# Patient Record
Sex: Male | Born: 1935 | Race: White | Hispanic: No | Smoking: Never smoker
Health system: Southern US, Community
[De-identification: ages and names within clinical notes are randomized; demographics above are authoritative.]

## PROBLEM LIST (undated history)

## (undated) DIAGNOSIS — I10 Essential (primary) hypertension: Secondary | ICD-10-CM

## (undated) DIAGNOSIS — I2510 Atherosclerotic heart disease of native coronary artery without angina pectoris: Secondary | ICD-10-CM

## (undated) DIAGNOSIS — E785 Hyperlipidemia, unspecified: Secondary | ICD-10-CM

## (undated) DIAGNOSIS — H269 Unspecified cataract: Secondary | ICD-10-CM

## (undated) HISTORY — DX: Atherosclerotic heart disease of native coronary artery without angina pectoris: I25.10

## (undated) HISTORY — DX: Essential (primary) hypertension: I10

## (undated) HISTORY — DX: Hyperlipidemia, unspecified: E78.5

## (undated) HISTORY — DX: Unspecified cataract: H26.9

## (undated) HISTORY — PX: RECTAL SURGERY: SHX760

---

## 1980-01-31 HISTORY — PX: HERNIA REPAIR: SHX51

## 2005-11-14 ENCOUNTER — Ambulatory Visit: Admission: RE | Admit: 2005-11-14 | Discharge: 2006-01-09 | Payer: Self-pay | Admitting: Radiation Oncology

## 2011-02-03 DIAGNOSIS — C61 Malignant neoplasm of prostate: Secondary | ICD-10-CM | POA: Diagnosis not present

## 2011-02-10 DIAGNOSIS — R339 Retention of urine, unspecified: Secondary | ICD-10-CM | POA: Diagnosis not present

## 2011-02-10 DIAGNOSIS — C61 Malignant neoplasm of prostate: Secondary | ICD-10-CM | POA: Diagnosis not present

## 2011-02-10 DIAGNOSIS — N401 Enlarged prostate with lower urinary tract symptoms: Secondary | ICD-10-CM | POA: Diagnosis not present

## 2011-06-06 DIAGNOSIS — C61 Malignant neoplasm of prostate: Secondary | ICD-10-CM | POA: Diagnosis not present

## 2011-06-14 DIAGNOSIS — C61 Malignant neoplasm of prostate: Secondary | ICD-10-CM | POA: Diagnosis not present

## 2011-06-14 DIAGNOSIS — R339 Retention of urine, unspecified: Secondary | ICD-10-CM | POA: Diagnosis not present

## 2011-07-31 DIAGNOSIS — R310 Gross hematuria: Secondary | ICD-10-CM | POA: Diagnosis not present

## 2011-07-31 DIAGNOSIS — C61 Malignant neoplasm of prostate: Secondary | ICD-10-CM | POA: Diagnosis not present

## 2011-09-14 DIAGNOSIS — R310 Gross hematuria: Secondary | ICD-10-CM | POA: Diagnosis not present

## 2011-12-12 DIAGNOSIS — Z23 Encounter for immunization: Secondary | ICD-10-CM | POA: Diagnosis not present

## 2011-12-26 DIAGNOSIS — C61 Malignant neoplasm of prostate: Secondary | ICD-10-CM | POA: Diagnosis not present

## 2012-01-02 DIAGNOSIS — R339 Retention of urine, unspecified: Secondary | ICD-10-CM | POA: Diagnosis not present

## 2012-01-02 DIAGNOSIS — C61 Malignant neoplasm of prostate: Secondary | ICD-10-CM | POA: Diagnosis not present

## 2012-02-16 DIAGNOSIS — N529 Male erectile dysfunction, unspecified: Secondary | ICD-10-CM | POA: Diagnosis not present

## 2012-02-16 DIAGNOSIS — E7800 Pure hypercholesterolemia, unspecified: Secondary | ICD-10-CM | POA: Diagnosis not present

## 2012-02-16 DIAGNOSIS — Z79899 Other long term (current) drug therapy: Secondary | ICD-10-CM | POA: Diagnosis not present

## 2012-02-16 DIAGNOSIS — C61 Malignant neoplasm of prostate: Secondary | ICD-10-CM | POA: Diagnosis not present

## 2012-02-16 DIAGNOSIS — M1990 Unspecified osteoarthritis, unspecified site: Secondary | ICD-10-CM | POA: Diagnosis not present

## 2012-06-27 DIAGNOSIS — L570 Actinic keratosis: Secondary | ICD-10-CM | POA: Diagnosis not present

## 2012-06-27 DIAGNOSIS — C44621 Squamous cell carcinoma of skin of unspecified upper limb, including shoulder: Secondary | ICD-10-CM | POA: Diagnosis not present

## 2012-06-27 DIAGNOSIS — L578 Other skin changes due to chronic exposure to nonionizing radiation: Secondary | ICD-10-CM | POA: Diagnosis not present

## 2012-06-27 DIAGNOSIS — L821 Other seborrheic keratosis: Secondary | ICD-10-CM | POA: Diagnosis not present

## 2012-07-03 DIAGNOSIS — C61 Malignant neoplasm of prostate: Secondary | ICD-10-CM | POA: Diagnosis not present

## 2012-07-05 DIAGNOSIS — C44621 Squamous cell carcinoma of skin of unspecified upper limb, including shoulder: Secondary | ICD-10-CM | POA: Diagnosis not present

## 2012-07-10 DIAGNOSIS — C61 Malignant neoplasm of prostate: Secondary | ICD-10-CM | POA: Diagnosis not present

## 2012-07-10 DIAGNOSIS — N401 Enlarged prostate with lower urinary tract symptoms: Secondary | ICD-10-CM | POA: Diagnosis not present

## 2012-10-21 DIAGNOSIS — L570 Actinic keratosis: Secondary | ICD-10-CM | POA: Diagnosis not present

## 2012-11-11 DIAGNOSIS — J069 Acute upper respiratory infection, unspecified: Secondary | ICD-10-CM | POA: Diagnosis not present

## 2013-01-31 DIAGNOSIS — C61 Malignant neoplasm of prostate: Secondary | ICD-10-CM | POA: Diagnosis not present

## 2013-02-05 DIAGNOSIS — C61 Malignant neoplasm of prostate: Secondary | ICD-10-CM | POA: Diagnosis not present

## 2013-02-05 DIAGNOSIS — R339 Retention of urine, unspecified: Secondary | ICD-10-CM | POA: Diagnosis not present

## 2013-08-12 DIAGNOSIS — H2589 Other age-related cataract: Secondary | ICD-10-CM | POA: Diagnosis not present

## 2013-08-13 DIAGNOSIS — C61 Malignant neoplasm of prostate: Secondary | ICD-10-CM | POA: Diagnosis not present

## 2013-08-20 DIAGNOSIS — N139 Obstructive and reflux uropathy, unspecified: Secondary | ICD-10-CM | POA: Diagnosis not present

## 2013-08-20 DIAGNOSIS — N401 Enlarged prostate with lower urinary tract symptoms: Secondary | ICD-10-CM | POA: Diagnosis not present

## 2013-08-20 DIAGNOSIS — C61 Malignant neoplasm of prostate: Secondary | ICD-10-CM | POA: Diagnosis not present

## 2013-08-25 DIAGNOSIS — H52209 Unspecified astigmatism, unspecified eye: Secondary | ICD-10-CM | POA: Diagnosis not present

## 2013-08-25 DIAGNOSIS — H2589 Other age-related cataract: Secondary | ICD-10-CM | POA: Diagnosis not present

## 2013-08-25 DIAGNOSIS — H2510 Age-related nuclear cataract, unspecified eye: Secondary | ICD-10-CM | POA: Diagnosis not present

## 2013-09-01 DIAGNOSIS — H52209 Unspecified astigmatism, unspecified eye: Secondary | ICD-10-CM | POA: Diagnosis not present

## 2013-09-01 DIAGNOSIS — H2510 Age-related nuclear cataract, unspecified eye: Secondary | ICD-10-CM | POA: Diagnosis not present

## 2013-09-01 DIAGNOSIS — H2589 Other age-related cataract: Secondary | ICD-10-CM | POA: Diagnosis not present

## 2013-10-21 DIAGNOSIS — L723 Sebaceous cyst: Secondary | ICD-10-CM | POA: Diagnosis not present

## 2013-10-21 DIAGNOSIS — L821 Other seborrheic keratosis: Secondary | ICD-10-CM | POA: Diagnosis not present

## 2013-10-21 DIAGNOSIS — L820 Inflamed seborrheic keratosis: Secondary | ICD-10-CM | POA: Diagnosis not present

## 2013-10-21 DIAGNOSIS — L570 Actinic keratosis: Secondary | ICD-10-CM | POA: Diagnosis not present

## 2013-12-02 DIAGNOSIS — Z23 Encounter for immunization: Secondary | ICD-10-CM | POA: Diagnosis not present

## 2014-01-12 DIAGNOSIS — R110 Nausea: Secondary | ICD-10-CM | POA: Diagnosis not present

## 2014-01-12 DIAGNOSIS — R079 Chest pain, unspecified: Secondary | ICD-10-CM | POA: Diagnosis not present

## 2014-01-12 DIAGNOSIS — I10 Essential (primary) hypertension: Secondary | ICD-10-CM | POA: Diagnosis not present

## 2014-01-12 DIAGNOSIS — R072 Precordial pain: Secondary | ICD-10-CM | POA: Diagnosis not present

## 2014-01-12 DIAGNOSIS — R0602 Shortness of breath: Secondary | ICD-10-CM | POA: Diagnosis not present

## 2014-01-13 DIAGNOSIS — E785 Hyperlipidemia, unspecified: Secondary | ICD-10-CM | POA: Diagnosis present

## 2014-01-13 DIAGNOSIS — R0602 Shortness of breath: Secondary | ICD-10-CM | POA: Diagnosis not present

## 2014-01-13 DIAGNOSIS — I25119 Atherosclerotic heart disease of native coronary artery with unspecified angina pectoris: Secondary | ICD-10-CM | POA: Diagnosis not present

## 2014-01-13 DIAGNOSIS — I248 Other forms of acute ischemic heart disease: Secondary | ICD-10-CM | POA: Diagnosis not present

## 2014-01-13 DIAGNOSIS — R079 Chest pain, unspecified: Secondary | ICD-10-CM | POA: Diagnosis not present

## 2014-01-13 DIAGNOSIS — I208 Other forms of angina pectoris: Secondary | ICD-10-CM | POA: Diagnosis not present

## 2014-01-13 DIAGNOSIS — I10 Essential (primary) hypertension: Secondary | ICD-10-CM | POA: Diagnosis not present

## 2014-01-13 DIAGNOSIS — R0789 Other chest pain: Secondary | ICD-10-CM | POA: Diagnosis not present

## 2014-01-13 DIAGNOSIS — R110 Nausea: Secondary | ICD-10-CM | POA: Diagnosis not present

## 2014-01-13 DIAGNOSIS — I209 Angina pectoris, unspecified: Secondary | ICD-10-CM | POA: Diagnosis present

## 2014-01-14 DIAGNOSIS — I248 Other forms of acute ischemic heart disease: Secondary | ICD-10-CM | POA: Diagnosis not present

## 2014-01-14 DIAGNOSIS — I25119 Atherosclerotic heart disease of native coronary artery with unspecified angina pectoris: Secondary | ICD-10-CM | POA: Diagnosis not present

## 2014-01-14 DIAGNOSIS — I208 Other forms of angina pectoris: Secondary | ICD-10-CM | POA: Diagnosis not present

## 2014-01-15 DIAGNOSIS — I25119 Atherosclerotic heart disease of native coronary artery with unspecified angina pectoris: Secondary | ICD-10-CM | POA: Diagnosis not present

## 2014-01-15 DIAGNOSIS — I208 Other forms of angina pectoris: Secondary | ICD-10-CM | POA: Diagnosis not present

## 2014-01-19 DIAGNOSIS — C61 Malignant neoplasm of prostate: Secondary | ICD-10-CM | POA: Diagnosis not present

## 2014-01-19 DIAGNOSIS — R7301 Impaired fasting glucose: Secondary | ICD-10-CM | POA: Diagnosis not present

## 2014-01-19 DIAGNOSIS — E780 Pure hypercholesterolemia: Secondary | ICD-10-CM | POA: Diagnosis not present

## 2014-01-19 DIAGNOSIS — Z0000 Encounter for general adult medical examination without abnormal findings: Secondary | ICD-10-CM | POA: Diagnosis not present

## 2014-01-19 DIAGNOSIS — N521 Erectile dysfunction due to diseases classified elsewhere: Secondary | ICD-10-CM | POA: Diagnosis not present

## 2014-01-19 DIAGNOSIS — I25119 Atherosclerotic heart disease of native coronary artery with unspecified angina pectoris: Secondary | ICD-10-CM | POA: Diagnosis not present

## 2014-01-19 DIAGNOSIS — Z79899 Other long term (current) drug therapy: Secondary | ICD-10-CM | POA: Diagnosis not present

## 2014-01-28 DIAGNOSIS — E785 Hyperlipidemia, unspecified: Secondary | ICD-10-CM | POA: Diagnosis not present

## 2014-01-28 DIAGNOSIS — I209 Angina pectoris, unspecified: Secondary | ICD-10-CM | POA: Diagnosis not present

## 2014-01-28 DIAGNOSIS — I2510 Atherosclerotic heart disease of native coronary artery without angina pectoris: Secondary | ICD-10-CM | POA: Diagnosis not present

## 2014-02-10 DIAGNOSIS — H02831 Dermatochalasis of right upper eyelid: Secondary | ICD-10-CM | POA: Diagnosis not present

## 2014-02-10 DIAGNOSIS — H02834 Dermatochalasis of left upper eyelid: Secondary | ICD-10-CM | POA: Diagnosis not present

## 2014-02-17 DIAGNOSIS — C61 Malignant neoplasm of prostate: Secondary | ICD-10-CM | POA: Diagnosis not present

## 2014-02-25 DIAGNOSIS — R3916 Straining to void: Secondary | ICD-10-CM | POA: Diagnosis not present

## 2014-02-25 DIAGNOSIS — N401 Enlarged prostate with lower urinary tract symptoms: Secondary | ICD-10-CM | POA: Diagnosis not present

## 2014-02-25 DIAGNOSIS — C61 Malignant neoplasm of prostate: Secondary | ICD-10-CM | POA: Diagnosis not present

## 2014-03-02 DIAGNOSIS — H02831 Dermatochalasis of right upper eyelid: Secondary | ICD-10-CM | POA: Diagnosis not present

## 2014-03-02 DIAGNOSIS — H02834 Dermatochalasis of left upper eyelid: Secondary | ICD-10-CM | POA: Diagnosis not present

## 2014-06-01 DIAGNOSIS — I2510 Atherosclerotic heart disease of native coronary artery without angina pectoris: Secondary | ICD-10-CM | POA: Diagnosis not present

## 2014-06-01 DIAGNOSIS — E785 Hyperlipidemia, unspecified: Secondary | ICD-10-CM | POA: Diagnosis not present

## 2014-06-03 DIAGNOSIS — E785 Hyperlipidemia, unspecified: Secondary | ICD-10-CM | POA: Diagnosis not present

## 2014-08-26 DIAGNOSIS — C61 Malignant neoplasm of prostate: Secondary | ICD-10-CM | POA: Diagnosis not present

## 2014-09-09 DIAGNOSIS — R339 Retention of urine, unspecified: Secondary | ICD-10-CM | POA: Diagnosis not present

## 2014-09-09 DIAGNOSIS — C61 Malignant neoplasm of prostate: Secondary | ICD-10-CM | POA: Diagnosis not present

## 2014-10-22 DIAGNOSIS — L821 Other seborrheic keratosis: Secondary | ICD-10-CM | POA: Diagnosis not present

## 2014-10-22 DIAGNOSIS — L570 Actinic keratosis: Secondary | ICD-10-CM | POA: Diagnosis not present

## 2014-10-22 DIAGNOSIS — L578 Other skin changes due to chronic exposure to nonionizing radiation: Secondary | ICD-10-CM | POA: Diagnosis not present

## 2014-12-07 DIAGNOSIS — H04223 Epiphora due to insufficient drainage, bilateral lacrimal glands: Secondary | ICD-10-CM | POA: Diagnosis not present

## 2014-12-07 DIAGNOSIS — H02102 Unspecified ectropion of right lower eyelid: Secondary | ICD-10-CM | POA: Diagnosis not present

## 2014-12-17 DIAGNOSIS — I2510 Atherosclerotic heart disease of native coronary artery without angina pectoris: Secondary | ICD-10-CM | POA: Diagnosis not present

## 2014-12-17 DIAGNOSIS — I208 Other forms of angina pectoris: Secondary | ICD-10-CM | POA: Diagnosis not present

## 2014-12-17 DIAGNOSIS — E785 Hyperlipidemia, unspecified: Secondary | ICD-10-CM | POA: Diagnosis not present

## 2014-12-28 DIAGNOSIS — L574 Cutis laxa senilis: Secondary | ICD-10-CM | POA: Diagnosis not present

## 2014-12-28 DIAGNOSIS — H02102 Unspecified ectropion of right lower eyelid: Secondary | ICD-10-CM | POA: Diagnosis not present

## 2015-03-19 DIAGNOSIS — C61 Malignant neoplasm of prostate: Secondary | ICD-10-CM | POA: Diagnosis not present

## 2015-03-26 DIAGNOSIS — C61 Malignant neoplasm of prostate: Secondary | ICD-10-CM | POA: Diagnosis not present

## 2015-03-26 DIAGNOSIS — R3914 Feeling of incomplete bladder emptying: Secondary | ICD-10-CM | POA: Diagnosis not present

## 2015-03-26 DIAGNOSIS — Z0000 Encounter for general adult medical examination without abnormal findings: Secondary | ICD-10-CM | POA: Diagnosis not present

## 2015-03-26 DIAGNOSIS — N401 Enlarged prostate with lower urinary tract symptoms: Secondary | ICD-10-CM | POA: Diagnosis not present

## 2015-03-26 DIAGNOSIS — R338 Other retention of urine: Secondary | ICD-10-CM | POA: Diagnosis not present

## 2015-06-15 DIAGNOSIS — Z0000 Encounter for general adult medical examination without abnormal findings: Secondary | ICD-10-CM | POA: Diagnosis not present

## 2015-06-15 DIAGNOSIS — N390 Urinary tract infection, site not specified: Secondary | ICD-10-CM | POA: Diagnosis not present

## 2015-06-15 DIAGNOSIS — R310 Gross hematuria: Secondary | ICD-10-CM | POA: Diagnosis not present

## 2015-07-12 DIAGNOSIS — E785 Hyperlipidemia, unspecified: Secondary | ICD-10-CM | POA: Diagnosis not present

## 2015-07-12 DIAGNOSIS — I2510 Atherosclerotic heart disease of native coronary artery without angina pectoris: Secondary | ICD-10-CM | POA: Diagnosis not present

## 2015-07-12 DIAGNOSIS — I208 Other forms of angina pectoris: Secondary | ICD-10-CM | POA: Diagnosis not present

## 2015-08-23 DIAGNOSIS — R310 Gross hematuria: Secondary | ICD-10-CM | POA: Diagnosis not present

## 2015-08-25 DIAGNOSIS — N200 Calculus of kidney: Secondary | ICD-10-CM | POA: Diagnosis not present

## 2015-08-25 DIAGNOSIS — R310 Gross hematuria: Secondary | ICD-10-CM | POA: Diagnosis not present

## 2015-09-15 ENCOUNTER — Telehealth: Payer: Self-pay | Admitting: Internal Medicine

## 2015-09-15 NOTE — Telephone Encounter (Signed)
Received records from [facility] for appointment on 09/29/15 with Dr Debara Pickett.  Records given to [facility] (medical records) for Dr [facility] schedule on 09/29/15. lp

## 2015-09-22 DIAGNOSIS — C61 Malignant neoplasm of prostate: Secondary | ICD-10-CM | POA: Diagnosis not present

## 2015-09-29 ENCOUNTER — Encounter: Payer: Self-pay | Admitting: Internal Medicine

## 2015-09-29 ENCOUNTER — Telehealth: Payer: Self-pay | Admitting: Internal Medicine

## 2015-09-29 ENCOUNTER — Encounter (INDEPENDENT_AMBULATORY_CARE_PROVIDER_SITE_OTHER): Payer: Self-pay

## 2015-09-29 ENCOUNTER — Ambulatory Visit (INDEPENDENT_AMBULATORY_CARE_PROVIDER_SITE_OTHER): Payer: Medicare Other | Admitting: Internal Medicine

## 2015-09-29 VITALS — BP 112/70 | HR 58 | Ht 69.0 in | Wt 197.0 lb

## 2015-09-29 DIAGNOSIS — I2510 Atherosclerotic heart disease of native coronary artery without angina pectoris: Secondary | ICD-10-CM | POA: Insufficient documentation

## 2015-09-29 DIAGNOSIS — I712 Thoracic aortic aneurysm, without rupture, unspecified: Secondary | ICD-10-CM

## 2015-09-29 DIAGNOSIS — Z79899 Other long term (current) drug therapy: Secondary | ICD-10-CM

## 2015-09-29 DIAGNOSIS — R338 Other retention of urine: Secondary | ICD-10-CM | POA: Diagnosis not present

## 2015-09-29 DIAGNOSIS — E785 Hyperlipidemia, unspecified: Secondary | ICD-10-CM

## 2015-09-29 DIAGNOSIS — R310 Gross hematuria: Secondary | ICD-10-CM | POA: Diagnosis not present

## 2015-09-29 DIAGNOSIS — N401 Enlarged prostate with lower urinary tract symptoms: Secondary | ICD-10-CM | POA: Diagnosis not present

## 2015-09-29 DIAGNOSIS — C61 Malignant neoplasm of prostate: Secondary | ICD-10-CM | POA: Diagnosis not present

## 2015-09-29 HISTORY — DX: Thoracic aortic aneurysm, without rupture, unspecified: I71.20

## 2015-09-29 HISTORY — DX: Thoracic aortic aneurysm, without rupture: I71.2

## 2015-09-29 HISTORY — DX: Atherosclerotic heart disease of native coronary artery without angina pectoris: I25.10

## 2015-09-29 NOTE — Progress Notes (Signed)
OFFICE NOTE  Chief Complaint:  Establish cardiologist  Primary Care Physician: Pcp Not In System  HPI:  Chad Byrd is a 82 y.o. male who was previously followed by Dr. Danie Binder with [facility] regional physicians. In December 2015 he presented with acute chest pain and underwent cardiac catheterization. This demonstrated severe proximal circumflex stenosis that was high risk for intervention at a bifurcation. At the time he was on no medication and was placed on a statin, long-acting nitrate and metoprolol. Since then he has been chest pain-free. Unfortunately he had significant side effects including severe muscle weakness and fatigue from pravastatin and discontinued this medication. In the past he's been able to take red [place] without any problems. Recently he has had problems with some hematuria and saw Dr. Alinda Money with urology who ordered a CT of the abdomen and pelvis. This demonstrated aneurysmal dilatation of the descending thoracic aorta measuring 4.9 cm in diameter. The entire thoracic or aorta was not visualized as this was an abdominal study, and it was recommended that repeat CT angiography or MRA would be performed in 6-12 months. 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 He is not get any more short of breath or have any chest pain symptoms with exertion. He reports good control over his blood pressure. He's not had lipid testing in some time.  PMHx:  Past Medical History:  Diagnosis Date  . Cataract   . Hyperlipidemia     Past Surgical History:  Procedure Laterality Date  . HERNIA REPAIR  1982  . RECTAL SURGERY  mid 1990s    FAMHx:  History reviewed. No pertinent family history. Parents died of "old age"  SOCHx:   reports that he has never smoked. He has never used smokeless tobacco. He reports that he drinks alcohol. He reports that he does not use drugs.  ALLERGIES:  No Known Allergies  ROS: Pertinent  items noted in HPI and remainder of comprehensive ROS otherwise negative.  HOME MEDS: Current Outpatient Prescriptions  Medication Sig Dispense Refill  . aspirin EC 81 MG tablet Take 81 mg by mouth daily.    . isosorbide mononitrate (IMDUR) 30 MG 24 hr tablet Take 30 mg by mouth daily.    . metoprolol succinate (TOPROL-XL) 25 MG 24 hr tablet Take 25 mg by mouth daily.     No current facility-administered medications for this visit.     LABS/IMAGING: No results found for this or any previous visit (from the past 48 hour(s)). No results found.  WEIGHTS: Wt Readings from Last 3 Encounters:  09/29/15 197 lb (89.4 kg)    VITALS: BP 112/70   Pulse (!) 58   Ht 5\' 9"  (1.753 m)   Wt 197 lb (89.4 kg)   BMI 29.09 kg/m   EXAM: General appearance: alert and no distress Neck: no carotid bruit and no JVD Lungs: clear to auscultation bilaterally Heart: regular rate and rhythm Abdomen: soft, non-tender; bowel sounds normal; no masses,  no organomegaly Extremities: extremities normal, atraumatic, no cyanosis or edema Pulses: 2+ and symmetric Skin: Skin color, texture, turgor normal. No rashes or lesions Neurologic: Grossly normal Psych: Pleasant  EKG: Sinus bradycardia 58, left anterior fascicular block, minimal voltage criteria for LVH, nonspecific ST and T-wave changes.  ASSESSMENT: 1. Coronary artery disease with moderate to severe circumflex bifurcation disease (12/2013) managed medically 2. Thoracic aortic aneurysm measuring 4.9 cm 3. Dyslipidemia-statin intolerant  PLAN: 1.   Mr. Kuczek  has medically managed coronary artery disease and has done very well since his car a catheterization in 2015. Unfortunately he has had intolerance to statin medications and has likely not been optimized with regards to his cholesterol. I like to recheck a lipid profile and would probably recommend adding steady. He could take red [place] rice and she tolerated this as well. Ultimately, he may be  candidate for PCSK9 therapy. With regards to his thoracic aortic aneurysm, this was visualized on an abdominal CT and therefore he will need complete imaging of the chest. We'll plan this in 6 months since we don't know the timeline of his aneurysm change and to more closely assess the ascending thoracic aorta. Follow-up with me at that time.  Thanks for the kind referral.  Pixie Casino, MD, [facility] Attending Cardiologist [place] 09/29/2015, 1:05 PM

## 2015-09-29 NOTE — Patient Instructions (Addendum)
Your physician recommends that you return for lab work FASTING to check cholesterol, comprehensive metabolic panel   Dr. Debara Pickett has ordered a CT test to be done in 6 months @ [address].   Your physician wants you to follow-up in: 6 months with Dr. Debara Pickett - after your CT.  You will receive a reminder letter in the mail two months in advance. If you don't receive a letter, please call our office to schedule the follow-up appointment.

## 2015-09-29 NOTE — Telephone Encounter (Signed)
Faxed Release, signed by patient, to [facility] to obtain records per Dr [facility] request.

## 2015-10-01 DIAGNOSIS — Z79899 Other long term (current) drug therapy: Secondary | ICD-10-CM | POA: Diagnosis not present

## 2015-10-01 DIAGNOSIS — I2510 Atherosclerotic heart disease of native coronary artery without angina pectoris: Secondary | ICD-10-CM | POA: Diagnosis not present

## 2015-10-01 LAB — COMPREHENSIVE METABOLIC PANEL
ALT: 16 U/L (ref 9–46)
AST: 22 U/L (ref 10–35)
Albumin: 3.9 g/dL (ref 3.6–5.1)
Alkaline Phosphatase: 53 U/L (ref 40–115)
BUN: 18 mg/dL (ref 7–25)
CHLORIDE: 104 mmol/L (ref 98–110)
CO2: 26 mmol/L (ref 20–31)
Calcium: 9.1 mg/dL (ref 8.6–10.3)
Creat: 1.3 mg/dL — ABNORMAL HIGH (ref 0.70–1.11)
Glucose, Bld: 87 mg/dL (ref 65–99)
POTASSIUM: 5.2 mmol/L (ref 3.5–5.3)
Sodium: 141 mmol/L (ref 135–146)
TOTAL PROTEIN: 6.3 g/dL (ref 6.1–8.1)
Total Bilirubin: 0.5 mg/dL (ref 0.2–1.2)

## 2015-10-05 LAB — CARDIO IQ(R) ADVANCED LIPID PANEL
Apolipoprotein B: 122 mg/dL — ABNORMAL HIGH (ref 52–109)
CHOLESTEROL, TOTAL (CARDIO IQ ADV LIPID PANEL): 203 mg/dL — AB (ref <200)
CHOLESTEROL/HDL RATIO (CARDIO IQ ADV LIPID PANEL): 5.6 calc — AB (ref <5.0)
HDL Cholesterol: 36 mg/dL — ABNORMAL LOW (ref >40)
LDL CHOLESTEROL CALCULATED (CARDIO IQ ADV LIPID PANEL): 142 mg/dL — AB (ref <100)
LDL Large: 4918 nmol/L (ref 4334–10815)
LDL Medium: 384 nmol/L (ref 167–465)
LDL PEAK SIZE: 211.5 Angstrom — AB (ref >=218.2)
LDL Particle Number: 2008 nmol/L (ref 1016–2185)
LDL SMALL: 567 nmol/L — AB (ref 123–441)
Lipoprotein (a): 76 nmol/L — ABNORMAL HIGH (ref <75)
NON-HDL CHOLESTEROL (CARDIO IQ ADV LIPID PANEL): 167 mg/dL — AB (ref <130)
TRIGLYCERIDES (CARDIO IQ ADV LIPID PANEL): 127 mg/dL (ref <150)

## 2015-10-06 ENCOUNTER — Other Ambulatory Visit: Payer: Self-pay | Admitting: *Deleted

## 2015-10-06 ENCOUNTER — Encounter: Payer: Self-pay | Admitting: Internal Medicine

## 2015-10-06 DIAGNOSIS — E785 Hyperlipidemia, unspecified: Secondary | ICD-10-CM

## 2015-10-06 MED ORDER — EZETIMIBE 10 MG PO TABS: 10.0000 mg | ORAL_TABLET | Freq: Every day | ORAL | 5 refills | Status: DC

## 2015-10-06 NOTE — Progress Notes (Signed)
Patient was not scheduled to come back for 6 months after his CT. Note mentioned he may be PCSK-9 candidate?

## 2015-10-08 ENCOUNTER — Telehealth: Payer: Self-pay | Admitting: Internal Medicine

## 2015-10-08 NOTE — Telephone Encounter (Signed)
Received records from [facility] as requested by Dr Debara Pickett.  Records given to Dr Debara Pickett for review. lp

## 2015-10-21 DIAGNOSIS — L821 Other seborrheic keratosis: Secondary | ICD-10-CM | POA: Diagnosis not present

## 2015-10-21 DIAGNOSIS — L918 Other hypertrophic disorders of the skin: Secondary | ICD-10-CM | POA: Diagnosis not present

## 2015-10-21 DIAGNOSIS — L578 Other skin changes due to chronic exposure to nonionizing radiation: Secondary | ICD-10-CM | POA: Diagnosis not present

## 2015-10-21 DIAGNOSIS — L570 Actinic keratosis: Secondary | ICD-10-CM | POA: Diagnosis not present

## 2016-03-06 ENCOUNTER — Ambulatory Visit
Admission: RE | Admit: 2016-03-06 | Discharge: 2016-03-06 | Disposition: A | Discharge status: Home / Self Care | Payer: Medicare Other | Source: Ambulatory Visit | Attending: Internal Medicine | Admitting: Internal Medicine

## 2016-03-06 DIAGNOSIS — I712 Thoracic aortic aneurysm, without rupture, unspecified: Secondary | ICD-10-CM

## 2016-03-06 MED ORDER — IOPAMIDOL (ISOVUE-370) INJECTION 76%
80.0000 mL | Freq: Once | INTRAVENOUS | Status: AC | PRN
  Administered 2016-03-06: 80 mL via INTRAVENOUS

## 2016-03-07 ENCOUNTER — Ambulatory Visit (INDEPENDENT_AMBULATORY_CARE_PROVIDER_SITE_OTHER): Payer: Medicare Other | Admitting: Internal Medicine

## 2016-03-07 ENCOUNTER — Encounter: Payer: Self-pay | Admitting: Internal Medicine

## 2016-03-07 VITALS — BP 142/70 | HR 56 | Ht 69.0 in | Wt 204.0 lb

## 2016-03-07 DIAGNOSIS — I712 Thoracic aortic aneurysm, without rupture, unspecified: Secondary | ICD-10-CM

## 2016-03-07 DIAGNOSIS — Z789 Other specified health status: Secondary | ICD-10-CM | POA: Diagnosis not present

## 2016-03-07 DIAGNOSIS — E782 Mixed hyperlipidemia: Secondary | ICD-10-CM

## 2016-03-07 DIAGNOSIS — I2510 Atherosclerotic heart disease of native coronary artery without angina pectoris: Secondary | ICD-10-CM | POA: Diagnosis not present

## 2016-03-07 HISTORY — DX: Other specified health status: Z78.9

## 2016-03-07 MED ORDER — LISINOPRIL 10 MG PO TABS: 10.0000 mg | ORAL_TABLET | Freq: Every day | ORAL | 3 refills | Status: DC

## 2016-03-07 NOTE — Progress Notes (Signed)
OFFICE NOTE  Chief Complaint:  Establish cardiologist  Primary Care Physician: Pcp Not In System  HPI:  Chad Byrd is a 83 y.o. male who was previously followed by Dr. Danie Binder with [facility] regional physicians. In December 2015 he presented with acute chest pain and underwent cardiac catheterization. This demonstrated severe proximal circumflex stenosis that was high risk for intervention at a bifurcation. At the time he was on no medication and was placed on a statin, long-acting nitrate and metoprolol. Since then he has been chest pain-free. Unfortunately he had significant side effects including severe muscle weakness and fatigue from pravastatin and discontinued this medication. In the past he's been able to take red [place] rice without any problems. Recently he has had problems with some hematuria and saw Dr. Alinda Money with urology who ordered a CT of the abdomen and pelvis. This demonstrated aneurysmal dilatation of the descending thoracic aorta measuring 4.9 cm in diameter. The entire thoracic or aorta was not visualized as this was an abdominal study, and it was recommended that repeat CT angiography or MRA would be performed in 6-12 months. 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 He is not get any more short of breath or have any chest pain symptoms with exertion. He reports good control over his blood pressure. He's not had lipid testing in some time.  03/07/2016  Chad Byrd returns today for follow-up. He had a repeat CT scan demonstrating interval decrease in the size of his ascending thoracic aortic aneurysm from 4.9 cm to 4.4 cm. I suspect this is due to mis-measurement. Blood pressure was noted to be somewhat elevated today 142/70. Given his aneurysm, strict blood pressure control is important. He is on metoprolol succinate 25 mg daily. He has a history of statin intolerance failing 8 atorvastatin and simvastatin in the past.  I started him on ezetimibe and he had a predictable decrease in LDL cholesterol to 111 based on recent testing. This is however very far from goal LDL of less than 70 or lower.  PMHx:  Past Medical History:  Diagnosis Date  . Cataract   . Hyperlipidemia     Past Surgical History:  Procedure Laterality Date  . HERNIA REPAIR  1982  . RECTAL SURGERY  mid 1990s    FAMHx:  Family History  Problem Relation Age of Onset  . Alzheimer's disease Brother   . Lung cancer Sister    Parents died of "old age"  SOCHx:   reports that he has never smoked. He has never used smokeless tobacco. He reports that he drinks alcohol. He reports that he does not use drugs.  ALLERGIES:  No Known Allergies  ROS: Pertinent items noted in HPI and remainder of comprehensive ROS otherwise negative.  HOME MEDS: Current Outpatient Prescriptions  Medication Sig Dispense Refill  . aspirin EC 81 MG tablet Take 81 mg by mouth daily.    [name] ezetimibe (ZETIA) 10 MG tablet Take 10 mg by mouth daily.    . isosorbide mononitrate (IMDUR) 30 MG 24 hr tablet Take 30 mg by mouth daily.    . metoprolol succinate (TOPROL-XL) 25 MG 24 hr tablet Take 25 mg by mouth daily.    [name] lisinopril (PRINIVIL,ZESTRIL) 10 MG tablet Take 1 tablet (10 mg total) by mouth daily. 90 tablet 3   No current facility-administered medications for this visit.     LABS/IMAGING: No results found for this or any previous visit (from the  past 48 hour(s)). Ct Angio Chest Aorta W &/or Wo Contrast  Result Date: 03/06/2016 CLINICAL DATA:  Thoracic aortic aneurysm, follow-up. EXAM: CT ANGIOGRAPHY CHEST WITH CONTRAST TECHNIQUE: Multidetector CT imaging of the chest was performed using the standard protocol during bolus administration of intravenous contrast. Multiplanar CT image reconstructions and MIPs were obtained to evaluate the vascular anatomy. CONTRAST:  80 cc Isovue 370 IV COMPARISON:  None. FINDINGS: Cardiovascular: Mild aneurysmal dilatation of  the ascending thoracic aorta, measuring maximally 4.4 cm. Tortuosity of the thoracic aorta. Mild-to-moderate calcified and noncalcified plaque in the descending thoracic aorta without aneurysm. No filling defects in the pulmonary arteries to suggest pulmonary emboli. Heart is borderline in size. Scattered moderate coronary artery calcifications. Mediastinum/Nodes: Calcified mediastinal and right hilar lymph nodes compatible with old granulomatous disease. No adenopathy. Lungs/Pleura: Calcified granuloma in the right middle lobe. No suspicious pulmonary nodules. No confluent opacities or effusions. Upper Abdomen: No acute findings in the upper abdomen. Few small layering gallstones in the gallbladder. Musculoskeletal: Chest wall soft tissues are unremarkable. No acute bony abnormality. Review of the MIP images confirms the above findings. IMPRESSION: 4.4 cm ascending thoracic aortic aneurysm. Recommend annual imaging followup by CTA or MRA. This recommendation follows 2010 ACCF/AHA/AATS/ACR/ASA/SCA/SCAI/SIR/STS/SVM Guidelines for the Diagnosis and Management of Patients with Thoracic Aortic Disease. Circulation. 2010; 121: [id number] Old granulomatous disease. Moderate coronary artery disease. Electronically Signed   By: Rolm Baptise M.D.   On: 03/06/2016 12:05    WEIGHTS: Wt Readings from Last 3 Encounters:  03/07/16 204 lb (92.5 kg)  09/29/15 197 lb (89.4 kg)    VITALS: BP (!) 142/70   Pulse (!) 56   Ht 5\' 9"  (1.753 m)   Wt 204 lb (92.5 kg)   BMI 30.13 kg/m   EXAM: General appearance: alert and no distress Neck: no carotid bruit and no JVD Lungs: clear to auscultation bilaterally Heart: regular rate and rhythm Abdomen: soft, non-tender; bowel sounds normal; no masses,  no organomegaly Extremities: extremities normal, atraumatic, no cyanosis or edema Pulses: 2+ and symmetric Skin: Skin color, texture, turgor normal. No rashes or lesions Neurologic: Grossly normal Psych:  Pleasant  EKG: Sinus bradycardia 56  ASSESSMENT: 1. Coronary artery disease with moderate to severe circumflex bifurcation disease (12/2013) managed medically 2. Thoracic aortic aneurysm measuring 4.4 cm (2018) 3. Dyslipidemia-statin intolerant  PLAN: 1.   Chad Byrd has known coronary artery disease and thoracic aortic aneurysm and unfortunate has been statin intolerant. He seems to tolerate ezetimibe however LDL-C is 111. He would clearly benefit from the addition of a PCSK9 inhibitor or possibly enrollment in the [place]-10 trial, which randomizes patients to inclisirin (siRNA to PSCK9) versus placebo. I discussed the trial further with him today and he seems agreeable to participate. We will refer him to our research nurses. Finally, as blood pressures not at goal, will add lisinopril 10 mg daily to his regimen. Repeat blood pressure during a return office visit in one month with me.  Pixie Casino, MD, [facility] Attending Cardiologist [place] 03/07/2016, 5:49 PM

## 2016-03-07 NOTE — Patient Instructions (Signed)
Your physician has recommended you make the following change in your medication: START lisinopril 10mg  once daily  Your physician recommends that you schedule a follow-up appointment in: [place] with Dr. Debara Pickett

## 2016-03-22 DIAGNOSIS — C61 Malignant neoplasm of prostate: Secondary | ICD-10-CM | POA: Diagnosis not present

## 2016-03-29 DIAGNOSIS — R350 Frequency of micturition: Secondary | ICD-10-CM | POA: Diagnosis not present

## 2016-03-29 DIAGNOSIS — N312 Flaccid neuropathic bladder, not elsewhere classified: Secondary | ICD-10-CM | POA: Diagnosis not present

## 2016-03-29 DIAGNOSIS — C61 Malignant neoplasm of prostate: Secondary | ICD-10-CM | POA: Diagnosis not present

## 2016-03-29 DIAGNOSIS — N401 Enlarged prostate with lower urinary tract symptoms: Secondary | ICD-10-CM | POA: Diagnosis not present

## 2016-04-06 ENCOUNTER — Encounter: Payer: Self-pay | Admitting: Internal Medicine

## 2016-04-06 ENCOUNTER — Ambulatory Visit (INDEPENDENT_AMBULATORY_CARE_PROVIDER_SITE_OTHER): Payer: Medicare Other | Admitting: Internal Medicine

## 2016-04-06 VITALS — BP 100/62 | HR 65 | Ht 69.5 in | Wt 200.6 lb

## 2016-04-06 DIAGNOSIS — E782 Mixed hyperlipidemia: Secondary | ICD-10-CM | POA: Diagnosis not present

## 2016-04-06 DIAGNOSIS — Z789 Other specified health status: Secondary | ICD-10-CM

## 2016-04-06 DIAGNOSIS — I2510 Atherosclerotic heart disease of native coronary artery without angina pectoris: Secondary | ICD-10-CM

## 2016-04-06 DIAGNOSIS — I712 Thoracic aortic aneurysm, without rupture, unspecified: Secondary | ICD-10-CM

## 2016-04-06 MED ORDER — LISINOPRIL 5 MG PO TABS: 5.0000 mg | ORAL_TABLET | Freq: Every day | ORAL | 3 refills | Status: DC

## 2016-04-06 NOTE — Progress Notes (Signed)
OFFICE NOTE  Chief Complaint:  Follow-up  Primary Care Physician: Pcp Not In System  HPI:  Chad Byrd is a 83 y.o. male who was previously followed by Dr. Danie Binder with [facility] regional physicians. In December 2015 he presented with acute chest pain and underwent cardiac catheterization. This demonstrated severe proximal circumflex stenosis that was high risk for intervention at a bifurcation. At the time he was on no medication and was placed on a statin, long-acting nitrate and metoprolol. Since then he has been chest pain-free. Unfortunately he had significant side effects including severe muscle weakness and fatigue from pravastatin and discontinued this medication. In the past he's been able to take red [place] rice without any problems. Recently he has had problems with some hematuria and saw Dr. Alinda Money with urology who ordered a CT of the abdomen and pelvis. This demonstrated aneurysmal dilatation of the descending thoracic aorta measuring 4.9 cm in diameter. The entire thoracic or aorta was not visualized as this was an abdominal study, and it was recommended that repeat CT angiography or MRA would be performed in 6-12 months. 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 He is not get any more short of breath or have any chest pain symptoms with exertion. He reports good control over his blood pressure. He's not had lipid testing in some time.  03/07/2016  Chad Byrd returns today for follow-up. He had a repeat CT scan demonstrating interval decrease in the size of his ascending thoracic aortic aneurysm from 4.9 cm to 4.4 cm. I suspect this is due to mis-measurement. Blood pressure was noted to be somewhat elevated today 142/70. Given his aneurysm, strict blood pressure control is important. He is on metoprolol succinate 25 mg daily. He has a history of statin intolerance failing 8 atorvastatin and simvastatin in the past. I started  him on ezetimibe and he had a predictable decrease in LDL cholesterol to 111 based on recent testing. This is however very far from goal LDL of less than 70 or lower.  04/06/2016  Chad Byrd was seen today in follow-up. Unfortunately did not qualify for the [place] 10 trial but he is cholesterol remains elevated. He was placed on ezetimibe and he was noted to be recently much higher than goal. I would like to recheck a lipid profile to see where he is at, but I suspect that he would be a good candidate for PCI skin 9 inhibitor. Blood pressure today is lower than expected with the initiation of 10 mg of lisinopril. He also is reported a little bit of increased fatigue but no dizziness or presyncope.  PMHx:  Past Medical History:  Diagnosis Date  . Cataract   . Hyperlipidemia     Past Surgical History:  Procedure Laterality Date  . HERNIA REPAIR  1982  . RECTAL SURGERY  mid 1990s    FAMHx:  Family History  Problem Relation Age of Onset  . Alzheimer's disease Brother   . Lung cancer Sister    Parents died of "old age"  SOCHx:   reports that he has never smoked. He has never used smokeless tobacco. He reports that he drinks alcohol. He reports that he does not use drugs.  ALLERGIES:  Allergies  Allergen Reactions  . Atorvastatin Other (See Comments)    Myalgias   . Simvastatin Other (See Comments)    Myalgias     ROS: Pertinent items noted in HPI and remainder of comprehensive  ROS otherwise negative.  HOME MEDS: Current Outpatient Prescriptions  Medication Sig Dispense Refill  . aspirin EC 81 MG tablet Take 81 mg by mouth daily.    [name] ezetimibe (ZETIA) 10 MG tablet Take 10 mg by mouth daily.    . isosorbide mononitrate (IMDUR) 30 MG 24 hr tablet Take 30 mg by mouth daily.    [name] lisinopril (PRINIVIL,ZESTRIL) 10 MG tablet Take 1 tablet (10 mg total) by mouth daily. 90 tablet 3  . metoprolol succinate (TOPROL-XL) 25 MG 24 hr tablet Take 25 mg by mouth daily.     No current  facility-administered medications for this visit.     LABS/IMAGING: No results found for this or any previous visit (from the past 48 hour(s)). No results found.  WEIGHTS: Wt Readings from Last 3 Encounters:  04/06/16 200 lb 9.6 oz (91 kg)  03/07/16 204 lb (92.5 kg)  09/29/15 197 lb (89.4 kg)    VITALS: BP 100/62   Pulse 65   Ht 5' 9.5" (1.765 m)   Wt 200 lb 9.6 oz (91 kg)   SpO2 95%   BMI 29.20 kg/m   EXAM: Deferred  EKG: Deferred  ASSESSMENT: 1. Coronary artery disease with moderate to severe circumflex bifurcation disease (12/2013) managed medically 2. Thoracic aortic aneurysm measuring 4.4 cm (2018) 3. Dyslipidemia-statin intolerant 4. Essential hypertension  PLAN: 1.   Mr. Chad Byrd seems to have had the low to low normal blood pressures with the addition of lisinopril 10 mg per decrease that to 5 mg daily, benefiting both blood pressure and his thoracic aneurysm. We'll need to recheck a lipid profile today. If he is not at goal LDL less than 70, I would advocate for addition of a PC SK 9 inhibitor to decrease the likelihood of progressive coronary artery disease. Follow-up with me in 6 months.  Pixie Casino, MD, [facility] Attending Cardiologist [place] 04/06/2016, 11:17 AM

## 2016-04-06 NOTE — Patient Instructions (Signed)
DECREASE lisinopril to 5mg  daily  Your physician recommends that you return for lab work FASTING (lipid)  Your physician wants you to follow-up in: [place] with Dr. Debara Pickett. You will receive a reminder letter in the mail two months in advance. If you don't receive a letter, please call our office to schedule the follow-up appointment.

## 2016-04-10 DIAGNOSIS — E782 Mixed hyperlipidemia: Secondary | ICD-10-CM | POA: Diagnosis not present

## 2016-04-11 LAB — LIPID PANEL
CHOLESTEROL TOTAL: 171 mg/dL (ref 100–199)
Chol/HDL Ratio: 4.3 ratio units (ref 0.0–5.0)
HDL: 40 mg/dL (ref >39)
LDL Calculated: 98 mg/dL (ref 0–99)
TRIGLYCERIDES: 167 mg/dL — AB (ref 0–149)
VLDL Cholesterol Cal: 33 mg/dL (ref 5–40)

## 2016-04-13 ENCOUNTER — Telehealth: Payer: Self-pay | Admitting: Internal Medicine

## 2016-04-13 NOTE — Telephone Encounter (Signed)
LMTCB

## 2016-04-13 NOTE — Telephone Encounter (Signed)
New message   Pt wife calling for his testing results

## 2016-04-14 NOTE — Telephone Encounter (Signed)
Returning your call. °

## 2016-04-14 NOTE — Telephone Encounter (Signed)
Spoke with [name] and provided lab results. OK to refer to lipid clinic. 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 Staff message sent to scheduler to arrange appt.

## 2016-04-27 ENCOUNTER — Ambulatory Visit (INDEPENDENT_AMBULATORY_CARE_PROVIDER_SITE_OTHER): Payer: Medicare Other | Admitting: Pharmacist

## 2016-04-27 DIAGNOSIS — I2510 Atherosclerotic heart disease of native coronary artery without angina pectoris: Secondary | ICD-10-CM

## 2016-04-27 DIAGNOSIS — E782 Mixed hyperlipidemia: Secondary | ICD-10-CM

## 2016-04-27 NOTE — Patient Instructions (Signed)
**  Pharmacist Clinic [phone]** **Chad Byrd/Chad Byrd**   Cholesterol Cholesterol is a fat. Your body needs a small amount of cholesterol. Cholesterol (plaque) may build up in your blood vessels (arteries). That makes you more likely to have a heart attack or stroke. You cannot feel your cholesterol level. Having a blood test is the only way to find out if your level is high. Keep your test results. Work with your doctor to keep your cholesterol at a good level. What do the results mean?  Total cholesterol is how much cholesterol is in your blood.  LDL is bad cholesterol. This is the type that can build up. Try to have low LDL.  HDL is good cholesterol. It cleans your blood vessels and carries LDL away. Try to have high HDL.  Triglycerides are fat that the body can store or burn for energy. What are good levels of cholesterol?  Total cholesterol below 200.  LDL below 100 is good for people who have health risks. LDL below 70 is good for people who have very high risks.  HDL above 40 is good. It is best to have HDL of 60 or higher.  Triglycerides below 150. How can I lower my cholesterol? Diet  Follow your diet program as told by your doctor.  Choose fish, white meat chicken, or [place] that is roasted or baked. Try not to eat red meat, fried foods, sausage, or lunch meats.  Eat lots of fresh fruits and vegetables.  Choose whole grains, beans, pasta, potatoes, and cereals.  Choose olive oil, corn oil, or canola oil. Only use small amounts.  Try not to eat butter, mayonnaise, shortening, or palm kernel oils.  Try not to eat foods with trans fats.  Choose low-fat or nonfat dairy foods.  Drink skim or nonfat milk.  Eat low-fat or nonfat yogurt and cheeses.  Try not to drink whole milk or cream.  Try not to eat ice cream, egg yolks, or full-fat cheeses.  Healthy desserts include angel food cake, ginger snaps, animal crackers, hard candy, popsicles, and low-fat or nonfat  frozen yogurt. Try not to eat pastries, cakes, pies, and cookies. Exercise  Follow your exercise program as told by your doctor.  Be more active. Try gardening, walking, and taking the stairs.  Ask your doctor about ways that you can be more active. Medicine  Take over-the-counter and prescription medicines only as told by your doctor. This information is not intended to replace advice given to you by your health care provider. Make sure you discuss any questions you have with your health care provider. Document Released: 04/14/2008 Document Revised: 08/18/2015 Document Reviewed: 07/29/2015 Elsevier Interactive Patient Education  2017 [employer].

## 2016-04-27 NOTE — Progress Notes (Signed)
Patient ID: [name]                 DOB: 10-12-1928                    MRN: [id number]     HPI:  Chad Byrd is a 83 y.o. male patient referred to lipid clinic by Dr Debara Pickett.  PMH includes CAD with severe circumflex bifurcation disease (dx 2015 and managed medically due to high risk for intervention), thoracic aortic aneurysm, and dyslipidemia.  Patient did not qualified for [place]-10 study and his LDL remains above desired goal of <70. Presents today for lipid clinic and assessment for possible PCSK9i as add on to ezetimibe 10mg  daily.  Current Medications: ezetimibe 10mg  daily   Intolerances:  Pravastatin 20mg  - severe muscle weakness, weakness and fatigue Atorvastatin 40mg  -extreme fatigue  Red yeast rice  Risk Factors: CAD with LDL >70; hypertension, 83yo  LDL goal: <70  Diet: minimal fried food, home-cooked , lot of fruits, no vegetables,   Exercise: Farm work and Merchandiser, retail  Family History: Brother required CABG;. Parents died of "old age"  Social History: reports that he has never smoked. He has never used smokeless tobacco. He reports that he drinks alcohol. He reports that he does not use drugs.  Labs: CHO 171; HDL 40; T 167; LDL 98 (04/10/16)  CathLab: LCx Mid Cx 90% stenosis and 2nd Ob Marg 90% stenosis       Proximal RCA Ostial 60% stenosis       Past Medical History:  Diagnosis Date  . Cataract   . Hyperlipidemia     Current Outpatient Prescriptions on File Prior to Visit  Medication Sig Dispense Refill  . aspirin EC 81 MG tablet Take 81 mg by mouth daily.    [name] ezetimibe (ZETIA) 10 MG tablet Take 10 mg by mouth daily.    . isosorbide mononitrate (IMDUR) 30 MG 24 hr tablet Take 30 mg by mouth daily.    [name] lisinopril (PRINIVIL,ZESTRIL) 5 MG tablet Take 1 tablet (5 mg total) by mouth daily. 90 tablet 3  . metoprolol succinate (TOPROL-XL) 25 MG 24 hr tablet Take 25 mg by mouth daily.     No current facility-administered medications on file prior to  visit.     Allergies  Allergen Reactions  . Atorvastatin Other (See Comments)    Myalgias   . Simvastatin Other (See Comments)    Myalgias    Hyperlipidemia:   LDL of 98 remains elevated above goal of <70 for secondary prevention. Patient intolerant to atorvastatin 40mg  and pravastatin 20mg  daily. Currently taking ezetimibe 10 mg daily but to enough to bring LDL down to goal.  Patient remains physically active and continues to work on lifestyle modifications life eating less red meat, no fried food, and more fresh fruits and vegetables.  PCSK9 (Repatha and Praluent) indication, use, storage, administration and common ADRs discussed with patient during visit.   Will submit paperwork for insurance pre-approval for [place].  Also gave [employer] paperwork to patient today to complete at home and bring back to clinic for potential patient assistance from [facility].  If Repatha not approve will try application for Praluent.   Claiborne Stroble Rodriguez-Guzman PharmD, [facility] [place] [address] 04/27/2016 1:26 PM

## 2016-05-02 ENCOUNTER — Other Ambulatory Visit: Payer: Self-pay | Admitting: Pharmacist

## 2016-05-02 MED ORDER — EVOLOCUMAB 140 MG/ML ~~LOC~~ SOAJ: 140.0000 mg | SUBCUTANEOUS | 11 refills | Status: DC

## 2016-05-18 ENCOUNTER — Telehealth: Payer: Self-pay | Admitting: Internal Medicine

## 2016-05-18 MED ORDER — EZETIMIBE 10 MG PO TABS: 10.0000 mg | ORAL_TABLET | Freq: Every day | ORAL | 3 refills | Status: DC

## 2016-05-18 NOTE — Telephone Encounter (Signed)
New Message   *STAT* If patient is at the pharmacy, call can be transferred to refill team.   1. Which medications need to be refilled? (please list name of each medication and dose if known) Ezetimibe 10mg    2. Which pharmacy/location (including street and city if local pharmacy) is medication to be sent to? [facility] [place]   3. Do they need a 30 day or 90 day supply? 90 day

## 2016-05-18 NOTE — Telephone Encounter (Signed)
Returned the phone call to the patient. Per pharm D it is okay to refill the Ezetimibe 10 mg, daily. Informed the patient that this has been completed to the [facility] in [place]. He verbalized his understanding.

## 2016-05-31 ENCOUNTER — Telehealth: Payer: Self-pay | Admitting: Pharmacist

## 2016-05-31 NOTE — Telephone Encounter (Signed)
LMOM; patient to call [employer] to arrange shipment for [place].   Phone # [phone]

## 2016-06-30 ENCOUNTER — Telehealth: Payer: Self-pay | Admitting: Pharmacist

## 2016-06-30 DIAGNOSIS — E782 Mixed hyperlipidemia: Secondary | ICD-10-CM

## 2016-06-30 NOTE — Telephone Encounter (Signed)
Patient received Repatha 140mg  from [facility] patient assistance program.  Will use 1st injection today. Due to repeat Lipid panel in 3 months if tolerating the injection.

## 2016-07-07 ENCOUNTER — Ambulatory Visit: Payer: Medicare Other | Admitting: Cardiovascular Disease

## 2016-08-15 DIAGNOSIS — H02831 Dermatochalasis of right upper eyelid: Secondary | ICD-10-CM | POA: Diagnosis not present

## 2016-08-15 DIAGNOSIS — H26493 Other secondary cataract, bilateral: Secondary | ICD-10-CM | POA: Diagnosis not present

## 2016-08-28 ENCOUNTER — Telehealth: Payer: Self-pay | Admitting: Internal Medicine

## 2016-08-28 MED ORDER — METOPROLOL SUCCINATE ER 25 MG PO TB24: 25.0000 mg | ORAL_TABLET | Freq: Every day | ORAL | 1 refills | Status: DC

## 2016-08-28 MED ORDER — ISOSORBIDE MONONITRATE ER 30 MG PO TB24: 30.0000 mg | ORAL_TABLET | Freq: Every day | ORAL | 1 refills | Status: DC

## 2016-08-28 NOTE — Telephone Encounter (Signed)
Rx has been sent to the pharmacy electronically. ° °

## 2016-08-28 NOTE — Telephone Encounter (Signed)
°*  STAT* If patient is at the pharmacy, call can be transferred to refill team.   1. Which medications need to be refilled? (please list name of each medication and dose if known) metoprolol succinate 25 mg and Isosorbide mononitrate 30 mg  2. Which pharmacy/location (including street and city if local pharmacy) is medication to be sent to? [facility], [place], [place]  3. Do they need a 30 day or 90 day supply? [place]

## 2016-09-11 DIAGNOSIS — E7800 Pure hypercholesterolemia, unspecified: Secondary | ICD-10-CM | POA: Diagnosis not present

## 2016-09-11 DIAGNOSIS — I25118 Atherosclerotic heart disease of native coronary artery with other forms of angina pectoris: Secondary | ICD-10-CM | POA: Diagnosis not present

## 2016-09-11 DIAGNOSIS — E785 Hyperlipidemia, unspecified: Secondary | ICD-10-CM | POA: Diagnosis not present

## 2016-09-11 DIAGNOSIS — Z9181 History of falling: Secondary | ICD-10-CM | POA: Diagnosis not present

## 2016-09-11 DIAGNOSIS — C61 Malignant neoplasm of prostate: Secondary | ICD-10-CM | POA: Diagnosis not present

## 2016-09-11 DIAGNOSIS — Z79899 Other long term (current) drug therapy: Secondary | ICD-10-CM | POA: Diagnosis not present

## 2016-09-11 DIAGNOSIS — Z0000 Encounter for general adult medical examination without abnormal findings: Secondary | ICD-10-CM | POA: Diagnosis not present

## 2016-10-03 ENCOUNTER — Encounter: Payer: Self-pay | Admitting: Internal Medicine

## 2016-10-03 ENCOUNTER — Ambulatory Visit (INDEPENDENT_AMBULATORY_CARE_PROVIDER_SITE_OTHER): Payer: Medicare Other | Admitting: Internal Medicine

## 2016-10-03 VITALS — BP 128/67 | HR 62 | Ht 69.5 in | Wt 190.6 lb

## 2016-10-03 DIAGNOSIS — Z789 Other specified health status: Secondary | ICD-10-CM | POA: Diagnosis not present

## 2016-10-03 DIAGNOSIS — I712 Thoracic aortic aneurysm, without rupture, unspecified: Secondary | ICD-10-CM

## 2016-10-03 DIAGNOSIS — E785 Hyperlipidemia, unspecified: Secondary | ICD-10-CM | POA: Diagnosis not present

## 2016-10-03 DIAGNOSIS — I2510 Atherosclerotic heart disease of native coronary artery without angina pectoris: Secondary | ICD-10-CM | POA: Diagnosis not present

## 2016-10-03 MED ORDER — NITROGLYCERIN 0.4 MG SL SUBL: 0.4000 mg | SUBLINGUAL_TABLET | SUBLINGUAL | 3 refills | Status: DC | PRN

## 2016-10-03 NOTE — Patient Instructions (Addendum)
Your physician recommends that you return for lab work in November 2018 [place] will need to fast - nothing to eat/drink after midnight  Your physician has recommended you make the following change in your medication - TAKE nitroglycerin as needed  Your physician wants you to follow-up in: 6 months with Dr. Debara Pickett. You will receive a reminder letter in the mail two months in advance. If you don't receive a letter, please call our office to schedule the follow-up appointment.

## 2016-10-03 NOTE — Progress Notes (Signed)
OFFICE NOTE  Chief Complaint:  Follow-up lipid management  Primary Care Physician: System, Pcp Not In  HPI:  Chad Byrd is a 83 y.o. male who was previously followed by Dr. Danie Byrd with [facility] regional physicians. In December 2015 he presented with acute chest pain and underwent cardiac catheterization. This demonstrated severe proximal circumflex stenosis that was high risk for intervention at a bifurcation. At the time he was on no medication and was placed on a statin, long-acting nitrate and metoprolol. Since then he has been chest pain-free. Unfortunately he had significant side effects including severe muscle weakness and fatigue from pravastatin and discontinued this medication. In the past he's been able to take red [place] rice without any problems. Recently he has had problems with some hematuria and saw Dr. Alinda Money with urology who ordered a CT of the abdomen and pelvis. This demonstrated aneurysmal dilatation of the descending thoracic aorta measuring 4.9 cm in diameter. The entire thoracic or aorta was not visualized as this was an abdominal study, and it was recommended that repeat CT angiography or MRA would be performed in 6-12 months. 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 He is not get any more short of breath or have any chest pain symptoms with exertion. He reports good control over his blood pressure. He's not had lipid testing in some time.  03/07/2016  Chad Byrd returns today for follow-up. He had a repeat CT scan demonstrating interval decrease in the size of his ascending thoracic aortic aneurysm from 4.9 cm to 4.4 cm. I suspect this is due to mis-measurement. Blood pressure was noted to be somewhat elevated today 142/70. Given his aneurysm, strict blood pressure control is important. He is on metoprolol succinate 25 mg daily. He has a history of statin intolerance failing 8 atorvastatin and simvastatin in the  past. I started him on ezetimibe and he had a predictable decrease in LDL cholesterol to 111 based on recent testing. This is however very far from goal LDL of less than 70 or lower.  04/06/2016  Chad Byrd was seen today in follow-up. Unfortunately did not qualify for the [place] 10 trial but he is cholesterol remains elevated. He was placed on ezetimibe and he was noted to be recently much higher than goal. I would like to recheck a lipid profile to see where he is at, but I suspect that he would be a good candidate for PCI skin 9 inhibitor. Blood pressure today is lower than expected with the initiation of 10 mg of lisinopril. He also is reported a little bit of increased fatigue but no dizziness or presyncope.  10/03/2016  Chad Byrd returns today for follow-up. I'll cholesterol remains well above goal. I recommended referral for PCSK9 inhibitor. He read up on the side effects and feels that is not a good medicine for him. He had significant statin side effects however his primary care provider did convince him to start low-dose rosuvastatin 5 mg every other day. He seems to be tolerating this and has been on it since August. Symptoms may show up fairly soon. He will need a repeat lipid profile in about 2-3 months. He denies any chest pain or worsening shortness of breath. We assessed his thoracic aortic aneurysm this year and will be due for repeat assessment next year. Blood pressure is at goal.  PMHx:  Past Medical History:  Diagnosis Date  . Cataract   . Hyperlipidemia  Past Surgical History:  Procedure Laterality Date  . HERNIA REPAIR  1982  . RECTAL SURGERY  mid 1990s    FAMHx:  Family History  Problem Relation Age of Onset  . Alzheimer's disease Brother   . Lung cancer Sister    Parents died of "old age"  SOCHx:   reports that he has never smoked. He has never used smokeless tobacco. He reports that he drinks alcohol. He reports that he does not use drugs.  ALLERGIES:   Allergies  Allergen Reactions  . Atorvastatin Other (See Comments)    Myalgias   . Simvastatin Other (See Comments)    Myalgias     ROS: Pertinent items noted in HPI and remainder of comprehensive ROS otherwise negative.  HOME MEDS: Current Outpatient Prescriptions  Medication Sig Dispense Refill  . aspirin EC 81 MG tablet Take 81 mg by mouth daily.    . isosorbide mononitrate (IMDUR) 30 MG 24 hr tablet Take 1 tablet (30 mg total) by mouth daily. 90 tablet 1  . lisinopril (PRINIVIL,ZESTRIL) 5 MG tablet Take 5 mg by mouth daily.    . metoprolol succinate (TOPROL-XL) 25 MG 24 hr tablet Take 1 tablet (25 mg total) by mouth daily. 90 tablet 1  . rosuvastatin (CRESTOR) 5 MG tablet Take 5 mg by mouth daily.    . nitroGLYCERIN (NITROSTAT) 0.4 MG SL tablet Place 1 tablet (0.4 mg total) under the tongue every 5 (five) minutes as needed for chest pain. MAX 3 doses in 15 minutes 25 tablet 3   No current facility-administered medications for this visit.     LABS/IMAGING: No results found for this or any previous visit (from the past 48 hour(s)). No results found.  WEIGHTS: Wt Readings from Last 3 Encounters:  10/03/16 190 lb 9.6 oz (86.5 kg)  04/06/16 200 lb 9.6 oz (91 kg)  03/07/16 204 lb (92.5 kg)    VITALS: BP 128/67 (BP Location: Right Arm)   Pulse 62   Ht 5' 9.5" (1.765 m)   Wt 190 lb 9.6 oz (86.5 kg)   BMI 27.74 kg/m   EXAM: General appearance: alert and no distress Neck: no carotid bruit, no JVD and thyroid not enlarged, symmetric, no tenderness/mass/nodules Lungs: clear to auscultation bilaterally Heart: regular rate and rhythm Abdomen: soft, non-tender; bowel sounds normal; no masses,  no organomegaly Extremities: extremities normal, atraumatic, no cyanosis or edema Pulses: 2+ and symmetric Skin: Skin color, texture, turgor normal. No rashes or lesions Neurologic: Grossly normal Psych: pleasant  EKG: Normal sinus rhythm at 62, LAFB-personally  reviewed  ASSESSMENT: 1. Coronary artery disease with moderate to severe circumflex bifurcation disease (12/2013) managed medically 2. Thoracic aortic aneurysm measuring 4.4 cm (2018) 3. Dyslipidemia 4. Essential hypertension  PLAN: 1.   Mr. Geers has good control over his blood pressure. He'll need repeat imaging of his thoracic aortic aneurysm next year. As far as cholesterol is concerned he's been intolerant to number statins but seem to be tolerating low-dose rosuvastatin every other day. I'll provide a lipid profile assessment for him which she'll get in November. Follow-up with me in 6 months or sooner as necessary.  Pixie Casino, MD, [facility] Attending Cardiologist [place] 10/03/2016, 5:11 PM

## 2016-10-13 DIAGNOSIS — C61 Malignant neoplasm of prostate: Secondary | ICD-10-CM | POA: Diagnosis not present

## 2016-10-20 DIAGNOSIS — C61 Malignant neoplasm of prostate: Secondary | ICD-10-CM | POA: Diagnosis not present

## 2016-10-20 DIAGNOSIS — N312 Flaccid neuropathic bladder, not elsewhere classified: Secondary | ICD-10-CM | POA: Diagnosis not present

## 2016-11-07 DIAGNOSIS — L578 Other skin changes due to chronic exposure to nonionizing radiation: Secondary | ICD-10-CM | POA: Diagnosis not present

## 2016-11-07 DIAGNOSIS — L821 Other seborrheic keratosis: Secondary | ICD-10-CM | POA: Diagnosis not present

## 2016-11-07 DIAGNOSIS — L820 Inflamed seborrheic keratosis: Secondary | ICD-10-CM | POA: Diagnosis not present

## 2016-11-07 DIAGNOSIS — L570 Actinic keratosis: Secondary | ICD-10-CM | POA: Diagnosis not present

## 2017-01-10 ENCOUNTER — Other Ambulatory Visit: Payer: Self-pay

## 2017-01-10 ENCOUNTER — Observation Stay (HOSPITAL_COMMUNITY): Payer: Medicare Other

## 2017-01-10 ENCOUNTER — Encounter (HOSPITAL_COMMUNITY): Payer: Self-pay | Admitting: *Deleted

## 2017-01-10 ENCOUNTER — Emergency Department (HOSPITAL_COMMUNITY): Payer: Medicare Other

## 2017-01-10 ENCOUNTER — Observation Stay (HOSPITAL_COMMUNITY)
Admission: EM | Admit: 2017-01-10 | Discharge: 2017-01-11 | Disposition: A | Discharge status: Home / Self Care | Payer: Medicare Other | Attending: Internal Medicine | Admitting: Internal Medicine

## 2017-01-10 DIAGNOSIS — I517 Cardiomegaly: Secondary | ICD-10-CM | POA: Diagnosis not present

## 2017-01-10 DIAGNOSIS — R531 Weakness: Secondary | ICD-10-CM | POA: Diagnosis not present

## 2017-01-10 DIAGNOSIS — G459 Transient cerebral ischemic attack, unspecified: Secondary | ICD-10-CM | POA: Diagnosis not present

## 2017-01-10 DIAGNOSIS — R9431 Abnormal electrocardiogram [ECG] [EKG]: Secondary | ICD-10-CM | POA: Diagnosis not present

## 2017-01-10 DIAGNOSIS — I2510 Atherosclerotic heart disease of native coronary artery without angina pectoris: Secondary | ICD-10-CM | POA: Insufficient documentation

## 2017-01-10 DIAGNOSIS — I10 Essential (primary) hypertension: Secondary | ICD-10-CM | POA: Diagnosis not present

## 2017-01-10 DIAGNOSIS — I6523 Occlusion and stenosis of bilateral carotid arteries: Secondary | ICD-10-CM | POA: Diagnosis not present

## 2017-01-10 DIAGNOSIS — Z7982 Long term (current) use of aspirin: Secondary | ICD-10-CM | POA: Diagnosis not present

## 2017-01-10 DIAGNOSIS — R200 Anesthesia of skin: Secondary | ICD-10-CM | POA: Diagnosis not present

## 2017-01-10 DIAGNOSIS — E785 Hyperlipidemia, unspecified: Secondary | ICD-10-CM | POA: Diagnosis not present

## 2017-01-10 DIAGNOSIS — R202 Paresthesia of skin: Secondary | ICD-10-CM | POA: Diagnosis not present

## 2017-01-10 DIAGNOSIS — Z6828 Body mass index (BMI) 28.0-28.9, adult: Secondary | ICD-10-CM | POA: Diagnosis not present

## 2017-01-10 DIAGNOSIS — Z789 Other specified health status: Secondary | ICD-10-CM

## 2017-01-10 HISTORY — DX: Anesthesia of skin: R20.0

## 2017-01-10 HISTORY — DX: Essential (primary) hypertension: I10

## 2017-01-10 HISTORY — DX: Transient cerebral ischemic attack, unspecified: G45.9

## 2017-01-10 LAB — I-STAT CHEM 8, ED
BUN: 20 mg/dL (ref 6–20)
Calcium, Ion: 1.16 mmol/L (ref 1.15–1.40)
Chloride: 102 mmol/L (ref 101–111)
Creatinine, Ser: 1 mg/dL (ref 0.61–1.24)
Glucose, Bld: 152 mg/dL — ABNORMAL HIGH (ref 65–99)
HCT: 46 % (ref 39.0–52.0)
Hemoglobin: 15.6 g/dL (ref 13.0–17.0)
Potassium: 3.9 mmol/L (ref 3.5–5.1)
Sodium: 141 mmol/L (ref 135–145)
TCO2: 24 mmol/L (ref 22–32)

## 2017-01-10 LAB — COMPREHENSIVE METABOLIC PANEL
ALBUMIN: 3.7 g/dL (ref 3.5–5.0)
ALK PHOS: 55 U/L (ref 38–126)
ALT: 21 U/L (ref 17–63)
ANION GAP: 7 (ref 5–15)
AST: 28 U/L (ref 15–41)
BILIRUBIN TOTAL: 0.4 mg/dL (ref 0.3–1.2)
BUN: 17 mg/dL (ref 6–20)
CALCIUM: 9 mg/dL (ref 8.9–10.3)
CO2: 25 mmol/L (ref 22–32)
Chloride: 106 mmol/L (ref 101–111)
Creatinine, Ser: 1.08 mg/dL (ref 0.61–1.24)
GFR calc Af Amer: >60 mL/min (ref >60)
GLUCOSE: 149 mg/dL — AB (ref 65–99)
POTASSIUM: 3.9 mmol/L (ref 3.5–5.1)
Sodium: 138 mmol/L (ref 135–145)
TOTAL PROTEIN: 6.9 g/dL (ref 6.5–8.1)

## 2017-01-10 LAB — CBC
HEMATOCRIT: 45.1 % (ref 39.0–52.0)
HEMOGLOBIN: 14.8 g/dL (ref 13.0–17.0)
MCH: 30.4 pg (ref 26.0–34.0)
MCHC: 32.8 g/dL (ref 30.0–36.0)
MCV: 92.6 fL (ref 78.0–100.0)
Platelets: 161 10*3/uL (ref 150–400)
RBC: 4.87 MIL/uL (ref 4.22–5.81)
RDW: 14 % (ref 11.5–15.5)
WBC: 4.9 10*3/uL (ref 4.0–10.5)

## 2017-01-10 LAB — DIFFERENTIAL
Basophils Absolute: 0 10*3/uL (ref 0.0–0.1)
Basophils Relative: 1 %
Eosinophils Absolute: 0.1 10*3/uL (ref 0.0–0.7)
Eosinophils Relative: 2 %
Lymphocytes Relative: 32 %
Lymphs Abs: 1.6 10*3/uL (ref 0.7–4.0)
Monocytes Absolute: 0.4 10*3/uL (ref 0.1–1.0)
Monocytes Relative: 8 %
Neutro Abs: 2.8 10*3/uL (ref 1.7–7.7)
Neutrophils Relative %: 57 %

## 2017-01-10 LAB — CBG MONITORING, ED: GLUCOSE-CAPILLARY: 150 mg/dL — AB (ref 65–99)

## 2017-01-10 LAB — PROTIME-INR
INR: 1.11
Prothrombin Time: 14.2 seconds (ref 11.4–15.2)

## 2017-01-10 LAB — I-STAT TROPONIN, ED: TROPONIN I, POC: 0 ng/mL (ref 0.00–0.08)

## 2017-01-10 LAB — APTT: aPTT: 31 seconds (ref 24–36)

## 2017-01-10 MED ORDER — ENOXAPARIN SODIUM 40 MG/0.4ML ~~LOC~~ SOLN
40.0000 mg | SUBCUTANEOUS | Status: DC
  Administered 2017-01-11: 40 mg via SUBCUTANEOUS
  Filled 2017-01-10: qty 0.4

## 2017-01-10 MED ORDER — ACETAMINOPHEN 650 MG RE SUPP: 650.0000 mg | RECTAL | Status: DC | PRN

## 2017-01-10 MED ORDER — ADULT MULTIVITAMIN W/MINERALS CH
1.0000 | ORAL_TABLET | Freq: Every day | ORAL | Status: DC
  Administered 2017-01-11: 1 via ORAL
  Filled 2017-01-10: qty 1

## 2017-01-10 MED ORDER — IOPAMIDOL (ISOVUE-370) INJECTION 76%
INTRAVENOUS | Status: AC
  Administered 2017-01-10: 50 mL
  Filled 2017-01-10: qty 50

## 2017-01-10 MED ORDER — ACETAMINOPHEN 160 MG/5ML PO SOLN: 650.0000 mg | ORAL | Status: DC | PRN

## 2017-01-10 MED ORDER — SENNOSIDES-DOCUSATE SODIUM 8.6-50 MG PO TABS: 1.0000 | ORAL_TABLET | Freq: Every evening | ORAL | Status: DC | PRN

## 2017-01-10 MED ORDER — ACETAMINOPHEN 325 MG PO TABS: 650.0000 mg | ORAL_TABLET | ORAL | Status: DC | PRN

## 2017-01-10 MED ORDER — ASPIRIN 325 MG PO TABS
325.0000 mg | ORAL_TABLET | Freq: Every day | ORAL | Status: DC
  Administered 2017-01-11: 325 mg via ORAL
  Filled 2017-01-10: qty 1

## 2017-01-10 MED ORDER — ASPIRIN EC 81 MG PO TBEC: 81.0000 mg | DELAYED_RELEASE_TABLET | Freq: Every day | ORAL | Status: DC

## 2017-01-10 MED ORDER — STROKE: EARLY STAGES OF RECOVERY BOOK
Freq: Once | Status: AC
  Administered 2017-01-11: 03:00:00
  Filled 2017-01-10: qty 1

## 2017-01-10 MED ORDER — ROSUVASTATIN CALCIUM 5 MG PO TABS: 5.0000 mg | ORAL_TABLET | ORAL | Status: DC

## 2017-01-10 MED ORDER — ISOSORBIDE MONONITRATE ER 30 MG PO TB24
30.0000 mg | ORAL_TABLET | Freq: Every day | ORAL | Status: DC
  Administered 2017-01-11: 30 mg via ORAL
  Filled 2017-01-10: qty 1

## 2017-01-10 NOTE — H&P (Signed)
History and Physical    Chad Byrd XBD:[id number] DOB: 11/15/1928 DOA: 01/10/2017  PCP: System, Pcp Not In  Patient coming from: Home  I have personally briefly reviewed patient's old medical records in [place]  Chief Complaint: L sided weakness and numbness  HPI: Chad Byrd is a 83 y.o. male with medical history significant of CAD, HTN, HLD, ascending aortic aneurysm.  Presents to the ED with c/o L sided weakness and numbness.  Onset this AM, lasted about 1 hour.  Similar episode x2 weeks ago that was also transient in nature.   ED Course: Asymptomatic in ED.   Review of Systems: As per HPI otherwise 10 point review of systems negative.   Past Medical History:  Diagnosis Date  . Cataract   . Hyperlipidemia     Past Surgical History:  Procedure Laterality Date  . HERNIA REPAIR  1982  . RECTAL SURGERY  mid 1990s     reports that  has never smoked. he has never used smokeless tobacco. He reports that he drinks alcohol. He reports that he does not use drugs.  Allergies  Allergen Reactions  . Atorvastatin Other (See Comments)    Myalgia and causes extreme drowsiness  . Simvastatin Other (See Comments)    Myalgia and extreme drowsiness   . Statins Other (See Comments)    Statins cause muscle aches and extreme drowsiness    Family History  Problem Relation Age of Onset  . Alzheimer's disease Brother   . Lung cancer Sister      Prior to Admission medications   Medication Sig Start Date End Date Taking? Authorizing Provider  aspirin EC 81 MG tablet Take 81 mg by mouth daily.   Yes [provider]  isosorbide mononitrate (IMDUR) 30 MG 24 hr tablet Take 1 tablet (30 mg total) by mouth daily. 08/28/16 08/28/17 Yes Hilty, Nadean Corwin, MD  lisinopril (PRINIVIL,ZESTRIL) 5 MG tablet Take 5 mg by mouth daily.   Yes [provider]  metoprolol succinate (TOPROL-XL) 25 MG 24 hr tablet Take 1 tablet (25 mg total) by mouth daily. 08/28/16  08/28/17 Yes Hilty, Nadean Corwin, MD  Multiple Vitamins-Minerals (ONE-A-DAY MENS 50+ ADVANTAGE) TABS Take 1 tablet by mouth daily.    Yes [provider]  nitroGLYCERIN (NITROSTAT) 0.4 MG SL tablet Place 1 tablet (0.4 mg total) under the tongue every 5 (five) minutes as needed for chest pain. MAX 3 doses in 15 minutes 10/03/16 01/10/17 Yes Hilty, Nadean Corwin, MD  rosuvastatin (CRESTOR) 5 MG tablet Take 5 mg by mouth every other day.    Yes [provider]    Physical Exam: Vitals:   01/10/17 1930 01/10/17 2000 01/10/17 2015 01/10/17 2030  BP: 135/60 114/64 126/66 117/69  Pulse: 65 65 70 71  Resp: 12 11 14 10   Temp:      TempSrc:      SpO2: 100% 97% 98% 97%    Constitutional: NAD, calm, comfortable Eyes: PERRL, lids and conjunctivae normal ENMT: Mucous membranes are moist. Posterior pharynx clear of any exudate or lesions.Normal dentition.  Neck: normal, supple, no masses, no thyromegaly Respiratory: clear to auscultation bilaterally, no wheezing, no crackles. Normal respiratory effort. No accessory muscle use.  Cardiovascular: Regular rate and rhythm, no murmurs / rubs / gallops. No extremity edema. 2+ pedal pulses. No carotid bruits.  Abdomen: no tenderness, no masses palpated. No hepatosplenomegaly. Bowel sounds positive.  Musculoskeletal: no clubbing / cyanosis. No joint deformity upper and lower extremities. Good ROM, no contractures.  Normal muscle tone.  Skin: no rashes, lesions, ulcers. No induration Neurologic: CN 2-12 grossly intact. Sensation intact, DTR normal. Strength 5/5 in all 4.  Psychiatric: Normal judgment and insight. Alert and oriented x 3. Normal mood.    Labs on Admission: I have personally reviewed following labs and imaging studies  CBC: Recent Labs  Lab 01/10/17 1322 01/10/17 1336  WBC 4.9  --   NEUTROABS 2.8  --   HGB 14.8 15.6  HCT 45.1 46.0  MCV 92.6  --   PLT 161  --    Basic Metabolic Panel: Recent Labs  Lab 01/10/17 1322  01/10/17 1336  NA 138 141  K 3.9 3.9  CL 106 102  CO2 25  --   GLUCOSE 149* 152*  BUN 17 20  CREATININE 1.08 1.00  CALCIUM 9.0  --    GFR: CrCl cannot be calculated (Unknown ideal weight.). Liver Function Tests: Recent Labs  Lab 01/10/17 1322  AST 28  ALT 21  ALKPHOS 55  BILITOT 0.4  PROT 6.9  ALBUMIN 3.7   No results for input(s): LIPASE, AMYLASE in the last 168 hours. No results for input(s): AMMONIA in the last 168 hours. Coagulation Profile: Recent Labs  Lab 01/10/17 1322  INR 1.11   Cardiac Enzymes: No results for input(s): CKTOTAL, CKMB, CKMBINDEX, TROPONINI in the last 168 hours. BNP (last 3 results) No results for input(s): PROBNP in the last 8760 hours. HbA1C: No results for input(s): HGBA1C in the last 72 hours. CBG: Recent Labs  Lab 01/10/17 1312  GLUCAP 150*   Lipid Profile: No results for input(s): CHOL, HDL, LDLCALC, TRIG, CHOLHDL, LDLDIRECT in the last 72 hours. Thyroid Function Tests: No results for input(s): TSH, T4TOTAL, FREET4, T3FREE, THYROIDAB in the last 72 hours. Anemia Panel: No results for input(s): VITAMINB12, FOLATE, FERRITIN, TIBC, IRON, RETICCTPCT in the last 72 hours. Urine analysis: No results found for: COLORURINE, APPEARANCEUR, [place], [place], GLUCOSEU, [place], [place], [place], PROTEINUR, UROBILINOGEN, NITRITE, LEUKOCYTESUR  Radiological Exams on Admission: Ct Angio Head W Or Wo Contrast  Result Date: 01/10/2017 CLINICAL DATA:  Left-sided weakness and numbness for 1 hour. Facial numbness. EXAM: CT ANGIOGRAPHY HEAD AND NECK TECHNIQUE: Multidetector CT imaging of the head and neck was performed using the standard protocol during bolus administration of intravenous contrast. Multiplanar CT image reconstructions and MIPs were obtained to evaluate the vascular anatomy. Carotid stenosis measurements (when applicable) are obtained utilizing NASCET criteria, using the distal internal carotid diameter as the denominator.  CONTRAST:  50 mL Isovue 370 COMPARISON:  Head CT 01/10/2017 FINDINGS: CTA NECK FINDINGS Aortic arch: There is mild calcific atherosclerosis of the aortic arch. There is no aneurysm, dissection or hemodynamically significant stenosis of the visualized ascending aorta and aortic arch. Conventional 3 vessel aortic branching pattern. The visualized proximal subclavian arteries are normal. Right carotid system: The right common carotid origin is widely patent. There is no common carotid or internal carotid artery dissection or aneurysm. Atherosclerotic calcification at the carotid bifurcation without hemodynamically significant stenosis. Left carotid system: The left common carotid origin is widely patent. There is no common carotid or internal carotid artery dissection or aneurysm. Mixed density atherosclerotic plaque at the carotid bifurcation without hemodynamically significant stenosis. Vertebral arteries: The vertebral system is codominant. Both vertebral artery origins are normal. Both vertebral arteries are normal to their confluence with the basilar artery. Skeleton: There is no bony spinal canal stenosis. No lytic or blastic lesions. Other neck: The nasopharynx is clear. The oropharynx and hypopharynx are  normal. The epiglottis is normal. The supraglottic larynx, glottis and subglottic larynx are normal. No retropharyngeal collection. The parapharyngeal spaces are preserved. The parotid and submandibular glands are normal. No sialolithiasis or salivary ductal dilatation. The thyroid gland is normal. There is no cervical lymphadenopathy. Upper chest: No pneumothorax or pleural effusion. No nodules or masses. Review of the MIP images confirms the above findings CTA HEAD FINDINGS Anterior circulation: --Intracranial internal carotid arteries: Mild atherosclerotic calcification within the supraclinoid internal carotid arteries without significant stenosis. --Anterior cerebral arteries: Normal. --Middle cerebral  arteries: Moderate narrowing of the distal right M1 segment. Normal left MCA. Distally, there is attenuated vascularity within the right MCA distribution relative to the left. No distal occlusion is identified. --Posterior communicating arteries: Present on the right, absent on the left. Posterior circulation: --Posterior cerebral arteries: Fetal origin of the right PCA. Conventional left PCA. --Superior cerebellar arteries: Normal. --Basilar artery: Normal. --Anterior inferior cerebellar arteries: Normal. --Posterior inferior cerebellar arteries: Normal. Venous sinuses: As permitted by contrast timing, patent. Anatomic variants: Fetal origin of the right posterior cerebral artery. Delayed phase: No parenchymal contrast enhancement. Review of the MIP images confirms the above findings IMPRESSION: 1. No emergent large vessel occlusion or high-grade stenosis. 2. Attenuated vascularity within the distal right MCA distribution. No proximal or distal occlusion. Moderate narrowing of the distal M1 segment of the right MCA. 3. Bilateral carotid bifurcation atherosclerosis without hemodynamically significant stenosis. 4.  Aortic Atherosclerosis (ICD10-I70.0). Electronically Signed   By: Ulyses Jarred M.D.   On: 01/10/2017 22:17   Ct Head Wo Contrast  Result Date: 01/10/2017 CLINICAL DATA:  Left upper extremity numbness and tingling EXAM: CT HEAD WITHOUT CONTRAST TECHNIQUE: Contiguous axial images were obtained from the base of the skull through the vertex without intravenous contrast. COMPARISON:  None. FINDINGS: Brain: There is mild diffuse atrophy. There is no intracranial mass, hemorrhage, extra-axial fluid collection, or midline shift. There is slight small vessel disease in the centra semiovale bilaterally. Elsewhere gray-white compartments appear normal. No evident acute infarct. Vascular: No hyperdense vessel. There is calcification in each carotid siphon. Skull: Bony calvarium appears intact. Sinuses/Orbits:  There is mucosal thickening in several ethmoid air cells bilaterally. There is opacification throughout much of the right sphenoid sinus. Other visualized paranasal sinuses are clear. Orbits appear symmetric bilaterally. Other: Visualized mastoid air cells are clear. IMPRESSION: Atrophy with mild periventricular small vessel disease. No acute infarct evident. No mass or hemorrhage. There are foci of arterial vascular calcification. There are foci of paranasal sinus disease. Electronically Signed   By: Lowella Grip III M.D.   On: 01/10/2017 13:50   Ct Angio Neck W And/or Wo Contrast  Result Date: 01/10/2017 CLINICAL DATA:  Left-sided weakness and numbness for 1 hour. Facial numbness. EXAM: CT ANGIOGRAPHY HEAD AND NECK TECHNIQUE: Multidetector CT imaging of the head and neck was performed using the standard protocol during bolus administration of intravenous contrast. Multiplanar CT image reconstructions and MIPs were obtained to evaluate the vascular anatomy. Carotid stenosis measurements (when applicable) are obtained utilizing NASCET criteria, using the distal internal carotid diameter as the denominator. CONTRAST:  50 mL Isovue 370 COMPARISON:  Head CT 01/10/2017 FINDINGS: CTA NECK FINDINGS Aortic arch: There is mild calcific atherosclerosis of the aortic arch. There is no aneurysm, dissection or hemodynamically significant stenosis of the visualized ascending aorta and aortic arch. Conventional 3 vessel aortic branching pattern. The visualized proximal subclavian arteries are normal. Right carotid system: The right common carotid origin is widely patent. There is no  common carotid or internal carotid artery dissection or aneurysm. Atherosclerotic calcification at the carotid bifurcation without hemodynamically significant stenosis. Left carotid system: The left common carotid origin is widely patent. There is no common carotid or internal carotid artery dissection or aneurysm. Mixed density  atherosclerotic plaque at the carotid bifurcation without hemodynamically significant stenosis. Vertebral arteries: The vertebral system is codominant. Both vertebral artery origins are normal. Both vertebral arteries are normal to their confluence with the basilar artery. Skeleton: There is no bony spinal canal stenosis. No lytic or blastic lesions. Other neck: The nasopharynx is clear. The oropharynx and hypopharynx are normal. The epiglottis is normal. The supraglottic larynx, glottis and subglottic larynx are normal. No retropharyngeal collection. The parapharyngeal spaces are preserved. The parotid and submandibular glands are normal. No sialolithiasis or salivary ductal dilatation. The thyroid gland is normal. There is no cervical lymphadenopathy. Upper chest: No pneumothorax or pleural effusion. No nodules or masses. Review of the MIP images confirms the above findings CTA HEAD FINDINGS Anterior circulation: --Intracranial internal carotid arteries: Mild atherosclerotic calcification within the supraclinoid internal carotid arteries without significant stenosis. --Anterior cerebral arteries: Normal. --Middle cerebral arteries: Moderate narrowing of the distal right M1 segment. Normal left MCA. Distally, there is attenuated vascularity within the right MCA distribution relative to the left. No distal occlusion is identified. --Posterior communicating arteries: Present on the right, absent on the left. Posterior circulation: --Posterior cerebral arteries: Fetal origin of the right PCA. Conventional left PCA. --Superior cerebellar arteries: Normal. --Basilar artery: Normal. --Anterior inferior cerebellar arteries: Normal. --Posterior inferior cerebellar arteries: Normal. Venous sinuses: As permitted by contrast timing, patent. Anatomic variants: Fetal origin of the right posterior cerebral artery. Delayed phase: No parenchymal contrast enhancement. Review of the MIP images confirms the above findings IMPRESSION:  1. No emergent large vessel occlusion or high-grade stenosis. 2. Attenuated vascularity within the distal right MCA distribution. No proximal or distal occlusion. Moderate narrowing of the distal M1 segment of the right MCA. 3. Bilateral carotid bifurcation atherosclerosis without hemodynamically significant stenosis. 4.  Aortic Atherosclerosis (ICD10-I70.0). Electronically Signed   By: Ulyses Jarred M.D.   On: 01/10/2017 22:17    EKG: Independently reviewed.  Assessment/Plan Principal Problem:   Left sided numbness Active Problems:   Hyperlipidemia   Statin intolerance   TIA (transient ischemic attack)   HTN (hypertension)    1. TIA - 1. Stroke pathway and work up 2. Neuro consult 3. Tele monitor 4. 2d echo 5. Work up highly suspicious for a R MCA distribution TIA with vascular disease noted in the R MCA on CTA. 2. HLD - 1. He is intolerant of statins and has only been able to be maintained on low dose crestor every other day. 2. This has already been a concern to cardiology and Dr. Debara Pickett recommended patient be referred for PCSK9 inhibitor therapy, patient declined 3. Repeat LDL pending 4. Depending on results, may want to revisit PCSK9 inhibitor therapy with patient. 3. HTN - 1. Allowing permissive HTN, in contrast to neuro recommendations however; given 4.4cm Ascending aortic aneurysm and that he is asymptomatic with lower BPs right now, I would probably treat at a lower level, and not let him get up to 831 systolic 1. Holding lisinopril and metoprolol 2. Will continue the IMDUR for now though.  DVT prophylaxis: Lovenox Code Status: Full Family Communication: No family in room Disposition Plan: Home after admit Consults called: Neurology Admission status: Place in obs   [place], [place] [facility] Pager [phone]  If 7AM-7PM, please contact day team taking care of patient [email or web address] Password [facility]  01/10/2017, 10:22 PM

## 2017-01-10 NOTE — ED Notes (Signed)
ED Provider at bedside. 

## 2017-01-10 NOTE — ED Notes (Signed)
Patient back from  X-ray 

## 2017-01-10 NOTE — ED Notes (Signed)
Patient transported to CT 

## 2017-01-10 NOTE — Consult Note (Signed)
Neurology Consultation  Reason for Consult: TIA Referring Physician: Dr. Leonette Monarch  CC: Left-sided numbness and weakness-resolved  History is obtained from: Patient, common-law partner  HPI: Chad Byrd is a 83 y.o. male who has a past medical history of coronary artery disease, hypertension and hyperlipidemia, who was in his usual state of health until this morning when he had an episode of numbness and weakness on the left side of his body that lasted for about an hour.  He also noted numbness of his face and also felt some facial twitching on the left side.  He has had a similar episode 2 weeks ago, again that was very transient in nature. Currently, he does not report of any symptoms.  When he arrived in the ER, he did have some residual facial numbness that has since resolved. He denies any headaches at this time.  He denies any similar episodes in the past.  He denies any chest pain shortness of breath nausea vomiting. Currently on aspirin, Imdur and rosuvastatin.  Also on lisinopril and metoprolol for his blood pressure.  LKW: 0400 h 01/10/2017 tpa given?: no, outside the window Premorbid modified Rankin scale (mRS):  0   ROS: A 14 point ROS was performed and is negative except as noted in the HPI.    Past Medical History:  Diagnosis Date  . Cataract   . Hyperlipidemia   Hypertension Hyperlipidemia Coronary artery disease   Family History  Problem Relation Age of Onset  . Alzheimer's disease Brother   . Lung cancer Sister     Social History:   reports that  has never smoked. he has never used smokeless tobacco. He reports that he drinks alcohol. He reports that he does not use drugs. Lives independently, with his common-law partner.  Operates a Teacher, [employer].  Medications No current facility-administered medications for this encounter.   Current Outpatient Medications:  .  aspirin EC 81 MG tablet, Take 81 mg by mouth daily., Disp: , Rfl:  .  isosorbide  mononitrate (IMDUR) 30 MG 24 hr tablet, Take 1 tablet (30 mg total) by mouth daily., Disp: 90 tablet, Rfl: 1 .  lisinopril (PRINIVIL,ZESTRIL) 5 MG tablet, Take 5 mg by mouth daily., Disp: , Rfl:  .  metoprolol succinate (TOPROL-XL) 25 MG 24 hr tablet, Take 1 tablet (25 mg total) by mouth daily., Disp: 90 tablet, Rfl: 1 .  Multiple Vitamins-Minerals (ONE-A-DAY MENS 50+ ADVANTAGE) TABS, Take 1 tablet by mouth daily. , Disp: , Rfl:  .  nitroGLYCERIN (NITROSTAT) 0.4 MG SL tablet, Place 1 tablet (0.4 mg total) under the tongue every 5 (five) minutes as needed for chest pain. MAX 3 doses in 15 minutes, Disp: 25 tablet, Rfl: 3 .  rosuvastatin (CRESTOR) 5 MG tablet, Take 5 mg by mouth every other day. , Disp: , Rfl:   Exam: Current vital signs: BP 117/69   Pulse 71   Temp 97.9 F (36.6 C) (Oral)   Resp 10   SpO2 97%  Vital signs in last 24 hours: Temp:  [97.9 F (36.6 C)] 97.9 F (36.6 C) (12/12 1335) Pulse Rate:  [64-71] 71 (12/12 2030) Resp:  [10-18] 10 (12/12 2030) BP: (114-158)/(60-84) 117/69 (12/12 2030) SpO2:  [94 %-100 %] 97 % (12/12 2030)  GENERAL: Awake, alert in NAD HEENT: - Normocephalic and atraumatic, dry mm, no LN++, no Thyromegally LUNGS - Clear to auscultation bilaterally with no wheezes CV - S1S2 RRR, no m/r/g, equal pulses bilaterally. ABDOMEN - Soft, nontender, nondistended with normoactive BS  Ext: warm, well perfused, intact peripheral pulses, no edema NEURO:  Mental Status: AA&Ox3  Language: speech is clear.  Naming, repetition, fluency, and comprehension intact. Cranial Nerves: PERRL 22mm/brisk. EOMI, visual fields full, no facial asymmetry,   facial sensation intact, hearing intact, tongue/uvula/soft palate midline, normal sternocleidomastoid and trapezius muscle strength. No evidence of tongue atrophy or fibrillations Motor: 5/5 strength bilaterally with normal tone and normal range of motion. Tone: is normal and bulk is normal Sensation- Intact to light touch  bilaterally Coordination: FTN intact bilaterally, no ataxia in BLE. Gait- deferred  NIHSS 0   Labs I have reviewed labs in epic and the results pertinent to this consultation are:  CBC    Component Value Date/Time   WBC 4.9 01/10/2017 1322   RBC 4.87 01/10/2017 1322   HGB 15.6 01/10/2017 1336   HCT 46.0 01/10/2017 1336   PLT 161 01/10/2017 1322   MCV 92.6 01/10/2017 1322   MCH 30.4 01/10/2017 1322   MCHC 32.8 01/10/2017 1322   RDW 14.0 01/10/2017 1322   LYMPHSABS 1.6 01/10/2017 1322   MONOABS 0.4 01/10/2017 1322   EOSABS 0.1 01/10/2017 1322   BASOSABS 0.0 01/10/2017 1322    CMP     Component Value Date/Time   NA 141 01/10/2017 1336   K 3.9 01/10/2017 1336   CL 102 01/10/2017 1336   CO2 25 01/10/2017 1322   GLUCOSE 152 (H) 01/10/2017 1336   BUN 20 01/10/2017 1336   CREATININE 1.00 01/10/2017 1336   CREATININE 1.30 (H) 09/29/2015 1039   CALCIUM 9.0 01/10/2017 1322   PROT 6.9 01/10/2017 1322   ALBUMIN 3.7 01/10/2017 1322   AST 28 01/10/2017 1322   ALT 21 01/10/2017 1322   ALKPHOS 55 01/10/2017 1322   BILITOT 0.4 01/10/2017 1322   GFRNONAA >60 01/10/2017 1322   GFRAA >60 01/10/2017 1322    Lipid Panel     Component Value Date/Time   CHOL 171 04/10/2016 0956   CHOL 203 (H) 09/29/2015 1039   TRIG 167 (H) 04/10/2016 0956   TRIG 127 09/29/2015 1039   HDL 40 04/10/2016 0956   HDL 36 (L) 09/29/2015 1039   CHOLHDL 4.3 04/10/2016 0956   CHOLHDL 5.6 (H) 09/29/2015 1039   LDLCALC 98 04/10/2016 0956   LDLCALC 142 (H) 09/29/2015 1039     Imaging I have reviewed the images obtained:  CT-scan of the brain -noncontrast CT of the head is unremarkable for any acute process.  There are some foci of arterial calcification noted in the official report.  Assessment: Very pleasant 83 year old man with a history of hypertension, coronary artery disease, who presents for evaluation of left-sided numbness and possible weakness that lasted for about 1 hour this morning and  has since completely resolved.  He had similar episodes on the same side about 2 weeks ago. Given his age, history of hypertension and coronary artery disease, these episodes could represent a TIA. I would recommend observation admission for stroke/TIA risk factor workup at this time.  Impression TIA of the right cerebral hemisphere Evaluate for symptomatic right carotid stenosis/occlusion  Recommendations: -Admit to observation -Telemetry monitoring -Allow for permissive hypertension for the first 24-48h - only treat PRN if SBP >220 mmHg. Blood pressures can be gradually normalized to SBP<140 upon discharge. -MRI brain w/o cont -CT Angiogram of Head and neck -Echocardiogram -HgbA1c, fasting lipid panel -Frequent neuro checks -Prophylactic therapy-Antiplatelet med: Aspirin - dose 325mg  PO or 300mg  PR. Can consider switching to Plavix once work up is complete. -  Atorvastatin 80 mg PO daily -Risk factor modification -PT consult, OT consult, Speech consult  Please page stroke NP/PA/MD (listed on AMION)  from 8am-4 pm as this patient will be followed by the stroke team at this point.  -- Amie Portland, MD [facility] [phone] If 7pm to 7am, please call on call as listed on AMION.

## 2017-01-10 NOTE — ED Provider Notes (Signed)
[facility] EMERGENCY DEPARTMENT Provider Note  CSN: [id number] Arrival date & time: 01/10/17 1302  Chief Complaint(s) No chief complaint on file.  HPI Chad Byrd is a 83 y.o. male  with a history of hypertension, hyperlipidemia, coronary artery disease who presents to the emergency department after an episode of left arm and left facial numbness.  Patient reports that he noted the deficits at 4:00 in the morning when he awoke.  Last known normal was 4 hours prior at midnight.  He reports that this is a second time in the past 2 weeks that this is occurred.  He spoke with his primary care provider who recommended coming to the emergency department to rule out TIA.  Currently the patient denies any residual symptoms.  He denies any recent fevers or infections.  No chest pain, shortness of breath, nausea, vomiting, visual disturbance, difficulty speaking or swallowing.  No alleviating or aggravating factors.  No other associated symptoms.   HPI  Past Medical History Past Medical History:  Diagnosis Date  . Cataract   . Hyperlipidemia    Patient Active Problem List   Diagnosis Date Noted  . Statin intolerance 03/07/2016  . CAD in native artery 09/29/2015  . Thoracic aortic aneurysm without rupture ([place]) 09/29/2015  . Hyperlipidemia 09/29/2015   Home Medication(s) Prior to Admission medications   Medication Sig Start Date End Date Taking? Authorizing Provider  aspirin EC 81 MG tablet Take 81 mg by mouth daily.   Yes [provider]  isosorbide mononitrate (IMDUR) 30 MG 24 hr tablet Take 1 tablet (30 mg total) by mouth daily. 08/28/16 08/28/17 Yes Hilty, Nadean Corwin, MD  lisinopril (PRINIVIL,ZESTRIL) 5 MG tablet Take 5 mg by mouth daily.   Yes [provider]  metoprolol succinate (TOPROL-XL) 25 MG 24 hr tablet Take 1 tablet (25 mg total) by mouth daily. 08/28/16 08/28/17 Yes Hilty, Nadean Corwin, MD  Multiple Vitamins-Minerals (ONE-A-DAY MENS 50+  ADVANTAGE) TABS Take 1 tablet by mouth daily.    Yes [provider]  nitroGLYCERIN (NITROSTAT) 0.4 MG SL tablet Place 1 tablet (0.4 mg total) under the tongue every 5 (five) minutes as needed for chest pain. MAX 3 doses in 15 minutes 10/03/16 01/10/17 Yes Hilty, Nadean Corwin, MD  rosuvastatin (CRESTOR) 5 MG tablet Take 5 mg by mouth every other day.    Yes [provider]                                                                                                                                    Past Surgical History Past Surgical History:  Procedure Laterality Date  . HERNIA REPAIR  1982  . RECTAL SURGERY  mid 91s   Family History Family History  Problem Relation Age of Onset  . Alzheimer's disease Brother   . Lung cancer Sister     Social History Social History   Tobacco Use  . Smoking  status: Never Smoker  . Smokeless tobacco: Never Used  Substance Use Topics  . Alcohol use: Yes  . Drug use: No   Allergies Atorvastatin; Simvastatin; and Statins  Review of Systems Review of Systems All other systems are reviewed and are negative for acute change except as noted in the HPI  Physical Exam Vital Signs  I have reviewed the triage vital signs BP 135/60   Pulse 65   Temp 97.9 F (36.6 C) (Oral)   Resp 12   SpO2 100%   Physical Exam  Constitutional: He is oriented to person, place, and time. He appears well-developed and well-nourished. No distress.  HENT:  Head: Normocephalic and atraumatic.  Nose: Nose normal.  Eyes: Conjunctivae and EOM are normal. Pupils are equal, round, and reactive to light. Right eye exhibits no discharge. Left eye exhibits no discharge. No scleral icterus.  Neck: Normal range of motion. Neck supple.  Cardiovascular: Normal rate and regular rhythm. Exam reveals no gallop and no friction rub.  No murmur heard. Pulmonary/Chest: Effort normal and breath sounds normal. No stridor. No respiratory distress. He has no rales.    Abdominal: Soft. He exhibits no distension. There is no tenderness.  Musculoskeletal: He exhibits no edema or tenderness.  Neurological: He is alert and oriented to person, place, and time.  Mental Status:  Alert and oriented to person, place, and time.  Attention and concentration normal.  Speech clear.  Recent memory is intact  Cranial Nerves:  II Visual Fields: Intact to confrontation. Visual fields intact. III, IV, VI: Pupils equal and reactive to light and near. Full eye movement without nystagmus  V Facial Sensation: Normal. No weakness of masticatory muscles  VII: No facial weakness or asymmetry  VIII Auditory Acuity: Grossly normal  IX/X: The uvula is midline; the palate elevates symmetrically  XI: Normal sternocleidomastoid and trapezius strength  XII: The tongue is midline. No atrophy or fasciculations.   Motor System: Muscle Strength: 5/5 and symmetric in the upper and lower extremities. No pronation or drift.  Muscle Tone: Tone and muscle bulk are normal in the upper and lower extremities.   Reflexes: DTRs: 1+ and symmetrical in all four extremities. No Clonus Coordination: Intact finger-to-nose, heel-to-shin. No tremor.  Sensation: Intact to light touch, and pinprick. Negative Romberg test.  Gait: Routine gait normal.   Skin: Skin is warm and dry. No rash noted. He is not diaphoretic. No erythema.  Psychiatric: He has a normal mood and affect.  Vitals reviewed.   ED Results and Treatments Labs (all labs ordered are listed, but only abnormal results are displayed) Labs Reviewed  COMPREHENSIVE METABOLIC PANEL - Abnormal; Notable for the following components:      Result Value   Glucose, Bld 149 (*)    All other components within normal limits  CBG MONITORING, ED - Abnormal; Notable for the following components:   Glucose-Capillary 150 (*)    All other components within normal limits  I-STAT CHEM 8, ED - Abnormal; Notable for the following components:   Glucose,  Bld 152 (*)    All other components within normal limits  PROTIME-INR  APTT  CBC  DIFFERENTIAL  I-STAT TROPONIN, ED  CBG MONITORING, ED  EKG  EKG Interpretation  Date/Time:  Wednesday January 10 2017 13:20:27 EST Ventricular Rate:  63 PR Interval:  168 QRS Duration: 98 QT Interval:  426 QTC Calculation: 435 R Axis:   -140 Text Interpretation:   Suspect arm lead reversal, interpretation assumes no reversal Unusual P axis, possible ectopic atrial rhythm Right superior axis deviation Pulmonary disease pattern Abnormal ECG NO STEMI No old tracing to compare Confirmed by Addison Lank [phone]) on 01/10/2017 7:48:04 PM      Radiology Ct Head Wo Contrast  Result Date: 01/10/2017 CLINICAL DATA:  Left upper extremity numbness and tingling EXAM: CT HEAD WITHOUT CONTRAST TECHNIQUE: Contiguous axial images were obtained from the base of the skull through the vertex without intravenous contrast. COMPARISON:  None. FINDINGS: Brain: There is mild diffuse atrophy. There is no intracranial mass, hemorrhage, extra-axial fluid collection, or midline shift. There is slight small vessel disease in the centra semiovale bilaterally. Elsewhere gray-white compartments appear normal. No evident acute infarct. Vascular: No hyperdense vessel. There is calcification in each carotid siphon. Skull: Bony calvarium appears intact. Sinuses/Orbits: There is mucosal thickening in several ethmoid air cells bilaterally. There is opacification throughout much of the right sphenoid sinus. Other visualized paranasal sinuses are clear. Orbits appear symmetric bilaterally. Other: Visualized mastoid air cells are clear. IMPRESSION: Atrophy with mild periventricular small vessel disease. No acute infarct evident. No mass or hemorrhage. There are foci of arterial vascular calcification. There are foci of paranasal  sinus disease. Electronically Signed   By: Lowella Grip III M.D.   On: 01/10/2017 13:50   Pertinent labs & imaging results that were available during my care of the patient were reviewed by me and considered in my medical decision making (see chart for details).  Medications Ordered in ED Medications - No data to display                                                                                                                                  Procedures Procedures  (including critical care time)  Medical Decision Making / ED Course I have reviewed the nursing notes for this encounter and the patient's prior records (if available in EHR or on provided paperwork).    Exam nonfocal.  CT head with no remote infarcts.  Did note some calcifications.  No mass-effect.  Labs grossly reassuring.  Patient is already on a statin and low-dose aspirin.  ABCD 2 score of 4 (moderate risk).  Will discuss case with neurology.  Discussed case with neurology who recommended admission for TIA workup.  Will admit to medicine.  Final Clinical Impression(s) / ED Diagnoses Final diagnoses:  Left sided numbness      This chart was dictated using voice recognition software.  Despite best efforts to proofread,  errors can occur which can change the documentation meaning.   Fatima Blank, MD 01/10/17 2129

## 2017-01-10 NOTE — ED Triage Notes (Signed)
Pt arrives POV from pcp for further evaluation of possible TIA. Pt reports this morning when he woke up at 0400 he noticed some left sided weakness, decreased sensation to left side of face, pain behind left eye, "bouncy" vision in left eye, and nausea that lasted about 1 hour.On arrival to ED the only deficit on exam is decreased sensation to left side of face. VAN negative.

## 2017-01-10 NOTE — ED Notes (Signed)
Neuro at bedside.

## 2017-01-10 NOTE — ED Notes (Signed)
Patient transported to X-ray 

## 2017-01-10 NOTE — ED Notes (Signed)
Report attempted x 1

## 2017-01-11 ENCOUNTER — Observation Stay (HOSPITAL_COMMUNITY): Payer: Medicare Other

## 2017-01-11 ENCOUNTER — Observation Stay (HOSPITAL_BASED_OUTPATIENT_CLINIC_OR_DEPARTMENT_OTHER): Payer: Medicare Other

## 2017-01-11 DIAGNOSIS — I10 Essential (primary) hypertension: Secondary | ICD-10-CM | POA: Diagnosis not present

## 2017-01-11 DIAGNOSIS — G459 Transient cerebral ischemic attack, unspecified: Secondary | ICD-10-CM | POA: Diagnosis not present

## 2017-01-11 DIAGNOSIS — I5030 Unspecified diastolic (congestive) heart failure: Secondary | ICD-10-CM | POA: Diagnosis not present

## 2017-01-11 DIAGNOSIS — R200 Anesthesia of skin: Secondary | ICD-10-CM | POA: Diagnosis not present

## 2017-01-11 LAB — ECHOCARDIOGRAM COMPLETE
CHL CUP DOP CALC LVOT VTI: 26.3 cm
CHL CUP MV DEC (S): 306
E/e' ratio: 6.87
EWDT: 306 ms
FS: 35 % (ref 28–44)
HEIGHTINCHES: 69 in
IV/PV OW: 1.03
LA ID, A-P, ES: 42 mm
LA diam index: 2.07 cm/m2
LA vol A4C: 45.3 ml
LA vol index: 21 mL/m2
LA vol: 42.6 mL
LDCA: 3.46 cm2
LEFT ATRIUM END SYS DIAM: 42 mm
LV E/e'average: 6.87
LV PW d: 10.8 mm — AB (ref 0.6–1.1)
LV TDI E'LATERAL: 10.3
LV TDI E'MEDIAL: 5.51
LVEEMED: 6.87
LVELAT: 10.3 cm/s
LVOT peak grad rest: 7 mmHg
LVOTD: 21 mm
LVOTPV: 130 cm/s
LVOTSV: 91 mL
Lateral S' vel: 10.7 cm/s
MV Peak grad: 2 mmHg
MV pk A vel: 113 m/s
MV pk E vel: 70.8 m/s
P 1/2 time: 475 ms
TAPSE: 12.4 mm
WEIGHTICAEL: 3065.28 [oz_av]

## 2017-01-11 LAB — LIPID PANEL
CHOL/HDL RATIO: 4 ratio
Cholesterol: 153 mg/dL (ref 0–200)
HDL: 38 mg/dL — ABNORMAL LOW (ref >40)
LDL Cholesterol: 85 mg/dL (ref 0–99)
Triglycerides: 148 mg/dL (ref <150)
VLDL: 30 mg/dL (ref 0–40)

## 2017-01-11 LAB — HEMOGLOBIN A1C
Hgb A1c MFr Bld: 5.7 % — ABNORMAL HIGH (ref 4.8–5.6)
Mean Plasma Glucose: 116.89 mg/dL

## 2017-01-11 MED ORDER — CLOPIDOGREL BISULFATE 75 MG PO TABS: 75.0000 mg | ORAL_TABLET | Freq: Every day | ORAL | 0 refills | Status: DC

## 2017-01-11 MED ORDER — CLOPIDOGREL BISULFATE 75 MG PO TABS: 75.0000 mg | ORAL_TABLET | Freq: Every day | ORAL | Status: DC

## 2017-01-11 MED ORDER — ROSUVASTATIN CALCIUM 5 MG PO TABS: 5.0000 mg | ORAL_TABLET | ORAL | Status: DC

## 2017-01-11 MED ORDER — COENZYME Q10 30 MG PO CAPS: 30.0000 mg | ORAL_CAPSULE | Freq: Every day | ORAL | 0 refills | Status: DC

## 2017-01-11 MED ORDER — ROSUVASTATIN CALCIUM 5 MG PO TABS
10.0000 mg | ORAL_TABLET | ORAL | Status: DC
  Administered 2017-01-11: 10 mg via ORAL
  Filled 2017-01-11: qty 2

## 2017-01-11 NOTE — Discharge Summary (Addendum)
Physician Discharge Summary  [name] XLK:[id number] DOB: Apr 26, 1928 DOA: 01/10/2017  PCP: System, Pcp Not In  Admit date: 01/10/2017 Discharge date: 01/11/2017  Admitted From: home Disposition:  home   Discharge Condition:  stable   CODE STATUS:  Full code   Consultations:  neuro    Discharge Diagnoses:  Principal Problem:   Left sided numbness Active Problems:   Hyperlipidemia   Statin intolerance   TIA (transient ischemic attack)   HTN (hypertension)    Subjective: Numbness in left arm resolved soon after waking up yesterday and has not recurred again. No new neurological symptoms.   Brief Summary: Chad Byrd is a 83 y.o. male with medical history significant of CAD, HTN, HLD, ascending aortic aneurysm.  Presents to the ED with c/o L sided  numbness.  Onset this AM, lasted about 1 hour.  Similar episode x2 weeks ago that was also transient in nature.    Hospital Course:  Left arm numbness- possible TIA - per patient he did have a similar episode x 2 wks ago. He feels that he may have slept on the arm which is why he woke up with it being numb twice so far. He states that he did NOT have any weakness. The numbness resolved in < 30 min.  - has had a head CT which is unrevealing - he has declined an MRI when asked by myself, neurology and the RN and therefore this ordered was discontinued by neurology - ECHO does not show any thrombus - CTA does not shows moderate stenosis ( see report below) not not any severe stenosis  - LDL 85 - A1c: 5.7 - start Plavix per Neuro  HLD - intolerant of higher doses of Statin - neuro recommends adding Coenzyme Q to Crestor that he already takes  HTN - resume home meds  CAD - managed by Dr Debara Pickett as outpt  AAA - 4.4 cm- outpt follow up  Discharge Instructions  Discharge Instructions    Ambulatory referral to Neurology   Complete by:  As directed    An appointment is requested in approximately:6 weeke Follow  up with stroke clinic (Dr Leonie Man preferred, if not available, then consider Caesar Chestnut, [facility] or Jaynee Eagles whoever is available) at [facility] in about 6-8 weeks. Thanks.   Diet - low sodium heart healthy   Complete by:  As directed    Increase activity slowly   Complete by:  As directed      Allergies as of 01/11/2017      Reactions   Atorvastatin Other (See Comments)   Myalgia and causes extreme drowsiness   Simvastatin Other (See Comments)   Myalgia and extreme drowsiness   Statins Other (See Comments)   Statins cause muscle aches and extreme drowsiness      Medication List    STOP taking these medications   aspirin EC 81 MG tablet     TAKE these medications   clopidogrel 75 MG tablet Commonly known as:  PLAVIX Take 1 tablet (75 mg total) by mouth daily.   co-enzyme Q-10 30 MG capsule Take 1 capsule (30 mg total) by mouth daily.   isosorbide mononitrate 30 MG 24 hr tablet Commonly known as:  IMDUR Take 1 tablet (30 mg total) by mouth daily.   lisinopril 5 MG tablet Commonly known as:  PRINIVIL,ZESTRIL Take 5 mg by mouth daily.   metoprolol succinate 25 MG 24 hr tablet Commonly known as:  TOPROL-XL Take 1 tablet (25 mg total) by mouth daily.  nitroGLYCERIN 0.4 MG SL tablet Commonly known as:  NITROSTAT Place 1 tablet (0.4 mg total) under the tongue every 5 (five) minutes as needed for chest pain. MAX 3 doses in 15 minutes   ONE-A-DAY MENS 50+ ADVANTAGE Tabs Take 1 tablet by mouth daily.   rosuvastatin 5 MG tablet Commonly known as:  CRESTOR Take 5 mg by mouth every other day.      Follow-up Information    Garvin Fila, MD. Schedule an appointment as soon as possible for a visit in 6 week(s).   Specialties:  Neurology, Radiology Contact information: [address] [place] [address] [phone]          Allergies  Allergen Reactions  . Atorvastatin Other (See Comments)    Myalgia and causes extreme drowsiness  . Simvastatin Other  (See Comments)    Myalgia and extreme drowsiness   . Statins Other (See Comments)    Statins cause muscle aches and extreme drowsiness     Procedures/Studies: 2 D ECHO Left ventricle: The cavity size was normal. Wall thickness was   normal. Systolic function was normal. The estimated ejection   fraction was in the range of 55% to 60%. Doppler parameters are   consistent with abnormal left ventricular relaxation (grade 1   diastolic dysfunction). - Aortic valve: There was mild regurgitation.  Ct Angio Head W Or Wo Contrast  Result Date: 01/10/2017 CLINICAL DATA:  Left-sided weakness and numbness for 1 hour. Facial numbness. EXAM: CT ANGIOGRAPHY HEAD AND NECK TECHNIQUE: Multidetector CT imaging of the head and neck was performed using the standard protocol during bolus administration of intravenous contrast. Multiplanar CT image reconstructions and MIPs were obtained to evaluate the vascular anatomy. Carotid stenosis measurements (when applicable) are obtained utilizing NASCET criteria, using the distal internal carotid diameter as the denominator. CONTRAST:  50 mL Isovue 370 COMPARISON:  Head CT 01/10/2017 FINDINGS: CTA NECK FINDINGS Aortic arch: There is mild calcific atherosclerosis of the aortic arch. There is no aneurysm, dissection or hemodynamically significant stenosis of the visualized ascending aorta and aortic arch. Conventional 3 vessel aortic branching pattern. The visualized proximal subclavian arteries are normal. Right carotid system: The right common carotid origin is widely patent. There is no common carotid or internal carotid artery dissection or aneurysm. Atherosclerotic calcification at the carotid bifurcation without hemodynamically significant stenosis. Left carotid system: The left common carotid origin is widely patent. There is no common carotid or internal carotid artery dissection or aneurysm. Mixed density atherosclerotic plaque at the carotid bifurcation without  hemodynamically significant stenosis. Vertebral arteries: The vertebral system is codominant. Both vertebral artery origins are normal. Both vertebral arteries are normal to their confluence with the basilar artery. Skeleton: There is no bony spinal canal stenosis. No lytic or blastic lesions. Other neck: The nasopharynx is clear. The oropharynx and hypopharynx are normal. The epiglottis is normal. The supraglottic larynx, glottis and subglottic larynx are normal. No retropharyngeal collection. The parapharyngeal spaces are preserved. The parotid and submandibular glands are normal. No sialolithiasis or salivary ductal dilatation. The thyroid gland is normal. There is no cervical lymphadenopathy. Upper chest: No pneumothorax or pleural effusion. No nodules or masses. Review of the MIP images confirms the above findings CTA HEAD FINDINGS Anterior circulation: --Intracranial internal carotid arteries: Mild atherosclerotic calcification within the supraclinoid internal carotid arteries without significant stenosis. --Anterior cerebral arteries: Normal. --Middle cerebral arteries: Moderate narrowing of the distal right M1 segment. Normal left MCA. Distally, there is attenuated vascularity within the right  MCA distribution relative to the left. No distal occlusion is identified. --Posterior communicating arteries: Present on the right, absent on the left. Posterior circulation: --Posterior cerebral arteries: Fetal origin of the right PCA. Conventional left PCA. --Superior cerebellar arteries: Normal. --Basilar artery: Normal. --Anterior inferior cerebellar arteries: Normal. --Posterior inferior cerebellar arteries: Normal. Venous sinuses: As permitted by contrast timing, patent. Anatomic variants: Fetal origin of the right posterior cerebral artery. Delayed phase: No parenchymal contrast enhancement. Review of the MIP images confirms the above findings IMPRESSION: 1. No emergent large vessel occlusion or high-grade  stenosis. 2. Attenuated vascularity within the distal right MCA distribution. No proximal or distal occlusion. Moderate narrowing of the distal M1 segment of the right MCA. 3. Bilateral carotid bifurcation atherosclerosis without hemodynamically significant stenosis. 4.  Aortic Atherosclerosis (ICD10-I70.0). Electronically Signed   By: Ulyses Jarred M.D.   On: 01/10/2017 22:17   Dg Chest 2 View  Result Date: 01/10/2017 CLINICAL DATA:  Transient ischemic attack. EXAM: CHEST  2 VIEW COMPARISON:  Chest CT 03/06/2016 FINDINGS: Cardiomegaly with aortic atherosclerosis. Clear lungs with minimal atelectasis at the left lung base. Mild degenerative change along the dorsal spine. IMPRESSION: Mild cardiomegaly with aortic atherosclerosis. No active pulmonary disease. Electronically Signed   By: Ashley Royalty M.D.   On: 01/10/2017 22:42   Ct Head Wo Contrast  Result Date: 01/10/2017 CLINICAL DATA:  Left upper extremity numbness and tingling EXAM: CT HEAD WITHOUT CONTRAST TECHNIQUE: Contiguous axial images were obtained from the base of the skull through the vertex without intravenous contrast. COMPARISON:  None. FINDINGS: Brain: There is mild diffuse atrophy. There is no intracranial mass, hemorrhage, extra-axial fluid collection, or midline shift. There is slight small vessel disease in the centra semiovale bilaterally. Elsewhere gray-white compartments appear normal. No evident acute infarct. Vascular: No hyperdense vessel. There is calcification in each carotid siphon. Skull: Bony calvarium appears intact. Sinuses/Orbits: There is mucosal thickening in several ethmoid air cells bilaterally. There is opacification throughout much of the right sphenoid sinus. Other visualized paranasal sinuses are clear. Orbits appear symmetric bilaterally. Other: Visualized mastoid air cells are clear. IMPRESSION: Atrophy with mild periventricular small vessel disease. No acute infarct evident. No mass or hemorrhage. There are foci  of arterial vascular calcification. There are foci of paranasal sinus disease. Electronically Signed   By: Lowella Grip III M.D.   On: 01/10/2017 13:50   Ct Angio Neck W And/or Wo Contrast  Result Date: 01/10/2017 CLINICAL DATA:  Left-sided weakness and numbness for 1 hour. Facial numbness. EXAM: CT ANGIOGRAPHY HEAD AND NECK TECHNIQUE: Multidetector CT imaging of the head and neck was performed using the standard protocol during bolus administration of intravenous contrast. Multiplanar CT image reconstructions and MIPs were obtained to evaluate the vascular anatomy. Carotid stenosis measurements (when applicable) are obtained utilizing NASCET criteria, using the distal internal carotid diameter as the denominator. CONTRAST:  50 mL Isovue 370 COMPARISON:  Head CT 01/10/2017 FINDINGS: CTA NECK FINDINGS Aortic arch: There is mild calcific atherosclerosis of the aortic arch. There is no aneurysm, dissection or hemodynamically significant stenosis of the visualized ascending aorta and aortic arch. Conventional 3 vessel aortic branching pattern. The visualized proximal subclavian arteries are normal. Right carotid system: The right common carotid origin is widely patent. There is no common carotid or internal carotid artery dissection or aneurysm. Atherosclerotic calcification at the carotid bifurcation without hemodynamically significant stenosis. Left carotid system: The left common carotid origin is widely patent. There is no common carotid or internal carotid artery dissection  or aneurysm. Mixed density atherosclerotic plaque at the carotid bifurcation without hemodynamically significant stenosis. Vertebral arteries: The vertebral system is codominant. Both vertebral artery origins are normal. Both vertebral arteries are normal to their confluence with the basilar artery. Skeleton: There is no bony spinal canal stenosis. No lytic or blastic lesions. Other neck: The nasopharynx is clear. The oropharynx and  hypopharynx are normal. The epiglottis is normal. The supraglottic larynx, glottis and subglottic larynx are normal. No retropharyngeal collection. The parapharyngeal spaces are preserved. The parotid and submandibular glands are normal. No sialolithiasis or salivary ductal dilatation. The thyroid gland is normal. There is no cervical lymphadenopathy. Upper chest: No pneumothorax or pleural effusion. No nodules or masses. Review of the MIP images confirms the above findings CTA HEAD FINDINGS Anterior circulation: --Intracranial internal carotid arteries: Mild atherosclerotic calcification within the supraclinoid internal carotid arteries without significant stenosis. --Anterior cerebral arteries: Normal. --Middle cerebral arteries: Moderate narrowing of the distal right M1 segment. Normal left MCA. Distally, there is attenuated vascularity within the right MCA distribution relative to the left. No distal occlusion is identified. --Posterior communicating arteries: Present on the right, absent on the left. Posterior circulation: --Posterior cerebral arteries: Fetal origin of the right PCA. Conventional left PCA. --Superior cerebellar arteries: Normal. --Basilar artery: Normal. --Anterior inferior cerebellar arteries: Normal. --Posterior inferior cerebellar arteries: Normal. Venous sinuses: As permitted by contrast timing, patent. Anatomic variants: Fetal origin of the right posterior cerebral artery. Delayed phase: No parenchymal contrast enhancement. Review of the MIP images confirms the above findings IMPRESSION: 1. No emergent large vessel occlusion or high-grade stenosis. 2. Attenuated vascularity within the distal right MCA distribution. No proximal or distal occlusion. Moderate narrowing of the distal M1 segment of the right MCA. 3. Bilateral carotid bifurcation atherosclerosis without hemodynamically significant stenosis. 4.  Aortic Atherosclerosis (ICD10-I70.0). Electronically Signed   By: Ulyses Jarred M.D.    On: 01/10/2017 22:17       Discharge Exam: Vitals:   01/11/17 0625 01/11/17 0935  BP: (!) 133/51 132/66  Pulse: (!) 49 70  Resp: 15 16  Temp: 98.7 F (37.1 C) 98 F (36.7 C)  SpO2: 99% 98%   Vitals:   01/11/17 0300 01/11/17 0500 01/11/17 0625 01/11/17 0935  BP: (!) 116/55 (!) 109/51 (!) 133/51 132/66  Pulse: 61 (!) 52 (!) 49 70  Resp: 16 16 15 16   Temp: 98.7 F (37.1 C) 98.7 F (37.1 C) 98.7 F (37.1 C) 98 F (36.7 C)  TempSrc: Oral Oral Oral Oral  SpO2: 95% 97% 99% 98%  Weight:      Height:        General: Pt is alert, awake, not in acute distress Cardiovascular: RRR, S1/S2 +, no rubs, no gallops Respiratory: CTA bilaterally, no wheezing, no rhonchi Abdominal: Soft, NT, ND, bowel sounds + Extremities: no edema, no cyanosis    The results of significant diagnostics from this hospitalization (including imaging, microbiology, ancillary and laboratory) are listed below for reference.     Microbiology: No results found for this or any previous visit (from the past 240 hour(s)).   Labs: BNP (last 3 results) No results for input(s): BNP in the last 8760 hours. Basic Metabolic Panel: Recent Labs  Lab 01/10/17 1322 01/10/17 1336  NA 138 141  K 3.9 3.9  CL 106 102  CO2 25  --   GLUCOSE 149* 152*  BUN 17 20  CREATININE 1.08 1.00  CALCIUM 9.0  --    Liver Function Tests: Recent Labs  Lab  01/10/17 1322  AST 28  ALT 21  ALKPHOS 55  BILITOT 0.4  PROT 6.9  ALBUMIN 3.7   No results for input(s): LIPASE, AMYLASE in the last 168 hours. No results for input(s): AMMONIA in the last 168 hours. CBC: Recent Labs  Lab 01/10/17 1322 01/10/17 1336  WBC 4.9  --   NEUTROABS 2.8  --   HGB 14.8 15.6  HCT 45.1 46.0  MCV 92.6  --   PLT 161  --    Cardiac Enzymes: No results for input(s): CKTOTAL, CKMB, CKMBINDEX, TROPONINI in the last 168 hours. BNP: Invalid input(s): POCBNP CBG: Recent Labs  Lab 01/10/17 1312  GLUCAP 150*   D-Dimer No results for  input(s): DDIMER in the last 72 hours. Hgb A1c Recent Labs    01/11/17 0444  HGBA1C 5.7*   Lipid Profile Recent Labs    01/11/17 0444  CHOL 153  HDL 38*  LDLCALC 85  TRIG 148  CHOLHDL 4.0   Thyroid function studies No results for input(s): TSH, T4TOTAL, T3FREE, THYROIDAB in the last 72 hours.  Invalid input(s): FREET3 Anemia work up No results for input(s): VITAMINB12, FOLATE, FERRITIN, TIBC, IRON, RETICCTPCT in the last 72 hours. Urinalysis No results found for: COLORURINE, APPEARANCEUR, LABSPEC, [place], GLUCOSEU, HGBUR, BILIRUBINUR, KETONESUR, PROTEINUR, UROBILINOGEN, NITRITE, LEUKOCYTESUR Sepsis Labs Invalid input(s): PROCALCITONIN,  WBC,  LACTICIDVEN Microbiology No results found for this or any previous visit (from the past 240 hour(s)).   Time coordinating discharge: Over 30 minutes  SIGNED:   Debbe Odea, MD  [facility] 01/11/2017, 3:09 PM Pager   If 7PM-7AM, please contact night-coverage [email or web address] Password [email or web address]

## 2017-01-11 NOTE — Progress Notes (Signed)
Per dr. Rory Percy pt can be removed from tele to go for MRI testing this morning. Cheron Schaumann RN

## 2017-01-11 NOTE — Progress Notes (Signed)
  Echocardiogram 2D Echocardiogram has been performed.  Raelee Rossmann L Androw 01/11/2017, 1:04 PM

## 2017-01-11 NOTE — Plan of Care (Signed)
MOBILIZING WELL, REFUSES MRI STUDY AT THIS POINT.

## 2017-01-11 NOTE — Progress Notes (Signed)
SLP Cancellation Note  Patient Details Name: Chad Byrd MRN: [id number] DOB: 1928/08/22   Cancelled treatment:       Reason Eval/Treat Not Completed: Patient at procedure or test/unavailable. Pt leaving for vascular study at this time. Will continue efforts.  Taron Conrey B. Quentin Ore [facility], CCC-SLP Speech Language Pathologist [phone]  Shonna Chock 01/11/2017, 10:32 AM

## 2017-01-11 NOTE — Evaluation (Signed)
Occupational Therapy Evaluation and Discharge Patient Details Name: Chad Byrd MRN: [id number] DOB: 1928/09/13 Today's Date: 01/11/2017    History of Present Illness Pt is a 83 y.o. male presenting with transient L sided numbness and weakness. CT on 12/12 negative for acute infarct. MRI pending.   Clinical Impression   Pt reports he was independent with ADL and mobility PTA. Currently pt at his functional baseline and is independent with ADL and mobility. Educated pt on signs/symptoms of CVA; pt verbalized understanding. Pt planning to d/c home with supervision from family. No further acute OT needs identified; signing off at this time. Please re-consult if needs change. Thank you for this referral.    Follow Up Recommendations  No OT follow up;Supervision - Intermittent    Equipment Recommendations  None recommended by OT    Recommendations for Other Services       Precautions / Restrictions Precautions Precautions: None Restrictions Weight Bearing Restrictions: No      Mobility Bed Mobility Overal bed mobility: Independent                Transfers Overall transfer level: Independent Equipment used: None                  Balance Overall balance assessment: No apparent balance deficits (not formally assessed)                                         ADL either performed or assessed with clinical judgement   ADL Overall ADL's : Independent;At baseline                                       General ADL Comments: Educated pt on signs/symptoms of stroke and need to return to hospital if experiencing any symptoms.     Vision Baseline Vision/History: Wears glasses Wears Glasses: Reading only Patient Visual Report: No change from baseline Vision Assessment?: No apparent visual deficits     Perception     Praxis      Pertinent Vitals/Pain Pain Assessment: No/denies pain     Hand Dominance Right    Extremity/Trunk Assessment Upper Extremity Assessment Upper Extremity Assessment: Overall WFL for tasks assessed   Lower Extremity Assessment Lower Extremity Assessment: Defer to PT evaluation   Cervical / Trunk Assessment Cervical / Trunk Assessment: Kyphotic   Communication Communication Communication: No difficulties   Cognition Arousal/Alertness: Awake/alert Behavior During Therapy: WFL for tasks assessed/performed Overall Cognitive Status: Within Functional Limits for tasks assessed                                     General Comments       Exercises     Shoulder Instructions      Home Living Family/patient expects to be discharged to:: Private residence Living Arrangements: Spouse/significant other Available Help at Discharge: Family;Available 24 hours/day Type of Home: House Home Access: Stairs to enter [employer] of Steps: 1   Home Layout: Two level;Bed/bath upstairs Alternate Level Stairs-Number of Steps: 5   Bathroom Shower/Tub: Occupational psychologist: Standard     Home Equipment: None          Prior Functioning/Environment Level of Independence: Independent  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        OT Problem List:        OT Treatment/Interventions:      OT Goals(Current goals can be found in the care plan section) Acute Rehab OT Goals Patient Stated Goal: return home OT Goal Formulation: All assessment and education complete, DC therapy  OT Frequency:     Barriers to D/C:            Co-evaluation              AM-PAC PT "6 Clicks" Daily Activity     Outcome Measure Help from another person eating meals?: None Help from another person taking care of personal grooming?: None Help from another person toileting, which includes using toliet, bedpan, or urinal?: None Help from another person bathing (including washing, rinsing, drying)?: None Help from another person to put on and  taking off regular upper body clothing?: None Help from another person to put on and taking off regular lower body clothing?: None 6 Click Score: 24   End of Session Nurse Communication: Mobility status  Activity Tolerance: Patient tolerated treatment well Patient left: in chair;with call bell/phone within reach;with chair alarm set  OT Visit Diagnosis: Hemiplegia and hemiparesis Hemiplegia - Right/Left: Left Hemiplegia - dominant/non-dominant: Non-Dominant                Time: 7867-5449 OT Time Calculation (min): 17 min Charges:  OT General Charges $OT Visit: 1 Visit OT Evaluation $OT Eval Low Complexity: 1 Low G-Codes: OT G-codes **NOT FOR INPATIENT CLASS** Functional Assessment Tool Used: AM-PAC 6 Clicks Daily Activity Functional Limitation: Self care Self Care Current Status (E0100): 0 percent impaired, limited or restricted Self Care Goal Status (F1219): 0 percent impaired, limited or restricted Self Care Discharge Status (X5883): 0 percent impaired, limited or restricted   Mel Almond A. Ulice Brilliant, M.S., OTR/L Pager: [place] 01/11/2017, 9:33 AM

## 2017-01-11 NOTE — Progress Notes (Signed)
PT CONTINUES TO DECLINE MRI OF HEAD AT THIS POINT, [facility] APPRISED, AND PT WISHES TO DC HOME AT THIS POINT. NO FURTHER COMPLAINTS RELATED TO ADMISSION, STATES THAT HE IS AT HIS BASELINE.

## 2017-01-11 NOTE — Care Management Note (Signed)
Case Management Note  Patient Details  Name: Chad Byrd MRN: [id number] Date of Birth: [birth year]  Subjective/Objective:    Pt in with left sided numbness. He is from home with his spouse.                Action/Plan: No f/u per PT/OT and no DME needs. Pt discharging home with self care. Pt does have a PcP in [place]: Dr Lennette Bihari.  Wife providing transportation home.  Expected Discharge Date:  01/11/17               Expected Discharge Plan:  Home/Self Care  In-House Referral:     Discharge planning Services  CM Consult  Post Acute Care Choice:    Choice offered to:     DME Arranged:    DME Agency:     HH Arranged:    HH Agency:     Status of Service:  Completed, signed off  If discussed at [employer] of Stay Meetings, dates discussed:    Additional Comments:  Pollie Friar, RN 01/11/2017, 3:35 PM

## 2017-01-11 NOTE — Progress Notes (Signed)
SLP Cancellation Note  Patient Details Name: Chad Byrd MRN: [id number] DOB: 28-Nov-1928   Cancelled treatment:       Reason Eval/Treat Not Completed: SLP screened, no needs identified, will sign off. Pt was encouraged to notify PCP if difficulties are noted after DC and return to normal routines.  Tatym Schermer B. Quentin Ore [facility], CCC-SLP Speech Language Pathologist [phone]  Shonna Chock 01/11/2017, 12:01 PM

## 2017-01-11 NOTE — Evaluation (Signed)
Physical Therapy Evaluation/Discharge Patient Details Name: Chad Byrd MRN: [id number] DOB: 05/07/1928 Today's Date: 01/11/2017   History of Present Illness  Pt is a 83 y.o. male presenting with transient L sided numbness and weakness. CT on 12/12 negative for acute infarct. MRI pending. PMH significant of HLD, HTN, CAD, and ascending aortic aneurysm.   Clinical Impression  Pt appears to be back at baseline. Pt demonstrates independent ambulation and stair negotiation. Pt presenting with mild balance deficits, demonstrating ability to hold tandem stance for ~10 seconds and SLS for ~5 seconds. Pt utilizes steppage strategy to regain balance safely. [place], trains dogs, and is very active at baseline. Educated pt on higher risk of stroke and signs of stroke. Pt does not need follow up PT services or acute PT services as pt has demonstrated independence with mobility. Pt agrees with and understands PT recommendations. Discharge from PT.     Follow Up Recommendations No PT follow up    Equipment Recommendations  None recommended by PT    Recommendations for Other Services       Precautions / Restrictions Precautions Precautions: None Restrictions Weight Bearing Restrictions: No      Mobility  Bed Mobility Overal bed mobility: Independent             General bed mobility comments: PT OOB in chair upon PT arrival.  Transfers Overall transfer level: Independent Equipment used: None Transfers: Sit to/from Stand Sit to Stand: Independent         General transfer comment: x1 from chair.   Ambulation/Gait Ambulation/Gait assistance: Independent Ambulation Distance (Feet): 200 Feet Assistive device: None Gait Pattern/deviations: WFL(Within Functional Limits) Gait velocity: normal speed      Stairs Stairs: Yes Stairs assistance: Independent Stair Management: No rails;One rail Right;Alternating pattern;Forwards Number of Stairs: 10 General stair  comments: Pt uses railing on right (lightly touching) for stair ascent and no railing during stair descent.   Wheelchair Mobility    Modified Rankin (Stroke Patients Only) Modified Rankin (Stroke Patients Only) Pre-Morbid Rankin Score: No symptoms Modified Rankin: No symptoms     Balance Overall balance assessment: Independent Sitting-balance support: No upper extremity supported;Feet supported Sitting balance-Leahy Scale: Normal Sitting balance - Comments: Pt demonstrates sitting in chair without UE support and able to perform dynamic tasks (reaching outside BOS for objects) without LOB.      Standing balance-Leahy Scale: Good Standing balance comment: Pt able to hold tandem stance without external support for 10 seconds bilaterally. Pt able to hold SLS bilaterally for ~ 5 seconds. Pt uses steppage strategy to recover from LOB safely.                              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     Hand Dominance   Dominant Hand: Right    Extremity/Trunk Assessment   Upper Extremity Assessment Upper Extremity Assessment: Overall WFL for tasks assessed;Defer to OT evaluation    Lower Extremity Assessment Lower Extremity Assessment: Overall [facility] for  tasks assessed    Cervical / Trunk Assessment Cervical / Trunk Assessment: Kyphotic  Communication   Communication: No difficulties  Cognition Arousal/Alertness: Awake/alert Behavior During Therapy: WFL for tasks assessed/performed Overall Cognitive  Status: Within Functional Limits for tasks assessed                                        General Comments General comments (skin integrity, edema, etc.): Pt bends over to put on shoes without LOB. Pt is independent prior to admission and continues to be independent. Pt educated on signs of stroke.     Exercises     Assessment/Plan    PT Assessment Patent does not need any further PT services  PT Problem List         PT Treatment Interventions      PT Goals (Current goals can be found in the Care Plan section)  Acute Rehab PT Goals Patient Stated Goal: return home PT Goal Formulation: All assessment and education complete, DC therapy    Frequency     Barriers to discharge        Co-evaluation               AM-PAC PT "6 Clicks" Daily Activity  Outcome Measure Difficulty turning over in bed (including adjusting bedclothes, sheets and blankets)?: None Difficulty moving from lying on back to sitting on the side of the bed? : None Difficulty sitting down on and standing up from a chair with arms (e.g., wheelchair, bedside commode, etc,.)?: None Help needed moving to and from a bed to chair (including a wheelchair)?: None Help needed walking in hospital room?: None Help needed climbing 3-5 steps with a railing? : None 6 Click Score: 24    End of Session Equipment Utilized During Treatment: Gait belt Activity Tolerance: Patient tolerated treatment well Patient left: in chair;with call bell/phone within reach Nurse Communication: Mobility status      Time: 1275-1700 PT Time Calculation (min) (ACUTE ONLY): 13 min   Charges:   PT Evaluation $PT Eval Low Complexity: 1 Low     PT G Codes:   PT G-Codes **NOT FOR INPATIENT CLASS** Functional Assessment Tool Used: Clinical judgement Functional Limitation: Mobility: Walking and moving around Mobility: Walking and Moving Around Current Status (F7494): 0 percent impaired, limited or  restricted Mobility: Walking and Moving Around Goal Status (W9675): 0 percent impaired, limited or restricted Mobility: Walking and Moving Around Discharge Status [phone]): 0 percent impaired, limited or restricted    Judee Clara, SPT  Judee Clara 01/11/2017, 11:40 AM

## 2017-01-11 NOTE — Progress Notes (Signed)
NEUROHOSPITALISTS STROKE TEAM - DAILY PROGRESS NOTE   ADMISSION HISTORY: Chad Byrd is a 83 y.o. male who has a past medical history of coronary artery disease, hypertension and hyperlipidemia, who was in his usual state of health until this morning when he had an episode of numbness and weakness on the left side of his body that lasted for about an hour.  He also noted numbness of his face and also felt some facial twitching on the left side.  He has had a similar episode 2 weeks ago, again that was very transient in nature. Currently, he does not report of any symptoms.  When he arrived in the ER, he did have some residual facial numbness that has since resolved. He denies any headaches at this time.  He denies any similar episodes in the past.  He denies any chest pain shortness of breath nausea vomiting. Currently on aspirin, Imdur and rosuvastatin.  Also on lisinopril and metoprolol for his blood pressure.  LKW: 0400 h 01/10/2017 tpa given?: no, outside the window Premorbid modified Rankin scale (mRS):  0 NIHSS 0  SUBJECTIVE (INTERVAL HISTORY) Family is at the bedside. Patient is found laying in bed in NAD. Overall he feels his condition is gradually improving. Voices no new complaints. No new events reported overnight.  Patient states both episodes of left-sided weakness occurred after sleeping.  Each episode lasted approximately 30 minutes.  He was able to move his arm and move his fingers with adequate strength during these episodes.  He does remember having some facial numbness and mild headache behind his left eye during each episode.  OBJECTIVE Lab Results: CBC:  Recent Labs  Lab 01/10/17 1322 01/10/17 1336  WBC 4.9  --   HGB 14.8 15.6  HCT 45.1 46.0  MCV 92.6  --   PLT 161  --    BMP: Recent Labs  Lab 01/10/17 1322 01/10/17 1336  NA 138 141  K 3.9 3.9  CL 106 102  CO2 25  --   GLUCOSE 149* 152*  BUN  17 20  CREATININE 1.08 1.00  CALCIUM 9.0  --    Liver Function Tests:  Recent Labs  Lab 01/10/17 1322  AST 28  ALT 21  ALKPHOS 55  BILITOT 0.4  PROT 6.9  ALBUMIN 3.7   Coagulation Studies:  Recent Labs    01/10/17 1322  APTT 31  INR 1.11   PHYSICAL EXAM Temp:  [98 F (36.7 C)-98.7 F (37.1 C)] 98 F (36.7 C) (12/13 0935) Pulse Rate:  [49-72] 70 (12/13 0935) Resp:  [10-18] 16 (12/13 0935) BP: (109-153)/(51-75) 132/66 (12/13 0935) SpO2:  [95 %-100 %] 98 % (12/13 0935) Weight:  [86.9 kg (191 lb 9.3 oz)] 86.9 kg (191 lb 9.3 oz) (12/12 2315) General - Well nourished, well developed, in no apparent distress Respiratory - Lungs clear bilaterally. No wheezing. Cardiovascular - Regular rate and rhythm   GENERAL: Awake, alert in NAD HEENT: - Normocephalic and atraumatic, dry mm, no LN++, no Thyromegally LUNGS - Clear to auscultation bilaterally with no wheezes CV - S1S2 RRR, no m/r/g, equal pulses bilaterally. ABDOMEN - Soft, nontender, nondistended with normoactive BS Ext: warm, well perfused, intact peripheral pulses, no edema NEURO:  Mental Status: AA&Ox3  Language: speech is clear.  Naming, repetition, fluency, and comprehension intact. Cranial Nerves: PERRL 36mm/brisk. EOMI, visual fields full, no facial asymmetry,   facial sensation intact, hearing intact, tongue/uvula/soft palate midline, normal sternocleidomastoid and trapezius muscle strength. No evidence of tongue atrophy  or fibrillations Motor: 5/5 strength bilaterally with normal tone and normal range of motion. Tone: is normal and bulk is normal Sensation- Intact to light touch bilaterally Coordination: FTN intact bilaterally, no ataxia in BLE. Gait- deferred  IMAGING: I have personally reviewed the radiological images below and agree with the radiology interpretations.  Dg Chest 2 View Result Date: 01/10/2017 IMPRESSION: Mild cardiomegaly with aortic atherosclerosis. No active pulmonary disease.  Electronically Signed   By: Ashley Royalty M.D.   On: 01/10/2017 22:42   Ct Head Wo Contrast Result Date: 01/10/2017 IMPRESSION: Atrophy with mild periventricular small vessel disease. No acute infarct evident. No mass or hemorrhage. There are foci of arterial vascular calcification. There are foci of paranasal sinus disease. Electronically Signed   By: Lowella Grip III M.D.   On: 01/10/2017 13:50   Ct Angio Head & Neck W And/or Wo Contrast Result Date: 01/10/2017 IMPRESSION: 1. No emergent large vessel occlusion or high-grade stenosis. 2. Attenuated vascularity within the distal right MCA distribution. No proximal or distal occlusion. Moderate narrowing of the distal M1 segment of the right MCA. 3. Bilateral carotid bifurcation atherosclerosis without hemodynamically significant stenosis. 4.  Aortic Atherosclerosis (ICD10-I70.0). Electronically Signed   By: Ulyses Jarred M.D.   On: 01/10/2017 22:17   Echocardiogram:  Study Conclusions - Left ventricle: The cavity size was normal. Wall thickness was   normal. Systolic function was normal. The estimated ejection   fraction was in the range of 55% to 60%. Doppler parameters are   consistent with abnormal left ventricular relaxation (grade 1   diastolic dysfunction). - Aortic valve: There was mild regurgitation     ASSESSMENT: Mr. Chad Byrd is a 83 y.o. male with PMH of history of hypertension, coronary artery disease, who presents for evaluation of left-sided numbness and possible weakness that lasted for about 1 hour this morning and has since completely resolved.  He had similar episodes on the same side about 2 weeks ago.  Likely  Right brainTIA:  Suspected Etiology: Likely small vessel disease Resultant Symptoms: left-sided numbness and possible weakness Stroke Risk Factors: diabetes mellitus, hyperlipidemia and hypertension Other Stroke Risk Factors: Advanced age,Coronary artery disease  Outstanding Stroke Work-up Studies:      Workup completed  01/11/2017: Neuro exam stable -left-sided numbness mostly resolved. Patient unable to get MRI due to claustrophobia.  CT head negative for acute infarct.  CTA head and neck negative for large vessel occlusion or high-grade stenosis.  Echocardiogram with no PFO or embolus noted.  May discharge patient home on Plavix and Crestor 5 mg daily.  Patient has been taking Crestor 5 mg every other day due to myalgias he appears to be statin intolerant and will likely need PC SK 9 inhibitors at PCP follow-up.  PLAN  01/11/2017: Continue Plavix / Statin Frequent neuro checks Telemetry monitoring PT/OT/SLP Consult PM & Rehab Consult Case Management -for PCP Ongoing aggressive stroke risk factor management Patient counseled to be compliant with his antithrombotic medications Follow [facility] with [facility] in 6 weeks  Carotid and Aortic Atherosclerosis: Plavix alone for now with Lifestyle modifications  HYPERTENSION: Stable Permissive hypertension (OK if <220/120) for 24-48 hours post stroke and then gradually normalized within 5-7 days. Long term BP goal normotensive. May slowly restart home B/P medications after 48 hours Home Meds: Metoprolol, lisinopril, Imdur  HYPERLIPIDEMIA:    Component Value Date/Time   CHOL 153 01/11/2017 0444   CHOL 171 04/10/2016 0956   CHOL 203 (H) 09/29/2015 1039  TRIG 148 01/11/2017 0444   TRIG 127 09/29/2015 1039   HDL 38 (L) 01/11/2017 0444   HDL 40 04/10/2016 0956   HDL 36 (L) 09/29/2015 1039   CHOLHDL 4.0 01/11/2017 0444   VLDL 30 01/11/2017 0444   LDLCALC 85 01/11/2017 0444   LDLCALC 98 04/10/2016 0956   LDLCALC 142 (H) 09/29/2015 1039  Home Meds:  Crestor 5 mg QOD LDL  goal < 70 Continued on Crestor 5 mg daily Continue statin at discharge Statin intolerant - Will likely need PCSK9 Inhibitors - Repatha or Praluent at PCP f/u  PRE-DIABETES: Lab Results  Component Value Date   HGBA1C 5.7 (H) 01/11/2017  HgbA1c goal  < 7.0 PCP follow up   Other Active Problems: Principal Problem:   Left sided numbness Active Problems:   Hyperlipidemia   Statin intolerance   TIA (transient ischemic attack)   HTN (hypertension)  Hospital day # 0 VTE prophylaxis: Lovenox  Diet : Diet Heart Room service appropriate? Yes; Fluid consistency: Thin   FAMILY UPDATES: family at bedside  TEAM UPDATES: Debbe Odea, MD     Prior Home Stroke Medications:  aspirin 81 mg daily and Crestor 5 mg every other day  Discharge Stroke Meds:  Please discharge patient on Plavix 75 mg daily and Crestor 5 mg daily for now, Statin intolerant - May need PCSK9 Inhibitors - Repatha or Praluent at pcp f/u   Disposition: Final discharge disposition not confirmed Therapy Recs:      NONE Home Equipment: NONE Follow Up:  Follow-up Information    Garvin Fila, MD. Schedule an appointment as soon as possible for a visit in 6 week(s).   Specialties:  Neurology, Radiology Contact information: [address] [place] [address] [place] Not In -PCP Follow up in 1-2 weeks- Case management aware  Renie Ora Stroke Neurology Team 01/11/2017 2:56 PM I have personally examined this patient, reviewed notes, independently viewed imaging studies, participated in medical decision making and plan of care.ROS completed by me personally and pertinent positives fully documented  I have made any additions or clarifications directly to the above note. Agree with note above. He presented with transient episode of left arm numbness and had a similar episode 2 weeks ago. Probably right brain TIA is due to small vessel disease. Agree with changing aspirin to Plavix and aggressive risk factor modification. Patient is refusing to undergo MRI scan. Long discussion with patient and multiple family members at the bedside and answered questions. Greater than 50% time during this 35 minute visit was spent on  counseling and coordination of care about his TIA discussion about risk modification and stroke prevention and answering questions.  Antony Contras, MD Medical Director [facility] Pager: [phone] 01/11/2017 3:35 PM  To contact Stroke Continuity provider, please refer to [email or web address] After hours, contact General Neurology

## 2017-03-26 ENCOUNTER — Telehealth: Payer: Self-pay | Admitting: Internal Medicine

## 2017-03-26 NOTE — Telephone Encounter (Signed)
Spoke with pt, he reports itching for some time now and in the last 2 weeks it has gotten worse. The last medication that was started was rosuvastatin. Advised the patient to stop the rosuvastatin and give it about one week to see if symptoms change. He will take benadryl for the itching. He will call back to report how he is doing.

## 2017-03-26 NOTE — Telephone Encounter (Signed)
Pt c/o medication issue:  1. Name of Medication: isosorbide mononitrate (IMDUR) 30 MG 24 hr tablet Take 1 tablet (30 mg total) by mouth daily.  lisinopril (PRINIVIL,ZESTRIL) 5 MG tablet Take 5 mg by mouth daily.  metoprolol succinate (TOPROL-XL) 25 MG 24 hr tablet   Take 1 tablet (25 mg total) by mouth daily.  rosuvastatin (CRESTOR) 5 MG tablet  Take 5 mg by mouth every other day.   2. How are you currently taking this medication (dosage and times per day)? Take 1 tablet (30 mg total) by mouth daily.  Take 5 mg by mouth daily.  Take 1 tablet (25 mg total) by mouth daily. Take 5 mg by mouth every other day.   3. Are you having a reaction (difficulty breathing--STAT)? no  4. What is your medication issue? All medications are making pt itch profusely and its keeping him form sleeping

## 2017-03-27 ENCOUNTER — Ambulatory Visit: Payer: Self-pay | Admitting: Neurology

## 2017-04-10 DIAGNOSIS — C61 Malignant neoplasm of prostate: Secondary | ICD-10-CM | POA: Diagnosis not present

## 2017-04-20 DIAGNOSIS — C61 Malignant neoplasm of prostate: Secondary | ICD-10-CM | POA: Diagnosis not present

## 2017-04-20 DIAGNOSIS — N312 Flaccid neuropathic bladder, not elsewhere classified: Secondary | ICD-10-CM | POA: Diagnosis not present

## 2017-05-04 ENCOUNTER — Other Ambulatory Visit: Payer: Self-pay | Admitting: Internal Medicine

## 2017-05-04 NOTE — Telephone Encounter (Signed)
Rx has been sent to the pharmacy electronically. ° °

## 2017-05-25 ENCOUNTER — Ambulatory Visit (INDEPENDENT_AMBULATORY_CARE_PROVIDER_SITE_OTHER): Payer: Medicare Other | Admitting: Internal Medicine

## 2017-05-25 ENCOUNTER — Encounter: Payer: Self-pay | Admitting: Internal Medicine

## 2017-05-25 VITALS — BP 110/62 | HR 71 | Ht 69.5 in | Wt 190.0 lb

## 2017-05-25 DIAGNOSIS — Z789 Other specified health status: Secondary | ICD-10-CM

## 2017-05-25 DIAGNOSIS — I2510 Atherosclerotic heart disease of native coronary artery without angina pectoris: Secondary | ICD-10-CM

## 2017-05-25 DIAGNOSIS — I712 Thoracic aortic aneurysm, without rupture, unspecified: Secondary | ICD-10-CM

## 2017-05-25 DIAGNOSIS — E785 Hyperlipidemia, unspecified: Secondary | ICD-10-CM | POA: Diagnosis not present

## 2017-05-25 NOTE — Patient Instructions (Signed)
Dr. Debara Pickett recommends Repatha (PCSK9). This is an injectable cholesterol medication. This medication will need prior approval with your insurance company, which we will work on. If the medication is not approved initially, we may need to do an appeal with your insurance. We will keep you updated on this process. This medication can be provided at some local pharmacies or be shipped to your from a specialty pharmacy.   Your physician recommends that you return for lab work [place] after starting the medication & you will follow up with Dr. Debara Pickett at that time. Laurence Ferrari RN, will contact you about this once the medication has been approved with your insurance

## 2017-05-27 ENCOUNTER — Encounter: Payer: Self-pay | Admitting: Internal Medicine

## 2017-05-27 NOTE — Progress Notes (Signed)
OFFICE NOTE  Chief Complaint:  Routine follow-up  Primary Care Physician: Lennette Bihari, NP  HPI:  Chad Byrd is a 84 y.o. male who was previously followed by Dr. Danie Binder with [facility] regional physicians. In December 2015 he presented with acute chest pain and underwent cardiac catheterization. This demonstrated severe proximal circumflex stenosis that was high risk for intervention at a bifurcation. At the time he was on no medication and was placed on a statin, long-acting nitrate and metoprolol. Since then he has been chest pain-free. Unfortunately he had significant side effects including severe muscle weakness and fatigue from pravastatin and discontinued this medication. In the past he's been able to take red [place] without any problems. Recently he has had problems with some hematuria and saw Dr. Alinda Money with urology who ordered a CT of the abdomen and pelvis. This demonstrated aneurysmal dilatation of the descending thoracic aorta measuring 4.9 cm in diameter. The entire thoracic or aorta was not visualized as this was an abdominal study, and it was recommended that repeat CT angiography or MRA would be performed in 6-12 months. 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 He is not get any more short of breath or have any chest pain symptoms with exertion. He reports good control over his blood pressure. He's not had lipid testing in some time.  03/07/2016  Mr. Chad Byrd returns today for follow-up. He had a repeat CT scan demonstrating interval decrease in the size of his ascending thoracic aortic aneurysm from 4.9 cm to 4.4 cm. I suspect this is due to mis-measurement. Blood pressure was noted to be somewhat elevated today 142/70. Given his aneurysm, strict blood pressure control is important. He is on metoprolol succinate 25 mg daily. He has a history of statin intolerance failing 8 atorvastatin and simvastatin in the past. I  started him on ezetimibe and he had a predictable decrease in LDL cholesterol to 111 based on recent testing. This is however very far from goal LDL of less than 70 or lower.  04/06/2016  Mr. Chad Byrd was seen today in follow-up. Unfortunately did not qualify for the [place] 10 trial but he is cholesterol remains elevated. He was placed on ezetimibe and he was noted to be recently much higher than goal. I would like to recheck a lipid profile to see where he is at, but I suspect that he would be a good candidate for PCI skin 9 inhibitor. Blood pressure today is lower than expected with the initiation of 10 mg of lisinopril. He also is reported a little bit of increased fatigue but no dizziness or presyncope.  10/03/2016  Mr. Chad Byrd returns today for follow-up. I'll cholesterol remains well above goal. I recommended referral for PCSK9 inhibitor. He read up on the side effects and feels that is not a good medicine for him. He had significant statin side effects however his primary care provider did convince him to start low-dose rosuvastatin 5 mg every other day. He seems to be tolerating this and has been on it since August. Symptoms may show up fairly soon. He will need a repeat lipid profile in about 2-3 months. He denies any chest pain or worsening shortness of breath. We assessed his thoracic aortic aneurysm this year and will be due for repeat assessment next year. Blood pressure is at goal.  05/25/2017  Mr. Chad Byrd was seen today in follow-up.  Overall he remains asymptomatic.  Blood pressure is at goal  today 110/62.  Lipid profile in December showed total cholesterol 153, triglycerides 148, HDL 38 and LDL 85.  His goal LDL was less than 70.  Again he has been intolerant to all statins.  He was taking low-dose rosuvastatin however says that he can no longer tolerate this.                                                                 PMHx:  Past Medical History:  Diagnosis Date  . Cataract     . Hyperlipidemia     Past Surgical History:  Procedure Laterality Date  . HERNIA REPAIR  1982  . RECTAL SURGERY  mid 1990s    FAMHx:  Family History  Problem Relation Age of Onset  . Alzheimer's disease Brother   . Lung cancer Sister    Parents died of "old age"  SOCHx:   reports that he has never smoked. He has never used smokeless tobacco. He reports that he drinks alcohol. He reports that he does not use drugs.  ALLERGIES:  Allergies  Allergen Reactions  . Atorvastatin Other (See Comments)    Myalgia and causes extreme drowsiness  . Simvastatin Other (See Comments)    Myalgia and extreme drowsiness   . Statins Other (See Comments)    Statins cause muscle aches and extreme drowsiness    ROS: Pertinent items noted in HPI and remainder of comprehensive ROS otherwise negative.  HOME MEDS: Current Outpatient Medications  Medication Sig Dispense Refill  . co-enzyme Q-10 30 MG capsule Take 1 capsule (30 mg total) by mouth daily. 30 capsule 0  . isosorbide mononitrate (IMDUR) 30 MG 24 hr tablet TAKE 1 TABLET BY MOUTH ONCE DAILY 90 tablet 1  . lisinopril (PRINIVIL,ZESTRIL) 5 MG tablet TAKE 1 TABLET BY MOUTH ONCE DAILY 90 tablet 1  . metoprolol succinate (TOPROL-XL) 25 MG 24 hr tablet TAKE 1 TABLET BY MOUTH ONCE DAILY 90 tablet 1  . Multiple Vitamins-Minerals (ONE-A-DAY MENS 50+ ADVANTAGE) TABS Take 1 tablet by mouth daily.     . nitroGLYCERIN (NITROSTAT) 0.4 MG SL tablet Place 1 tablet (0.4 mg total) under the tongue every 5 (five) minutes as needed for chest pain. MAX 3 doses in 15 minutes 25 tablet 3   No current facility-administered medications for this visit.     LABS/IMAGING: No results found for this or any previous visit (from the past 48 hour(s)). No results found.  WEIGHTS: Wt Readings from Last 3 Encounters:  05/25/17 190 lb (86.2 kg)  01/10/17 191 lb 9.3 oz (86.9 kg)  10/03/16 190 lb 9.6 oz (86.5 kg)    VITALS: BP 110/62 (BP Location: Left Arm,  Patient Position: Sitting, Cuff Size: Normal)   Pulse 71   Ht 5' 9.5" (1.765 m)   Wt 190 lb (86.2 kg)   BMI 27.66 kg/m   EXAM: General appearance: alert and no distress Neck: no carotid bruit, no JVD and thyroid not enlarged, symmetric, no tenderness/mass/nodules Lungs: clear to auscultation bilaterally Heart: regular rate and rhythm Abdomen: soft, non-tender; bowel sounds normal; no masses,  no organomegaly Extremities: extremities normal, atraumatic, no cyanosis or edema Pulses: 2+ and symmetric Skin: Skin color, texture, turgor normal. No rashes or lesions Neurologic: Grossly normal Psych: pleasant  EKG: Sinus rhythm 71,  LAFB, minimal voltage criteria for LVH, nonspecific T wave changes-personally reviewed  ASSESSMENT: 1. Coronary artery disease with moderate to severe circumflex bifurcation disease (12/2013) managed medically 2. Thoracic aortic aneurysm measuring 4.4 cm (2018) 3. Dyslipidemia 4. Essential hypertension  PLAN: 1.   Mr. Venard tends to not be at goal LDL less than 70 on his current regimen.  He has been intolerant to statins.  I would recommend a PCSK9 inhibitor.  I demonstrated the use of the Repatha autoinjector in the office today.  We will go ahead and try to achieve prior approval for this.  Pixie Casino, MD, [facility], [place] Director of the Advanced Lipid Disorders &  Cardiovascular Risk Reduction Clinic Diplomate of the American Board of Clinical Lipidology Attending Cardiologist  Direct Dial: [phone]  Fax: [phone]  Website:  [email or web address] Rande Dario 05/27/2017, 9:03 PM

## 2017-05-29 ENCOUNTER — Telehealth: Payer: Self-pay | Admitting: Internal Medicine

## 2017-05-29 NOTE — Telephone Encounter (Signed)
Called patient to notify him that he will need to complete patient assistance application for [place] for Repatha to obtain medication from drug company since he has NO prescription drug coverage - only has pharmacy discount card. He states he is working on application now and will return to office once completed.

## 2017-06-01 NOTE — Telephone Encounter (Signed)
Received application from patient. MD portion has been signed. Completed application submitted to [employer] - fax [phone]

## 2017-06-05 NOTE — Telephone Encounter (Signed)
Called patient and spoke with [name] (DPR). Explained that patient will not qualify for [employer] patient assistance for [place] for 2019 as he was able to get the med in 2018 without having medicare Part D drug coverage but was apparently notified by the company he would need to obtain drug coverage in subsequent years. [name] states she was unaware of this but said she will contact medicare to see how to obtain this for the patient. Asked that she keep me updated as we can resubmit an application once he has coverage and meets the financial criteria.   Will notify MD as Juluis Rainier

## 2017-06-05 NOTE — Telephone Encounter (Signed)
Spoke with [employer]. Patient was denied patient assistance as he was informed last year that patient would have to obtain medicare part D drug coverage when he applied for Repatha patient assistance at this time and was approved. He is ineligible to receive medication free of charge from the drug company unless he obtains part D coverage and then at that point is unable to pay for co-pay and meets patient assistance enrollment criteria.

## 2017-08-08 ENCOUNTER — Encounter: Payer: Self-pay | Admitting: Internal Medicine

## 2017-08-16 DIAGNOSIS — H26493 Other secondary cataract, bilateral: Secondary | ICD-10-CM | POA: Diagnosis not present

## 2017-09-25 DIAGNOSIS — T7840XA Allergy, unspecified, initial encounter: Secondary | ICD-10-CM | POA: Diagnosis not present

## 2017-10-10 ENCOUNTER — Encounter: Payer: Self-pay | Admitting: Adult Health

## 2017-10-10 ENCOUNTER — Ambulatory Visit (INDEPENDENT_AMBULATORY_CARE_PROVIDER_SITE_OTHER): Payer: Medicare Other | Admitting: Adult Health

## 2017-10-10 VITALS — BP 111/65 | HR 71 | Ht 69.5 in | Wt 187.4 lb

## 2017-10-10 DIAGNOSIS — I10 Essential (primary) hypertension: Secondary | ICD-10-CM

## 2017-10-10 DIAGNOSIS — I2510 Atherosclerotic heart disease of native coronary artery without angina pectoris: Secondary | ICD-10-CM | POA: Diagnosis not present

## 2017-10-10 DIAGNOSIS — E7800 Pure hypercholesterolemia, unspecified: Secondary | ICD-10-CM

## 2017-10-10 DIAGNOSIS — W57XXXD Bitten or stung by nonvenomous insect and other nonvenomous arthropods, subsequent encounter: Secondary | ICD-10-CM

## 2017-10-10 DIAGNOSIS — T783XXD Angioneurotic edema, subsequent encounter: Secondary | ICD-10-CM | POA: Diagnosis not present

## 2017-10-10 NOTE — Progress Notes (Signed)
Cardiology Office Note   Date:  10/10/2017   ID:  Chad Byrd, DOB [birth year], MRN [id number]  PCP:  Darryll Capers, NP  Cardiologist:  Dr. Debara Pickett   Chief Complaint  Patient presents with  . Follow-up  . Coronary Artery Disease     History of Present Illness: Chad Byrd is a 84 y.o. male who presents for ongoing assessment and management of CAD, s/p cardiac cath demonstrating severe proximal Cx stenosis that was high risk for intervention at the bifurcation. He was treated medically but has not been tolerant of statins due to severe myalgia. He was recommended for PCSK9 inhibitor. Other history includes thoracic aneurysm most recent measurement of 4.4 cm.   He comes today for follow up after being seen at [place] after having what appears to be a severe allergic reaction. He states he fell asleep in his recliner late in the evening, and woke up to go to bed. As he approached his bed he had severe redness all over his body, itching, feelings of swelling in his mouth and diaphoresis. He took a benadryl and went to ER.   He states he was treated with IV fluids and IV benadryl. An EPI PEN was ordered for OP use should this occur again. He was placed on a steroid dose pack. This caused him to have mouth pain and blisters.   They wanted to follow up with cardiology to make sure this incident did not affect his heart and to make sure that his medications did not cause this. He has not had any new medications from cardiology.   Past Medical History:  Diagnosis Date  . Cataract   . Hyperlipidemia     Past Surgical History:  Procedure Laterality Date  . HERNIA REPAIR  1982  . RECTAL SURGERY  mid 1990s     Current Outpatient Medications  Medication Sig Dispense Refill  . EPINEPHrine 0.3 mg/0.3 mL IJ SOAJ injection INJECTION IN FRONT SIDE THIGH FOR LIFE THREATENING ALLERGIC REACTION  2  . isosorbide mononitrate (IMDUR) 30 MG 24 hr tablet TAKE 1 TABLET BY MOUTH ONCE DAILY  90 tablet 1  . lisinopril (PRINIVIL,ZESTRIL) 5 MG tablet TAKE 1 TABLET BY MOUTH ONCE DAILY 90 tablet 1  . metoprolol succinate (TOPROL-XL) 25 MG 24 hr tablet TAKE 1 TABLET BY MOUTH ONCE DAILY 90 tablet 1  . Multiple Vitamins-Minerals (ONE-A-DAY MENS 50+ ADVANTAGE) TABS Take 1 tablet by mouth daily.     . predniSONE (DELTASONE) 10 MG tablet TAKE 6 TABS BY MOUTH DAILY FOR 2 DAYS THEN TAKE 5 TABS DAILY FOR 2 DAYS THEN TAKE 4 TABS DAILY FOR 2 DAYS THEN TAKE 3 TABS DAILY FOR 2 DAYS  0  . Evolocumab (REPATHA SURECLICK) 009 MG/ML SOAJ Inject 1 Dose into the skin every 14 (fourteen) days.    . nitroGLYCERIN (NITROSTAT) 0.4 MG SL tablet Place 1 tablet (0.4 mg total) under the tongue every 5 (five) minutes as needed for chest pain. MAX 3 doses in 15 minutes 25 tablet 3   No current facility-administered medications for this visit.     Allergies:   Atorvastatin; Simvastatin; and Statins    Social History:  The patient  reports that he has never smoked. He has never used smokeless tobacco. He reports that he drinks alcohol. He reports that he does not use drugs.   Family History:  The patient's family history includes Alzheimer's disease in his brother; Lung cancer in his sister.    ROS: All other  systems are reviewed and negative. Unless otherwise mentioned in H&P    PHYSICAL EXAM: VS:  BP 111/65   Pulse 71   Ht 5' 9.5" (1.765 m)   Wt 187 lb 6.4 oz (85 kg)   BMI 27.28 kg/m  , BMI Body mass index is 27.28 kg/m. GEN: Well nourished, well developed, in no acute distress  HEENT: normal  Neck: no JVD, carotid bruits, or masses Cardiac: RRR; no murmurs, rubs, or gallops,no edema  Respiratory:  clear to auscultation bilaterally, normal work of breathing GI: soft, nontender, nondistended, + BS MS: no deformity or atrophy  Skin: warm and dry, no rash Neuro:  Strength and sensation are intact Psych: euthymic mood, full affect   EKG: NSR rate of 71 bpm. Mild LVH. T-wave flattening in the lateral  leads.   Recent Labs: 01/10/2017: ALT 21; BUN 20; Creatinine, Ser 1.00; Hemoglobin 15.6; Platelets 161; Potassium 3.9; Sodium 141    Lipid Panel    Component Value Date/Time   CHOL 153 Jan 12, 2017 0444   CHOL 171 04/10/2016 0956   CHOL 203 (H) 09/29/2015 1039   TRIG 148 January 12, 2017 0444   TRIG 127 09/29/2015 1039   HDL 38 (L) 01-12-2017 0444   HDL 40 04/10/2016 0956   HDL 36 (L) 09/29/2015 1039   CHOLHDL 4.0 2017/01/12 0444   VLDL 30 January 12, 2017 0444   LDLCALC 85 01/12/2017 0444   LDLCALC 98 04/10/2016 0956   LDLCALC 142 (H) 09/29/2015 1039      Wt Readings from Last 3 Encounters:  10/10/17 187 lb 6.4 oz (85 kg)  05/25/17 190 lb (86.2 kg)  01/10/17 191 lb 9.3 oz (86.9 kg)      Other studies Reviewed: Echocardiogram 2018/01/12 Left ventricle: The cavity size was normal. Wall thickness was   normal. Systolic function was normal. The estimated ejection   fraction was in the range of 55% to 60%. Doppler parameters are   consistent with abnormal left ventricular relaxation (grade 1   diastolic dysfunction). - Aortic valve: There was mild regurgitation.  ASSESSMENT AND PLAN:  1. Allergic Reaction: Unknown etiology. He states that he is not sure if he was bitten by an insect or not. We are requesting ER records from [facility] ER. I have low suspicion for medication as etiology although lisinopril may cause late allergic reactions. Will check some new labs and echo for evaluation of any changes to his heart function.   2. Hx of CAD : Hx of severe proximal Cx stenosis. Treated medically. No complaints of chest pain or other cardiac symptoms.   3. Hypercholesterolemia: He has not tolerated statins. He cannot afford PCSK9 inhibitors. He is waiting for open enrollment to add drug coverage to medicare. Will need to follow up with lab once we evaluate LV fx.    Current medicines are reviewed at length with the patient today.    Labs/ tests ordered today include: Echo and BMET with  CBC.   Phill Myron. West Pugh, ANP, AACC   10/10/2017 5:05 PM    [facility] [address] [place], [place] [address] Phone: [phone]; Fax: [phone]

## 2017-10-10 NOTE — Patient Instructions (Signed)
Medication Instructions:  NO CHANGES- Your physician recommends that you continue on your current medications as directed. Please refer to the Current Medication list given to you today.  If you need a refill on your cardiac medications before your next appointment, please call your pharmacy.  Labwork: BMET,LYME,CBC W/DIFF TODAY HERE IN OUR OFFICE AT [facility]  Take the provided lab slips with you to the lab for your blood draw.   Testing/Procedures: Echocardiogram - Your physician has requested that you have an echocardiogram. Echocardiography is a painless test that uses sound waves to create images of your heart. It provides your doctor with information about the size and shape of your heart and how well your heart's chambers and valves are working. This procedure takes approximately one hour. There are no restrictions for this procedure. This will be performed at our [facility] location - [address], [address].  Special Instructions: IF YOU HAVE ANY FURTHER REACTIONS STOP LISINOPRIL  Follow-Up: Your physician wants you to follow-up in: [address] [place].   Thank you for choosing [facility] at [facility]!!

## 2017-10-11 LAB — CBC WITH DIFFERENTIAL/PLATELET
BASOS ABS: 0 10*3/uL (ref 0.0–0.2)
Basos: 0 %
EOS (ABSOLUTE): 0.1 10*3/uL (ref 0.0–0.4)
Eos: 2 %
Hematocrit: 46.3 % (ref 37.5–51.0)
Hemoglobin: 15.5 g/dL (ref 13.0–17.7)
IMMATURE GRANS (ABS): 0.1 10*3/uL (ref 0.0–0.1)
Immature Granulocytes: 1 %
LYMPHS: 16 %
Lymphocytes Absolute: 1.2 10*3/uL (ref 0.7–3.1)
MCH: 30.5 pg (ref 26.6–33.0)
MCHC: 33.5 g/dL (ref 31.5–35.7)
MCV: 91 fL (ref 79–97)
MONOCYTES: 12 %
MONOS ABS: 0.9 10*3/uL (ref 0.1–0.9)
NEUTROS ABS: 5.2 10*3/uL (ref 1.4–7.0)
NEUTROS PCT: 69 %
PLATELETS: 144 10*3/uL — AB (ref 150–450)
RBC: 5.09 x10E6/uL (ref 4.14–5.80)
RDW: 13 % (ref 12.3–15.4)
WBC: 7.5 10*3/uL (ref 3.4–10.8)

## 2017-10-11 LAB — BASIC METABOLIC PANEL
BUN / CREAT RATIO: 15 (ref 10–24)
BUN: 21 mg/dL (ref 8–27)
CO2: 27 mmol/L (ref 20–29)
Calcium: 9 mg/dL (ref 8.6–10.2)
Chloride: 102 mmol/L (ref 96–106)
Creatinine, Ser: 1.39 mg/dL — ABNORMAL HIGH (ref 0.76–1.27)
GFR calc Af Amer: 53 mL/min/{1.73_m2} — ABNORMAL LOW (ref >59)
GFR, EST NON AFRICAN AMERICAN: 46 mL/min/{1.73_m2} — AB (ref >59)
GLUCOSE: 93 mg/dL (ref 65–99)
Potassium: 4.6 mmol/L (ref 3.5–5.2)
SODIUM: 141 mmol/L (ref 134–144)

## 2017-10-15 ENCOUNTER — Other Ambulatory Visit: Payer: Self-pay

## 2017-10-15 ENCOUNTER — Ambulatory Visit (HOSPITAL_COMMUNITY): Payer: Medicare Other | Attending: Cardiology

## 2017-10-15 DIAGNOSIS — E785 Hyperlipidemia, unspecified: Secondary | ICD-10-CM | POA: Insufficient documentation

## 2017-10-15 DIAGNOSIS — I351 Nonrheumatic aortic (valve) insufficiency: Secondary | ICD-10-CM | POA: Insufficient documentation

## 2017-10-15 DIAGNOSIS — G459 Transient cerebral ischemic attack, unspecified: Secondary | ICD-10-CM | POA: Insufficient documentation

## 2017-10-15 DIAGNOSIS — W57XXXD Bitten or stung by nonvenomous insect and other nonvenomous arthropods, subsequent encounter: Secondary | ICD-10-CM | POA: Diagnosis not present

## 2017-10-15 DIAGNOSIS — I10 Essential (primary) hypertension: Secondary | ICD-10-CM | POA: Diagnosis not present

## 2017-10-15 DIAGNOSIS — I2510 Atherosclerotic heart disease of native coronary artery without angina pectoris: Secondary | ICD-10-CM | POA: Diagnosis not present

## 2017-10-25 DIAGNOSIS — R490 Dysphonia: Secondary | ICD-10-CM | POA: Diagnosis not present

## 2017-10-25 DIAGNOSIS — J383 Other diseases of vocal cords: Secondary | ICD-10-CM | POA: Diagnosis not present

## 2017-10-25 DIAGNOSIS — H6121 Impacted cerumen, right ear: Secondary | ICD-10-CM | POA: Diagnosis not present

## 2017-10-25 DIAGNOSIS — J343 Hypertrophy of nasal turbinates: Secondary | ICD-10-CM | POA: Diagnosis not present

## 2017-10-25 DIAGNOSIS — J342 Deviated nasal septum: Secondary | ICD-10-CM | POA: Diagnosis not present

## 2017-10-25 DIAGNOSIS — R0989 Other specified symptoms and signs involving the circulatory and respiratory systems: Secondary | ICD-10-CM | POA: Diagnosis not present

## 2017-10-25 DIAGNOSIS — J3489 Other specified disorders of nose and nasal sinuses: Secondary | ICD-10-CM | POA: Diagnosis not present

## 2017-10-25 DIAGNOSIS — H9191 Unspecified hearing loss, right ear: Secondary | ICD-10-CM | POA: Diagnosis not present

## 2017-10-25 DIAGNOSIS — R05 Cough: Secondary | ICD-10-CM | POA: Diagnosis not present

## 2017-11-05 DIAGNOSIS — H6121 Impacted cerumen, right ear: Secondary | ICD-10-CM | POA: Diagnosis not present

## 2017-11-13 DIAGNOSIS — L570 Actinic keratosis: Secondary | ICD-10-CM | POA: Diagnosis not present

## 2017-11-13 DIAGNOSIS — B353 Tinea pedis: Secondary | ICD-10-CM | POA: Diagnosis not present

## 2017-11-13 DIAGNOSIS — L821 Other seborrheic keratosis: Secondary | ICD-10-CM | POA: Diagnosis not present

## 2017-11-13 DIAGNOSIS — B351 Tinea unguium: Secondary | ICD-10-CM | POA: Diagnosis not present

## 2017-11-14 ENCOUNTER — Ambulatory Visit (INDEPENDENT_AMBULATORY_CARE_PROVIDER_SITE_OTHER): Payer: Medicare Other | Admitting: Internal Medicine

## 2017-11-14 ENCOUNTER — Encounter: Payer: Self-pay | Admitting: Internal Medicine

## 2017-11-14 VITALS — BP 124/74 | HR 71 | Ht 69.5 in | Wt 191.0 lb

## 2017-11-14 DIAGNOSIS — I351 Nonrheumatic aortic (valve) insufficiency: Secondary | ICD-10-CM | POA: Diagnosis not present

## 2017-11-14 DIAGNOSIS — I2510 Atherosclerotic heart disease of native coronary artery without angina pectoris: Secondary | ICD-10-CM | POA: Diagnosis not present

## 2017-11-14 DIAGNOSIS — I712 Thoracic aortic aneurysm, without rupture, unspecified: Secondary | ICD-10-CM

## 2017-11-14 DIAGNOSIS — Z79899 Other long term (current) drug therapy: Secondary | ICD-10-CM

## 2017-11-14 DIAGNOSIS — E785 Hyperlipidemia, unspecified: Secondary | ICD-10-CM

## 2017-11-14 NOTE — Patient Instructions (Signed)
Medication Instructions:  Continue current medications If you need a refill on your cardiac medications before your next appointment, please call your pharmacy.   Lab work: FASTING lab work - BMET, lipid panel This can be done @ any [facility] This will need to be done prior to your CT test If you have labs (blood work) drawn today and your tests are completely normal, you will receive your results only by: [name] MyChart Message (if you have MyChart) OR . A paper copy in the mail If you have any lab test that is abnormal or we need to change your treatment, we will call you to review the results.  Testing/Procedures: CT angiogram chest/aorta to evaluate thoracic aortic aneurysm  -- done at [address] - [address] Floor  Follow-Up: At [facility], you and your health needs are our priority.  As part of our continuing mission to provide you with exceptional heart care, we have created designated Provider Care Teams.  These Care Teams include your primary Cardiologist (physician) and Advanced Practice Providers (APPs -  Physician Assistants and Nurse Practitioners) who all work together to provide you with the care you need, when you need it. You will need a follow up appointment in 12 months.  Please call our office 2 months in advance to schedule this appointment.  You may see Pixie Casino, MD or one of the following Advanced Practice Providers on your designated Care Team: [place], [place] . Fabian Sharp, PA-C  Any Other Special Instructions Will Be Listed Below (If Applicable).

## 2017-11-14 NOTE — Progress Notes (Signed)
OFFICE NOTE  Chief Complaint:  Routine follow-up  Primary Care Physician: Darryll Capers, NP  HPI:  Chad Byrd is a 84 y.o. male who was previously followed by Dr. Danie Byrd with [facility] physicians. In December 2015 he presented with acute chest pain and underwent cardiac catheterization. This demonstrated severe proximal circumflex stenosis that was high risk for intervention at a bifurcation. At the time he was on no medication and was placed on a statin, long-acting nitrate and metoprolol. Since then he has been chest pain-free. Unfortunately he had significant side effects including severe muscle weakness and fatigue from pravastatin and discontinued this medication. In the past he's been able to take [place] without any problems. Recently he has had problems with some hematuria and saw Dr. Alinda Money with urology who ordered a CT of the abdomen and pelvis. This demonstrated aneurysmal dilatation of the descending thoracic aorta measuring 4.9 cm in diameter. The entire thoracic or aorta was not visualized as this was an abdominal study, and it was recommended that repeat CT angiography or MRA would be performed in 6-12 months. 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 He is not get any more short of breath or have any chest pain symptoms with exertion. He reports good control over his blood pressure. He's not had lipid testing in some time.  03/07/2016  Chad Byrd returns today for follow-up. He had a repeat CT scan demonstrating interval decrease in the size of his ascending thoracic aortic aneurysm from 4.9 cm to 4.4 cm. I suspect this is due to mis-measurement. Blood pressure was noted to be somewhat elevated today 142/70. Given his aneurysm, strict blood pressure control is important. He is on metoprolol succinate 25 mg daily. He has a history of statin intolerance failing 8 atorvastatin and simvastatin in the past. I  started him on ezetimibe and he had a predictable decrease in LDL cholesterol to 111 based on recent testing. This is however very far from goal LDL of less than 70 or lower.  04/06/2016  Chad Byrd was seen today in follow-up. Unfortunately did not qualify for the [place] 10 trial but he is cholesterol remains elevated. He was placed on ezetimibe and he was noted to be recently much higher than goal. I would like to recheck a lipid profile to see where he is at, but I suspect that he would be a good candidate for PCI skin 9 inhibitor. Blood pressure today is lower than expected with the initiation of 10 mg of lisinopril. He also is reported a little bit of increased fatigue but no dizziness or presyncope.  10/03/2016  Chad Byrd returns today for follow-up. I'll cholesterol remains well above goal. I recommended referral for PCSK9 inhibitor. He read up on the side effects and feels that is not a good medicine for him. He had significant statin side effects however his primary care provider did convince him to start low-dose rosuvastatin 5 mg every other day. He seems to be tolerating this and has been on it since August. Symptoms may show up fairly soon. He will need a repeat lipid profile in about 2-3 months. He denies any chest pain or worsening shortness of breath. We assessed his thoracic aortic aneurysm this year and will be due for repeat assessment next year. Blood pressure is at goal.  05/25/2017  Chad Byrd was seen today in follow-up.  Overall he remains asymptomatic.  Blood pressure is at  goal today 110/62.  Lipid profile in December showed total cholesterol 153, triglycerides 148, HDL 38 and LDL 85.  His goal LDL was less than 70.  Again he has been intolerant to all statins.  He was taking low-dose rosuvastatin however says that he can no longer tolerate this.                       11/14/2017  Chad Byrd Setting is seen today in follow-up.  He recently had an allergic reaction in  August.  The etiology is unclear but it required some Benadryl and had head to toe rash.  Subsequently he was seen in September by Leonia Reader, DNP, who ordered an echocardiogram to make sure there is no cardiac involvement of his allergic reaction and and lab work including CBC and be met.  Those were essentially normal.  The echo showed low normal LVEF 50 to 55% with normal regional wall motion and mild to moderate AI.  The ascending aortic diameter was not mentioned.  He has not had any further allergy.  He is asymptomatic and denies chest pain or worsening shortness of breath.  We were not able to pursue PCSK9 inhibitor therapy as he had no prescription drug insurance.  He said he is going to pursue that now during open enrollment.  As his recent LDL was 85, he may be able to reach target LDL less than 70 on ezetimibe but we would need to repeat lipids as his last study was 10 months ago.                                     PMHx:  Past Medical History:  Diagnosis Date  . Cataract   . Hyperlipidemia     Past Surgical History:  Procedure Laterality Date  . HERNIA REPAIR  1982  . RECTAL SURGERY  mid 1990s    FAMHx:  Family History  Problem Relation Age of Onset  . Alzheimer's disease Brother   . Lung cancer Sister    Parents died of "old age"  SOCHx:   reports that he has never smoked. He has never used smokeless tobacco. He reports that he drinks alcohol. He reports that he does not use drugs.  ALLERGIES:  Allergies  Allergen Reactions  . Atorvastatin Other (See Comments)    Myalgia and causes extreme drowsiness  . Simvastatin Other (See Comments)    Myalgia and extreme drowsiness   . Statins Other (See Comments)    Statins cause muscle aches and extreme drowsiness    ROS: Pertinent items noted in HPI and remainder of comprehensive ROS otherwise negative.  HOME MEDS: Current Outpatient Medications  Medication Sig Dispense Refill  . EPINEPHrine 0.3 mg/0.3 mL IJ SOAJ  injection INJECTION IN FRONT SIDE THIGH FOR LIFE THREATENING ALLERGIC REACTION  2  . isosorbide mononitrate (IMDUR) 30 MG 24 hr tablet TAKE 1 TABLET BY MOUTH ONCE DAILY 90 tablet 1  . lisinopril (PRINIVIL,ZESTRIL) 5 MG tablet TAKE 1 TABLET BY MOUTH ONCE DAILY 90 tablet 1  . metoprolol succinate (TOPROL-XL) 25 MG 24 hr tablet TAKE 1 TABLET BY MOUTH ONCE DAILY 90 tablet 1  . Multiple Vitamins-Minerals (ONE-A-DAY MENS 50+ ADVANTAGE) TABS Take 1 tablet by mouth daily.     . nitroGLYCERIN (NITROSTAT) 0.4 MG SL tablet Place 0.4 mg under the tongue every 5 (five) minutes as needed for chest pain.  No current facility-administered medications for this visit.     LABS/IMAGING: No results found for this or any previous visit (from the past 48 hour(s)). No results found.  WEIGHTS: Wt Readings from Last 3 Encounters:  11/14/17 191 lb (86.6 kg)  10/10/17 187 lb 6.4 oz (85 kg)  05/25/17 190 lb (86.2 kg)    VITALS: BP 124/74   Pulse 71   Ht 5' 9.5" (1.765 m)   Wt 191 lb (86.6 kg)   BMI 27.80 kg/m   EXAM: General appearance: alert and no distress Neck: no carotid bruit, no JVD and thyroid not enlarged, symmetric, no tenderness/mass/nodules Lungs: clear to auscultation bilaterally Heart: regular rate and rhythm Abdomen: soft, non-tender; bowel sounds normal; no masses,  no organomegaly Extremities: extremities normal, atraumatic, no cyanosis or edema Pulses: 2+ and symmetric Skin: Skin color, texture, turgor normal. No rashes or lesions Neurologic: Grossly normal Psych: pleasant  EKG: Normal sinus rhythm 71, LAFB, minimal voltage criteria for LVH-personally reviewed  ASSESSMENT: 1. Coronary artery disease with moderate to severe circumflex bifurcation disease (12/2013) managed medically 2. Thoracic aortic aneurysm measuring 4.4 cm (2018) 3. Dyslipidemia 4. Essential hypertension 5. Statin intolerance 6. Mild to moderate AI  PLAN: 1.   Mr. Springborn is due for repeat assessment  of thoracic aortic aneurysm.  Will obtain a be met and a lipid profile.  His goal LDL is less than 70 and he was not at that level and has been intolerant to statins.  Hopefully we can achieve his goal on ezetimibe.  If not he may be candidate for PCSK9 inhibitor.  He will have to obtain prescription drug coverage which she is applying for.  Plan follow-up with me annually or sooner as necessary.  Pixie Casino, MD, [facility], [employer] Director of the Advanced Lipid Disorders &  Cardiovascular Risk Reduction Clinic Diplomate of the American Board of Clinical Lipidology Attending Cardiologist  Direct Dial: [phone]  Fax: [phone]  Website:  [email or web address] 11/14/2017, 10:08 AM

## 2017-11-16 DIAGNOSIS — I2510 Atherosclerotic heart disease of native coronary artery without angina pectoris: Secondary | ICD-10-CM | POA: Diagnosis not present

## 2017-11-16 DIAGNOSIS — E785 Hyperlipidemia, unspecified: Secondary | ICD-10-CM | POA: Diagnosis not present

## 2017-11-16 DIAGNOSIS — Z79899 Other long term (current) drug therapy: Secondary | ICD-10-CM | POA: Diagnosis not present

## 2017-11-18 LAB — BASIC METABOLIC PANEL
BUN/Creatinine Ratio: 21 (ref 10–24)
BUN: 25 mg/dL (ref 8–27)
CALCIUM: 9.4 mg/dL (ref 8.6–10.2)
CO2: 24 mmol/L (ref 20–29)
CREATININE: 1.18 mg/dL (ref 0.76–1.27)
Chloride: 103 mmol/L (ref 96–106)
GFR calc Af Amer: 64 mL/min/{1.73_m2} (ref >59)
GFR calc non Af Amer: 55 mL/min/{1.73_m2} — ABNORMAL LOW (ref >59)
GLUCOSE: 96 mg/dL (ref 65–99)
POTASSIUM: 4.5 mmol/L (ref 3.5–5.2)
SODIUM: 140 mmol/L (ref 134–144)

## 2017-11-18 LAB — LIPID PANEL
Chol/HDL Ratio: 5.1 ratio — ABNORMAL HIGH (ref 0.0–5.0)
Cholesterol, Total: 236 mg/dL — ABNORMAL HIGH (ref 100–199)
HDL: 46 mg/dL (ref >39)
LDL CALC: 169 mg/dL — AB (ref 0–99)
TRIGLYCERIDES: 103 mg/dL (ref 0–149)
VLDL Cholesterol Cal: 21 mg/dL (ref 5–40)

## 2017-11-19 ENCOUNTER — Telehealth: Payer: Self-pay | Admitting: Internal Medicine

## 2017-11-19 DIAGNOSIS — E782 Mixed hyperlipidemia: Secondary | ICD-10-CM

## 2017-11-19 MED ORDER — EZETIMIBE 10 MG PO TABS: 10.0000 mg | ORAL_TABLET | Freq: Every day | ORAL | 3 refills | Status: DC

## 2017-11-19 NOTE — Telephone Encounter (Signed)
Spoke with patient/Chad Byrd about lab results.   Rx(s) sent to pharmacy electronically. Per MD 11/14/17 note: As his recent LDL was 85, he may be able to reach target LDL less than 70 on ezetimibe but we would need to repeat lipids as his last study was 10 months ago.       Lab ordered/mailed

## 2017-11-21 DIAGNOSIS — C61 Malignant neoplasm of prostate: Secondary | ICD-10-CM | POA: Diagnosis not present

## 2017-11-28 DIAGNOSIS — N312 Flaccid neuropathic bladder, not elsewhere classified: Secondary | ICD-10-CM | POA: Diagnosis not present

## 2017-11-28 DIAGNOSIS — C61 Malignant neoplasm of prostate: Secondary | ICD-10-CM | POA: Diagnosis not present

## 2017-11-29 ENCOUNTER — Ambulatory Visit (INDEPENDENT_AMBULATORY_CARE_PROVIDER_SITE_OTHER)
Admission: RE | Admit: 2017-11-29 | Discharge: 2017-11-29 | Disposition: A | Discharge status: Home / Self Care | Payer: Medicare Other | Source: Ambulatory Visit | Attending: Internal Medicine | Admitting: Internal Medicine

## 2017-11-29 DIAGNOSIS — I712 Thoracic aortic aneurysm, without rupture, unspecified: Secondary | ICD-10-CM

## 2017-11-29 MED ORDER — IOPAMIDOL (ISOVUE-370) INJECTION 76%
100.0000 mL | Freq: Once | INTRAVENOUS | Status: AC | PRN
  Administered 2017-11-29: 100 mL via INTRAVENOUS

## 2017-11-30 ENCOUNTER — Other Ambulatory Visit: Payer: Self-pay | Admitting: *Deleted

## 2017-11-30 DIAGNOSIS — I712 Thoracic aortic aneurysm, without rupture, unspecified: Secondary | ICD-10-CM

## 2017-12-03 ENCOUNTER — Other Ambulatory Visit: Payer: Self-pay | Admitting: Internal Medicine

## 2017-12-03 DIAGNOSIS — R310 Gross hematuria: Secondary | ICD-10-CM | POA: Diagnosis not present

## 2017-12-03 NOTE — Telephone Encounter (Signed)
Rx(s) sent to pharmacy electronically.  

## 2017-12-17 DIAGNOSIS — N312 Flaccid neuropathic bladder, not elsewhere classified: Secondary | ICD-10-CM | POA: Diagnosis not present

## 2017-12-17 DIAGNOSIS — R310 Gross hematuria: Secondary | ICD-10-CM | POA: Diagnosis not present

## 2017-12-17 DIAGNOSIS — C61 Malignant neoplasm of prostate: Secondary | ICD-10-CM | POA: Diagnosis not present

## 2018-02-12 ENCOUNTER — Other Ambulatory Visit: Payer: Self-pay | Admitting: Internal Medicine

## 2018-02-12 MED ORDER — LISINOPRIL 5 MG PO TABS: 5.0000 mg | ORAL_TABLET | Freq: Every day | ORAL | 1 refills | Status: DC

## 2018-02-12 NOTE — Telephone Encounter (Signed)
Rx(s) sent to pharmacy electronically.  

## 2018-02-12 NOTE — Telephone Encounter (Signed)
New Message    *STAT* If patient is at the pharmacy, call can be transferred to refill team.   1. Which medications need to be refilled? (please list name of each medication and dose if known) lisinopril (PRINIVIL,ZESTRIL) 5 MG tablet  2. Which pharmacy/location (including street and city if local pharmacy) is medication to be sent to? [facility] - [address], [place], [place] [address] Fax: [phone]  3. Do they need a 30 day or 90 day supply? [place]

## 2018-02-20 DIAGNOSIS — E782 Mixed hyperlipidemia: Secondary | ICD-10-CM | POA: Diagnosis not present

## 2018-02-21 LAB — LIPID PANEL
CHOL/HDL RATIO: 4.4 ratio (ref 0.0–5.0)
Cholesterol, Total: 182 mg/dL (ref 100–199)
HDL: 41 mg/dL (ref >39)
LDL Calculated: 114 mg/dL — ABNORMAL HIGH (ref 0–99)
Triglycerides: 137 mg/dL (ref 0–149)
VLDL Cholesterol Cal: 27 mg/dL (ref 5–40)

## 2018-02-26 ENCOUNTER — Telehealth: Payer: Self-pay | Admitting: Internal Medicine

## 2018-02-26 NOTE — Telephone Encounter (Signed)
New Message ° ° ° °Patient is returning call in reference to lab results. Please call.  °

## 2018-02-26 NOTE — Telephone Encounter (Signed)
Submitted prior auth for Repatha SureClick 140mg /mL via [email or web address]  Key: [id number]

## 2018-02-26 NOTE — Telephone Encounter (Signed)
Returned call to [employer]. Notified her of lab results. She thinks patient would be agreeable to starting Repatha. He was unable to start this last year, as he needed Part D drug coverage, which he now has.   Advised [name] that I would work on PA and keep them notified of status. Once approved, would need to determine copay, and if not affordable, can pursue patient assistance.

## 2018-02-27 MED ORDER — EVOLOCUMAB 140 MG/ML ~~LOC~~ SOAJ: 1.0000 | SUBCUTANEOUS | 11 refills | Status: DC

## 2018-02-27 NOTE — Addendum Note (Signed)
Addended by: Fidel Levy on: 02/27/2018 01:57 PM   Modules accepted: Orders

## 2018-02-27 NOTE — Telephone Encounter (Signed)
Spoke with patient and notified him that Repatha has been approved. Explained that I will send Rx to [facility] and asked that he notify office of co-pay amount and if it is affordable or not. If not, can pursue patient assistance. He voiced understanding.

## 2018-02-27 NOTE — Telephone Encounter (Signed)
Request Reference Number: [id number] [place] 140MG /ML is approved through 08/27/2018

## 2018-03-11 ENCOUNTER — Telehealth: Payer: Self-pay

## 2018-03-11 MED ORDER — EVOLOCUMAB 140 MG/ML ~~LOC~~ SOAJ: 1.0000 | SUBCUTANEOUS | 11 refills | Status: DC

## 2018-03-11 NOTE — Telephone Encounter (Signed)
Called pt to let them know that the pa was approved sent rx to [facility] [place] and gave pt # to [employer]

## 2018-04-08 ENCOUNTER — Emergency Department (HOSPITAL_COMMUNITY): Payer: Medicare Other

## 2018-04-08 ENCOUNTER — Ambulatory Visit (INDEPENDENT_AMBULATORY_CARE_PROVIDER_SITE_OTHER): Payer: Medicare Other | Admitting: Cardiology

## 2018-04-08 ENCOUNTER — Other Ambulatory Visit: Payer: Self-pay

## 2018-04-08 ENCOUNTER — Encounter: Payer: Self-pay | Admitting: Cardiology

## 2018-04-08 ENCOUNTER — Emergency Department (HOSPITAL_COMMUNITY)
Admission: EM | Admit: 2018-04-08 | Discharge: 2018-04-09 | Disposition: A | Discharge status: Home / Self Care | Payer: Medicare Other | Attending: Emergency Medicine | Admitting: Emergency Medicine

## 2018-04-08 ENCOUNTER — Encounter (HOSPITAL_COMMUNITY): Payer: Self-pay | Admitting: Emergency Medicine

## 2018-04-08 VITALS — BP 142/80 | HR 60 | Ht 69.5 in | Wt 198.0 lb

## 2018-04-08 DIAGNOSIS — Z7982 Long term (current) use of aspirin: Secondary | ICD-10-CM | POA: Insufficient documentation

## 2018-04-08 DIAGNOSIS — Z79899 Other long term (current) drug therapy: Secondary | ICD-10-CM | POA: Insufficient documentation

## 2018-04-08 DIAGNOSIS — H538 Other visual disturbances: Secondary | ICD-10-CM | POA: Diagnosis not present

## 2018-04-08 DIAGNOSIS — I10 Essential (primary) hypertension: Secondary | ICD-10-CM | POA: Insufficient documentation

## 2018-04-08 DIAGNOSIS — I2510 Atherosclerotic heart disease of native coronary artery without angina pectoris: Secondary | ICD-10-CM | POA: Diagnosis not present

## 2018-04-08 DIAGNOSIS — G459 Transient cerebral ischemic attack, unspecified: Secondary | ICD-10-CM | POA: Diagnosis not present

## 2018-04-08 DIAGNOSIS — E785 Hyperlipidemia, unspecified: Secondary | ICD-10-CM | POA: Diagnosis not present

## 2018-04-08 DIAGNOSIS — R200 Anesthesia of skin: Secondary | ICD-10-CM | POA: Insufficient documentation

## 2018-04-08 DIAGNOSIS — R202 Paresthesia of skin: Secondary | ICD-10-CM

## 2018-04-08 LAB — DIFFERENTIAL
ABS IMMATURE GRANULOCYTES: 0.01 10*3/uL (ref 0.00–0.07)
BASOS ABS: 0.1 10*3/uL (ref 0.0–0.1)
Basophils Relative: 1 %
EOS ABS: 0.1 10*3/uL (ref 0.0–0.5)
EOS PCT: 2 %
IMMATURE GRANULOCYTES: 0 %
LYMPHS ABS: 1.7 10*3/uL (ref 0.7–4.0)
LYMPHS PCT: 30 %
Monocytes Absolute: 0.5 10*3/uL (ref 0.1–1.0)
Monocytes Relative: 9 %
Neutro Abs: 3.4 10*3/uL (ref 1.7–7.7)
Neutrophils Relative %: 58 %

## 2018-04-08 LAB — CBC
HEMATOCRIT: 48.6 % (ref 39.0–52.0)
Hemoglobin: 15.6 g/dL (ref 13.0–17.0)
MCH: 29.7 pg (ref 26.0–34.0)
MCHC: 32.1 g/dL (ref 30.0–36.0)
MCV: 92.6 fL (ref 80.0–100.0)
NRBC: 0 % (ref 0.0–0.2)
PLATELETS: 178 10*3/uL (ref 150–400)
RBC: 5.25 MIL/uL (ref 4.22–5.81)
RDW: 13.1 % (ref 11.5–15.5)
WBC: 5.8 10*3/uL (ref 4.0–10.5)

## 2018-04-08 LAB — COMPREHENSIVE METABOLIC PANEL
ALT: 24 U/L (ref 0–44)
ANION GAP: 6 (ref 5–15)
AST: 30 U/L (ref 15–41)
Albumin: 3.8 g/dL (ref 3.5–5.0)
Alkaline Phosphatase: 61 U/L (ref 38–126)
BILIRUBIN TOTAL: 0.7 mg/dL (ref 0.3–1.2)
BUN: 23 mg/dL (ref 8–23)
CHLORIDE: 106 mmol/L (ref 98–111)
CO2: 27 mmol/L (ref 22–32)
Calcium: 9.1 mg/dL (ref 8.9–10.3)
Creatinine, Ser: 1.35 mg/dL — ABNORMAL HIGH (ref 0.61–1.24)
GFR calc Af Amer: 54 mL/min — ABNORMAL LOW (ref >60)
GFR calc non Af Amer: 47 mL/min — ABNORMAL LOW (ref >60)
GLUCOSE: 135 mg/dL — AB (ref 70–99)
POTASSIUM: 4.1 mmol/L (ref 3.5–5.1)
SODIUM: 139 mmol/L (ref 135–145)
TOTAL PROTEIN: 6.8 g/dL (ref 6.5–8.1)

## 2018-04-08 LAB — PROTIME-INR
INR: 1.2 (ref 0.8–1.2)
Prothrombin Time: 14.6 seconds (ref 11.4–15.2)

## 2018-04-08 LAB — APTT: aPTT: 30 seconds (ref 24–36)

## 2018-04-08 MED ORDER — PRAVASTATIN SODIUM 20 MG PO TABS: 20.0000 mg | ORAL_TABLET | ORAL | 1 refills | Status: DC

## 2018-04-08 MED ORDER — LORAZEPAM 2 MG/ML IJ SOLN: 0.5000 mg | Freq: Once | INTRAMUSCULAR | Status: DC

## 2018-04-08 MED ORDER — SODIUM CHLORIDE 0.9% FLUSH
3.0000 mL | Freq: Once | INTRAVENOUS | Status: DC
Start: 2018-04-08 — End: 2018-04-09

## 2018-04-08 MED ORDER — LORAZEPAM 1 MG PO TABS
1.0000 mg | ORAL_TABLET | Freq: Once | ORAL | Status: AC
  Administered 2018-04-08: 1 mg via ORAL
  Filled 2018-04-08 (×2): qty 1

## 2018-04-08 NOTE — Progress Notes (Signed)
Cardiology Office Note:    Date:  04/08/2018   ID:  Chad Byrd, DOB 1928/03/15, MRN [id number]  PCP:  Darryll Capers, NP  Cardiologist:  Shirlee More, MD    Referring MD: Darryll Capers, NP    ASSESSMENT:    1. TIA (transient ischemic attack)   2. Essential hypertension   3. Hyperlipidemia, unspecified hyperlipidemia type   4. Coronary artery disease involving native coronary artery of native heart without angina pectoris    PLAN:    In order of problems listed above:  1. December 2018 he had TIA admitted to [facility] is having the same symptomatology since 6 AM his left upper extremity weakness that resolved and dysesthesia that is persistent.  No trouble with speech vision or lower extremity.  The symptoms are ongoing and I told him and his significant other that the best served by going to the emergency room [facility] for quick stroke evaluation.  He has poorly controlled hyperlipidemia Clyda Hurdle go ahead and start him on pravastatin 20 mg every other day along with the Zetia.  Unfortunately he never started on PCSK9 because of cost. 2. Stable continue current antihypertensives avoid hypotension at this time 3. Poorly controlled retry a low intensity statin reduced dose along with Zetia 4. Stable having no anginal discomfort and continue medical treatment and certainly at this time would not initiate coronary ischemia evaluation   Next appointment: As needed after evaluation the emergency room   Medication Adjustments/Labs and Tests Ordered: Current medicines are reviewed at length with the patient today.  Concerns regarding medicines are outlined above.  No orders of the defined types were placed in this encounter.  No orders of the defined types were placed in this encounter.   No chief complaint on file.   History of Present Illness:    Chad Byrd is a 85 y.o. male with a hx of CAD hyperlipidemia statin intolerance aneurysmal dilation of  the descending thoracic aorta 49 mm repeat imaging 44 mm.,  Hypertension last seen by Dr. Debara Pickett 05/25/2017.  He underwent coronary angiography December 2015 showing severe proximal left circumflex stenosis as she has high risk for intervention and was treated medically.  He underwent a an echocardiogram 10/15/2017 showing an ejection fraction of 50-55 percent there is no segmental LV dysfunction mild to moderate aortic regurgitation was seen. Compliance with diet, lifestyle and medications: Yes  This morning he awoke with some weakness left upper extremity dysesthesia the weakness is resolved but the peculiar sensation has persisted.  He was concerned he called our office at [facility] and was referred to see me in [place].  He is having no gait difficulty or trouble with speech no weakness but because of recurrent TIA or stroke symptoms advised him to go the emergency room [facility].  I reviewed the potential for trying a low intensity statin he agrees we will put him on pravastatin 20 mg every other day.[name]  He has had no anginal discomfort palpitation or syncope. Past Medical History:  Diagnosis Date  . Cataract   . Hyperlipidemia     Past Surgical History:  Procedure Laterality Date  . HERNIA REPAIR  1982  . RECTAL SURGERY  mid 1990s    Current Medications: Current Meds  Medication Sig  . aspirin EC 81 MG tablet Take 81 mg by mouth daily.  [name] EPINEPHrine 0.3 mg/0.3 mL IJ SOAJ injection INJECTION IN FRONT SIDE THIGH FOR LIFE THREATENING ALLERGIC REACTION  .  ezetimibe (ZETIA) 10 MG tablet Take 1 tablet (10 mg total) by mouth daily.  . isosorbide mononitrate (IMDUR) 30 MG 24 hr tablet TAKE 1 TABLET BY MOUTH ONCE DAILY  . lisinopril (PRINIVIL,ZESTRIL) 5 MG tablet Take 1 tablet (5 mg total) by mouth daily.  . metoprolol succinate (TOPROL-XL) 25 MG 24 hr tablet TAKE 1 TABLET BY MOUTH ONCE DAILY  . Multiple Vitamins-Minerals (ONE-A-DAY MENS 50+ ADVANTAGE) TABS Take 1 tablet  by mouth daily.   . nitroGLYCERIN (NITROSTAT) 0.4 MG SL tablet Place 0.4 mg under the tongue every 5 (five) minutes as needed for chest pain.     Allergies:   Atorvastatin; Simvastatin; and Statins   Social History   Socioeconomic History  . Marital status: Divorced    Spouse name: [name] = sign. other  . Number of children: 6  . Years of education: 43  . Highest education level: Not on file  Occupational History  . Occupation: Secretary/administrator  Social Needs  . Financial resource strain: Not on file  . Food insecurity:    Worry: Not on file    Inability: Not on file  . Transportation needs:    Medical: Not on file    Non-medical: Not on file  Tobacco Use  . Smoking status: Never Smoker  . Smokeless tobacco: Never Used  Substance and Sexual Activity  . Alcohol use: Yes    Comment: 1-2 oz of Scotch daily  . Drug use: No  . Sexual activity: Not on file  Lifestyle  . Physical activity:    Days per week: Not on file    Minutes per session: Not on file  . Stress: Not on file  Relationships  . Social connections:    Talks on phone: Not on file    Gets together: Not on file    Attends religious service: Not on file    Active member of club or organization: Not on file    Attends meetings of clubs or organizations: Not on file    Relationship status: Not on file  Other Topics Concern  . Not on file  Social History Narrative   epworth sleepiness scale = 10 (09/29/15)   exercise - walk dogs daily, work on farm     Family History: The patient's family history includes Alzheimer's disease in his brother; Lung cancer in his sister. ROS:   Please see the history of present illness.    All other systems reviewed and are negative.  EKGs/Labs/Other Studies Reviewed:    The following studies were reviewed today:  EKG:  EKG ordered today and personally reviewed.  The ekg ordered today demonstrates [place] normal  Recent Labs: 10/10/2017: Hemoglobin 15.5;  Platelets 144 11/16/2017: BUN 25; Creatinine, Ser 1.18; Potassium 4.5; Sodium 140  Recent Lipid Panel    Component Value Date/Time   CHOL 182 02/20/2018 0859   CHOL 203 (H) 09/29/2015 1039   TRIG 137 02/20/2018 0859   TRIG 127 09/29/2015 1039   HDL 41 02/20/2018 0859   HDL 36 (L) 09/29/2015 1039   CHOLHDL 4.4 02/20/2018 0859   CHOLHDL 4.0 01/11/2017 0444   VLDL 30 01/11/2017 0444   LDLCALC 114 (H) 02/20/2018 0859   LDLCALC 142 (H) 09/29/2015 1039    Physical Exam:    VS:  BP (!) 142/80 (BP Location: Right Arm, Patient Position: Sitting, Cuff Size: Large)   Pulse 60   Ht 5' 9.5" (1.765 m)   Wt 198 lb (89.8 kg)   SpO2 98%  BMI 28.82 kg/m     Wt Readings from Last 3 Encounters:  04/08/18 198 lb (89.8 kg)  11/14/17 191 lb (86.6 kg)  10/10/17 187 lb 6.4 oz (85 kg)     GEN:  Well nourished, well developed in no acute distress HEENT: Normal NECK: No JVD; No carotid bruits LYMPHATICS: No lymphadenopathy CARDIAC: RRR, no murmurs, rubs, gallops RESPIRATORY:  Clear to auscultation without rales, wheezing or rhonchi  ABDOMEN: Soft, non-tender, non-distended MUSCULOSKELETAL:  No edema; No deformity  SKIN: Warm and dry NEUROLOGIC:  Alert and oriented x 3 PSYCHIATRIC:  Normal affect    Signed, Shirlee More, MD  04/08/2018 11:13 AM    [place]

## 2018-04-08 NOTE — ED Provider Notes (Signed)
[facility] EMERGENCY DEPARTMENT Provider Note   CSN: [id number] Arrival date & time: 04/08/18  1317    History   Chief Complaint Chief Complaint  Patient presents with  . arm numbness    HPI Chad Byrd is a 85 y.o. male.      Illness  Severity:  Mild Onset quality:  Sudden Duration:  3 hours Timing:  Constant Progression:  Resolved Chronicity:  Recurrent Associated symptoms: no abdominal pain, no chest pain, no cough, no ear pain, no fever, no rash, no shortness of breath, no sore throat and no vomiting      Patient is a 85 year old male with history of hypertension, CAD, prior TIA, who presents the emergency department complaining of left-sided paresthesias involving his left upper extremity and left face this morning when he awoke.  Patient states that symptoms lasted for approximately 3 hours and self resolved.  Patient states he had similar symptoms 1 year ago which was worked up at this hospital for CVA/TIA.  He states that after negative work-up he did not follow back up with neurology.  He states that he is not had any symptoms since that time.  Patient denies any recent fevers, chills, chest pain, shortness of breath, nausea/vomiting/diarrhea, or changes in bowel or bladder habits.  Past Medical History:  Diagnosis Date  . Cataract   . Hyperlipidemia     Patient Active Problem List   Diagnosis Date Noted  . Left sided numbness 01/10/2017  . TIA (transient ischemic attack) 01/10/2017  . HTN (hypertension) 01/10/2017  . Statin intolerance 03/07/2016  . Coronary artery disease involving native coronary artery of native heart without angina pectoris 09/29/2015  . Thoracic aortic aneurysm without rupture ([place]) 09/29/2015  . Hyperlipidemia 09/29/2015    Past Surgical History:  Procedure Laterality Date  . HERNIA REPAIR  1982  . RECTAL SURGERY  mid 1990s        Home Medications    Prior to Admission medications   Medication Sig  Start Date End Date Taking? Authorizing Provider  aspirin EC 81 MG tablet Take 81 mg by mouth daily.   Yes [provider]  EPINEPHrine 0.3 mg/0.3 mL IJ SOAJ injection Inject 0.3 mg into the muscle as needed for anaphylaxis.  09/25/17  Yes [provider]  ezetimibe (ZETIA) 10 MG tablet Take 1 tablet (10 mg total) by mouth daily. 11/19/17 04/08/19 Yes Hilty, Nadean Corwin, MD  isosorbide mononitrate (IMDUR) 30 MG 24 hr tablet TAKE 1 TABLET BY MOUTH ONCE DAILY Patient taking differently: Take 30 mg by mouth daily.  12/03/17  Yes Hilty, Nadean Corwin, MD  lisinopril (PRINIVIL,ZESTRIL) 5 MG tablet Take 1 tablet (5 mg total) by mouth daily. 02/12/18  Yes Hilty, Nadean Corwin, MD  metoprolol succinate (TOPROL-XL) 25 MG 24 hr tablet TAKE 1 TABLET BY MOUTH ONCE DAILY Patient taking differently: Take 25 mg by mouth daily.  12/03/17  Yes Hilty, Nadean Corwin, MD  Multiple Vitamins-Minerals (ONE-A-DAY MENS 50+ ADVANTAGE) TABS Take 1 tablet by mouth daily.    Yes [provider]  nitroGLYCERIN (NITROSTAT) 0.4 MG SL tablet Place 0.4 mg under the tongue every 5 (five) minutes as needed for chest pain.   Yes [provider]  pravastatin (PRAVACHOL) 20 MG tablet Take 1 tablet (20 mg total) by mouth every other day. 04/08/18 07/07/18  Richardo Priest, MD    Family History Family History  Problem Relation Age of Onset  . Alzheimer's disease Brother   . Lung cancer  Sister     Social History Social History   Tobacco Use  . Smoking status: Never Smoker  . Smokeless tobacco: Never Used  Substance Use Topics  . Alcohol use: Yes    Comment: 1-2 oz of Scotch daily  . Drug use: No     Allergies   Atorvastatin; Simvastatin; and Statins   Review of Systems Review of Systems  Constitutional: Negative for chills and fever.  HENT: Negative for ear pain and sore throat.   Eyes: Negative for pain and visual disturbance.  Respiratory: Negative for cough and shortness of breath.   Cardiovascular:  Negative for chest pain and palpitations.  Gastrointestinal: Negative for abdominal pain and vomiting.  Genitourinary: Negative for dysuria and hematuria.  Musculoskeletal: Negative for arthralgias and back pain.  Skin: Negative for color change and rash.  Neurological: Positive for numbness (LUE and Left face). Negative for seizures and syncope.  All other systems reviewed and are negative.    Physical Exam Updated Vital Signs BP (!) 153/84   Pulse 66   Temp 97.9 F (36.6 C) (Oral)   Resp 20   SpO2 100%   Physical Exam Vitals signs and nursing note reviewed.  Constitutional:      Appearance: He is well-developed.  HENT:     Head: Normocephalic and atraumatic.  Eyes:     Conjunctiva/sclera: Conjunctivae normal.  Neck:     Musculoskeletal: Neck supple.  Cardiovascular:     Rate and Rhythm: Normal rate and regular rhythm.     Heart sounds: No murmur.  Pulmonary:     Effort: Pulmonary effort is normal. No respiratory distress.     Breath sounds: Normal breath sounds.  Abdominal:     Palpations: Abdomen is soft.     Tenderness: There is no abdominal tenderness.  Skin:    General: Skin is warm and dry.  Neurological:     Mental Status: He is alert.     Comments: Alert and oriented x3. CN II-XII intact. 5/5 strength in all 4 extremities. Normal finger to nose. Negative pronator drift.       ED Treatments / Results  Labs (all labs ordered are listed, but only abnormal results are displayed) Labs Reviewed  COMPREHENSIVE METABOLIC PANEL - Abnormal; Notable for the following components:      Result Value   Glucose, Bld 135 (*)    Creatinine, Ser 1.35 (*)    GFR calc non Af Amer 47 (*)    GFR calc Af Amer 54 (*)    All other components within normal limits  PROTIME-INR  APTT  CBC  DIFFERENTIAL    EKG EKG Interpretation  Date/Time:  Monday April 08 2018 13:30:01 EDT Ventricular Rate:  74 PR Interval:  168 QRS Duration: 94 QT Interval:  394 QTC  Calculation: 437 R Axis:   -58 Text Interpretation:  Sinus rhythm with occasional Premature ventricular complexes Left anterior fascicular block Minimal voltage criteria for LVH, may be normal variant Abnormal ECG No significant change since last tracing Confirmed by Merrily Pew [phone]) on 04/08/2018 6:57:19 PM   Radiology Ct Head Wo Contrast  Result Date: 04/08/2018 CLINICAL DATA:  Left arm numbness and blurred vision beginning today. EXAM: CT HEAD WITHOUT CONTRAST TECHNIQUE: Contiguous axial images were obtained from the base of the skull through the vertex without intravenous contrast. COMPARISON:  01/10/2017 FINDINGS: Brain: Mild generalized atrophy. No evidence of old or acute focal infarction, mass lesion, hemorrhage, hydrocephalus or extra-axial collection. Vascular: There is atherosclerotic calcification  of the major vessels at the base of the brain. Skull: Normal Sinuses/Orbits: Right sphenoid sinusitis with mucoperiosteal thickening. Other sinuses are clear. Orbits are negative. Other: None IMPRESSION: Age related atrophy.  No evidence of acute or focal brain finding. Atherosclerotic calcification of the major vessels at the base of the brain. Right sphenoid sinusitis with chronic mucoperiosteal thickening. Electronically Signed   By: Nelson Chimes M.D.   On: 04/08/2018 15:53   Mr Brain Wo Contrast  Result Date: 04/09/2018 CLINICAL DATA:  TIA. LEFT arm numbness this morning, LEFT vision changes. History of hypertension hyperlipidemia. EXAM: MRI HEAD WITHOUT CONTRAST TECHNIQUE: Multiplanar, multiecho pulse sequences of the brain and surrounding structures were obtained without intravenous contrast. COMPARISON:  CT HEAD April 08, 2018. FINDINGS: INTRACRANIAL CONTENTS: No reduced diffusion to suggest acute ischemia. No susceptibility artifact to suggest hemorrhage. No parenchymal brain volume loss for age. No hydrocephalus. A few punctate supratentorial white matter FLAIR T2 hyperintensities are less  than expected for age. No suspicious parenchymal signal, masses, mass effect. No abnormal extra-axial fluid collections. No extra-axial masses. VASCULAR: Normal major intracranial vascular flow voids present at skull base. SKULL AND UPPER CERVICAL SPINE: No abnormal sellar expansion. No suspicious calvarial bone marrow signal. Craniocervical junction maintained. SINUSES/ORBITS: The mastoid air-cells and included paranasal sinuses are well-aerated.The included ocular globes and orbital contents are non-suspicious. Status post bilateral ocular lens implants. OTHER: 1 cm LEFT parotid cyst incompletely evaluated. IMPRESSION: Normal noncontrast MRI head for age. Electronically Signed   By: Elon Alas M.D.   On: 04/09/2018 00:50    Procedures Procedures (including critical care time)  Medications Ordered in ED Medications  sodium chloride flush (NS) 0.9 % injection 3 mL (has no administration in time range)  LORazepam (ATIVAN) tablet 1 mg (1 mg Oral Given 04/08/18 2334)     Initial Impression / Assessment and Plan / ED Course  I have reviewed the triage vital signs and the nursing notes.  Pertinent labs & imaging results that were available during my care of the patient were reviewed by me and considered in my medical decision making (see chart for details).    Patient is a 74-year-old male with history of CAD, hyperlipidemia, TIA, who presents the emergency department complaining of left facial and left upper extremity paresthesias that started this morning when he awoke.  Symptoms lasted for 3 hours and self resolved.  They wish the patient he was hemodynamically stable and nontoxic-appearing.  Physical exam as detailed above which is remarkable for generally well-appearing 85 year old male with no focal neurologic deficits.  Cardiovascular as well as pulmonary exam is benign.  Equal pulses in all 4 extremities.  Point-of-care glucose within normal limits.  EKG showing normal sinus rhythm  with no evidence of ST or T wave abnormalities concerning for acute ischemia.  Per my evaluation the patient he had lab work and CT head obtained via the first look pathway.  Labs and imaging were reviewed by myself and used in medical decision-making.  CBC with no leukocytosis and hemoglobin within normal limits.  Metabolic panel shows slight worsening of baseline renal function with creatinine bump from 1.18-1.35.  This does not likely reflect clinically significant change.  CT head reveals no acute focal brain findings.  Comments made on right sphenoid sinusitis with chronic mucoperiosteal thickening.  Patient denies any clinical symptoms of sinusitis at this time.  Given resolution of symptoms with significant past medical history, neurology was consulted and recommended MRI to further evaluate for possible CVA versus  TIA.  Neurology was able to review the patient's records which the patient presented for the exact same symptoms 1 year ago, but never followed up with neurology on an outpatient basis.  Patient is already on optimal lipid control medications as well as daily aspirin.  Joint decision making discussion was had with the patient who has a severe fear of having an MRI being enclosed in small spaces.  Patient states he will attempt to have the scan performed with PO anxiolytics.  MRI shows no acute abnormalities.   Results discussed with patient and wife who are reassured. I provided contact information for appropriate neurology follow up. I encouraged them to make appointment this time. I discussed concerning signs and symptoms that would necessitate return to the emergency department. Patient and wife voiced understanding and had no further questions at this time.       Final Clinical Impressions(s) / ED Diagnoses   Final diagnoses:  Paresthesia of arm    ED Discharge Orders    None       Tommie Raymond, MD 04/09/18 0111    Mesner, Corene Cornea, MD 04/09/18 1930

## 2018-04-08 NOTE — ED Triage Notes (Signed)
Patient reports waking up at 0600 with new L arm numbness this morning. He also states that he felt his L eye couldn't focus as well, but it has since resolved. Patient A&O x 4, no extremity weakness, facial droop, or focal symptoms noted at this time.

## 2018-04-08 NOTE — ED Notes (Signed)
Patient transported to MR. 

## 2018-04-08 NOTE — Patient Instructions (Addendum)
Medication Instructions:  Your physician has recommended you make the following change in your medication:   START: pravastatin 20mg  take one tablet every other day   If you need a refill on your cardiac medications before your next appointment, please call your pharmacy.   Lab work: NONE If you have labs (blood work) drawn today and your tests are completely normal, you will receive your results only by: [name] MyChart Message (if you have MyChart) OR . A paper copy in the mail If you have any lab test that is abnormal or we need to change your treatment, we will call you to review the results.  Testing/Procedures: You had an EKG   Follow-Up: You will follow up with Dr Bettina Gavia in 6 weeks.      Pravastatin tablets What is this medicine? PRAVASTATIN (PRA va stat in) is known as a HMG-CoA reductase inhibitor or 'statin'. It lowers the level of cholesterol and triglycerides in the blood. This drug may also reduce the risk of heart attack, stroke, or other health problems in patients with risk factors for heart disease. Diet and lifestyle changes are often used with this drug. This medicine may be used for other purposes; ask your health care provider or pharmacist if you have questions. COMMON BRAND NAME(S): Pravachol What should I tell my health care provider before I take this medicine? They need to know if you have any of these conditions: -diabetes -if you often drink alcohol -history of stroke -kidney disease -liver disease -muscle aches or weakness -thyroid disease -an unusual or allergic reaction to pravastatin, other medicines, foods, dyes, or preservatives -pregnant or trying to get pregnant -breast-feeding How should I use this medicine? Take pravastatin tablets by mouth. Swallow the tablets with a drink of water. Pravastatin can be taken at anytime of the day, with or without food. Follow the directions on the prescription label. Take your doses at regular intervals. Do  not take your medicine more often than directed. Talk to your pediatrician regarding the use of this medicine in children. Special care may be needed. Pravastatin has been used in children as young as 42 years of age. Overdosage: If you think you have taken too much of this medicine contact a poison control center or emergency room at once. NOTE: This medicine is only for you. Do not share this medicine with others. What if I miss a dose? If you miss a dose, take it as soon as you can. If it is almost time for your next dose, take only that dose. Do not take double or extra doses. What may interact with this medicine? This medicine may interact with the following medications: -colchicine -cyclosporine -other medicines for high cholesterol -some antibiotics like azithromycin, clarithromycin, erythromycin, and telithromycin This list may not describe all possible interactions. Give your health care provider a list of all the medicines, herbs, non-prescription drugs, or dietary supplements you use. Also tell them if you smoke, drink alcohol, or use illegal drugs. Some items may interact with your medicine. What should I watch for while using this medicine? Visit your doctor or health care professional for regular check-ups. You may need regular tests to make sure your liver is working properly. Your health care professional may tell you to stop taking this medicine if you develop muscle problems. If your muscle problems do not go away after stopping this medicine, contact your health care professional. Do not become pregnant while taking this medicine. Women should inform their health care professional if  they wish to become pregnant or think they might be pregnant. There is a potential for serious side effects to an unborn child. Talk to your health care professional or pharmacist for more information. Do not breast-feed an infant while taking this medicine. This medicine may affect blood sugar levels.  If you have diabetes, check with your doctor or health care professional before you change your diet or the dose of your diabetic medicine. If you are going to need surgery or other procedure, tell your doctor that you are using this medicine. This drug is only part of a total heart-health program. Your doctor or a dietician can suggest a low-cholesterol and low-fat diet to help. Avoid alcohol and smoking, and keep a proper exercise schedule. This medicine may cause a decrease in Co-Enzyme Q-10. You should make sure that you get enough Co-Enzyme Q-10 while you are taking this medicine. Discuss the foods you eat and the vitamins you take with your health care professional. What side effects may I notice from receiving this medicine? Side effects that you should report to your doctor or health care professional as soon as possible: -allergic reactions like skin rash, itching or hives, swelling of the face, lips, or tongue -dark urine -fever -muscle pain, cramps, or weakness -redness, blistering, peeling or loosening of the skin, including inside the mouth -trouble passing urine or change in the amount of urine -unusually weak or tired -yellowing of the eyes or skin Side effects that usually do not require medical attention (report to your doctor or health care professional if they continue or are bothersome): -gas -headache -heartburn -indigestion -stomach pain This list may not describe all possible side effects. Call your doctor for medical advice about side effects. You may report side effects to FDA at 1-800-FDA-1088. Where should I keep my medicine? Keep out of the reach of children. Store at room temperature between 15 to 30 degrees C (59 to 86 degrees F). Protect from light. Keep container tightly closed. Throw away any unused medicine after the expiration date. NOTE: This sheet is a summary. It may not cover all possible information. If you have questions about this medicine, talk to  your doctor, pharmacist, or health care provider.  2019 Elsevier/Gold Standard (2016-09-19 12:37:09)

## 2018-04-09 ENCOUNTER — Emergency Department (HOSPITAL_COMMUNITY): Payer: Medicare Other

## 2018-04-09 ENCOUNTER — Telehealth: Payer: Self-pay

## 2018-04-09 DIAGNOSIS — G459 Transient cerebral ischemic attack, unspecified: Secondary | ICD-10-CM | POA: Diagnosis not present

## 2018-04-09 DIAGNOSIS — R200 Anesthesia of skin: Secondary | ICD-10-CM | POA: Diagnosis not present

## 2018-04-09 NOTE — Telephone Encounter (Signed)
-----   Message from Pixie Casino, MD sent at 04/08/2018 11:47 AM EDT ----- Regarding: RE: Provider change Yes .. fine with me.  Dr. Lemmie Evens ----- Message ----- From: Stevan Born, CMA Sent: 04/08/2018  11:24 AM EDT To: Pixie Casino, MD, Richardo Priest, MD Subject: Provider change                                Patient was worked in to see Dr Bettina Gavia as he was the DOD.  Patient would like to continue seeing Dr Bettina Gavia as he lives in [place] and would like to cut out the drive.  Are you both OK with this?  Thank you, Elmyra Ricks, CMA

## 2018-04-09 NOTE — Discharge Instructions (Addendum)
Follow up with neurology for paresthesias. Please return to the ED if symptoms return before you're able to be seen by neurology.

## 2018-04-12 ENCOUNTER — Other Ambulatory Visit: Payer: Self-pay

## 2018-04-12 ENCOUNTER — Ambulatory Visit (INDEPENDENT_AMBULATORY_CARE_PROVIDER_SITE_OTHER): Payer: Medicare Other | Admitting: Neurology

## 2018-04-12 ENCOUNTER — Encounter: Payer: Self-pay | Admitting: Neurology

## 2018-04-12 VITALS — BP 124/66 | HR 72 | Temp 98.6°F | Ht 69.5 in | Wt 198.0 lb

## 2018-04-12 DIAGNOSIS — I2510 Atherosclerotic heart disease of native coronary artery without angina pectoris: Secondary | ICD-10-CM | POA: Diagnosis not present

## 2018-04-12 DIAGNOSIS — G459 Transient cerebral ischemic attack, unspecified: Secondary | ICD-10-CM

## 2018-04-12 MED ORDER — CLOPIDOGREL BISULFATE 75 MG PO TABS: 75.0000 mg | ORAL_TABLET | Freq: Every day | ORAL | 3 refills | Status: DC

## 2018-04-12 NOTE — Progress Notes (Signed)
Chad Byrd - Initial Visit   Date: 04/12/18  Chad Byrd MRN: [id number] DOB: July 05, 1928   Dear Dr. Betsey Holiday:  Thank you for your kind referral of Chad Byrd for consultation of left arm numbness. Although his history is well known to you, please allow [place] to reiterate it for the purpose of our medical record. The patient was accompanied to the clinic by friend, [name], who also provides collateral information.     History of Present Illness: Chad Byrd is a 85 y.o. right-handed Caucasian male with hypertension, hyperlipidemia, and CAD presenting for evaluation of left arm numbness/tingling.  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  He woke up on 3/9 and developed numbness over the entire left arm and left cheek.  There was no weakness or facial droop.  Tingling lasted about an hour, then resolved.  He had similar episode in December 2018.  He has not had symptoms recur.  He went to the ER on 3/9 where MRI brain was normal for age, no evidence of stroke.  He takes aspirin 81 mg daily.  He does not take statin therapy due to intolerance.  He was offered low-dose of pravastatin earlier this week, but stopped this due to side effects.  Unfortunately Repatha is cost prohibitive.  He takes Zetia 10 mg daily.  He drinks 1oz scotch several times per week.  Out-side paper records, electronic medical record, and images have been reviewed where available and summarized as:  MRI brain wo contrast 04/09/2018:  Normal noncontrast MRI head for age.  CTA head and neck 01/10/2017: 1. No emergent large vessel occlusion or high-grade stenosis. 2. Attenuated vascularity within the distal right MCA distribution.  No proximal or distal occlusion. Moderate narrowing of the distal M1 segment of the right MCA. 3. Bilateral carotid bifurcation atherosclerosis without hemodynamically significant stenosis. 4.  Aortic Atherosclerosis  (ICD10-I70.0).  Lab Results  Component Value Date   HGBA1C 5.7 (H) 01/11/2017     Past Medical History:  Diagnosis Date  . CAD (coronary artery disease)   . Cataract   . Hyperlipidemia   . Hypertension     Past Surgical History:  Procedure Laterality Date  . HERNIA REPAIR  1982  . RECTAL SURGERY  mid 1990s     Medications:  Outpatient Encounter Medications as of 04/12/2018  Medication Sig Byrd  . aspirin EC 81 MG tablet Take 81 mg by mouth daily.   . clopidogrel (PLAVIX) 75 MG tablet Take 1 tablet (75 mg total) by mouth daily.   [name] EPINEPHrine 0.3 mg/0.3 mL IJ SOAJ injection Inject 0.3 mg into the muscle as needed for anaphylaxis.    [name] ezetimibe (ZETIA) 10 MG tablet Take 1 tablet (10 mg total) by mouth daily.   . isosorbide mononitrate (IMDUR) 30 MG 24 hr tablet TAKE 1 TABLET BY MOUTH ONCE DAILY (Patient taking differently: Take 30 mg by mouth daily. )   . lisinopril (PRINIVIL,ZESTRIL) 5 MG tablet Take 1 tablet (5 mg total) by mouth daily.   . metoprolol succinate (TOPROL-XL) 25 MG 24 hr tablet TAKE 1 TABLET BY MOUTH ONCE DAILY (Patient taking differently: Take 25 mg by mouth daily. )   . Multiple Vitamins-Minerals (ONE-A-DAY MENS 50+ ADVANTAGE) TABS Take 1 tablet by mouth daily.    . nitroGLYCERIN (NITROSTAT) 0.4 MG SL tablet Place 0.4 mg under the tongue every 5 (five) minutes as needed for chest pain.   . [DISCONTINUED] pravastatin (PRAVACHOL) 20 MG  tablet Take 1 tablet (20 mg total) by mouth every other day. 04/08/2018: New med, has not started yet   No facility-administered encounter medications on file as of 04/12/2018.     Allergies:  Allergies  Allergen Reactions  . Atorvastatin Other (See Comments)    Myalgia and causes extreme drowsiness  . Simvastatin Other (See Comments)    Myalgia and extreme drowsiness   . Statins Other (See Comments)    Statins cause muscle aches and extreme drowsiness    Family History: Family History  Problem Relation Age of Onset   . Alzheimer's disease Brother   . Lung cancer Sister     Social History: Social History   Tobacco Use  . Smoking status: Never Smoker  . Smokeless tobacco: Never Used  Substance Use Topics  . Alcohol use: Yes    Comment: 1-2 oz of Scotch daily  . Drug use: No   Social History   Social History Narrative   epworth sleepiness scale = 10 (09/29/15)   exercise - walk dogs daily, work on farm      Patent is right-handed. He lives with his girlfriend of 29 years, [name].    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    Review of Systems:  CONSTITUTIONAL: No fevers, chills, night sweats, or weight loss.   EYES: No visual changes or eye pain ENT: No hearing changes.  No history of nose bleeds.   RESPIRATORY: No cough, wheezing and shortness of breath.   CARDIOVASCULAR: Negative for chest pain, and palpitations.   GI: Negative for abdominal discomfort, blood in stools or black stools.  No recent change in bowel habits.   GU:  No history of incontinence.   MUSCLOSKELETAL: No history of joint pain or swelling.  No myalgias.   SKIN: Negative for lesions, rash, and itching.   HEMATOLOGY/ONCOLOGY: Negative for prolonged bleeding, bruising easily, and swollen nodes.  No history of cancer.   ENDOCRINE: Negative for cold or heat intolerance, polydipsia or goiter.   PSYCH:  No depression or anxiety symptoms.   NEURO: As Above.   Vital Signs:  BP 124/66   Pulse 72   Temp 98.6 F (37 C)   Ht 5' 9.5" (1.765 m)   Wt 198 lb (89.8 kg)   SpO2 95%   BMI 28.82 kg/m    General Medical Exam:   General:  Well appearing, comfortable.   Eyes/ENT: see cranial nerve examination.   Neck:   No carotid bruits. Respiratory:  Clear to auscultation, good air entry bilaterally.   Cardiac:  Regular rate and rhythm, no murmur.   Extremities:  No deformities, edema, or skin discoloration.  Skin:  No rashes or lesions.  Neurological Exam: MENTAL STATUS including orientation to time, place, person,  recent and remote memory, attention span and concentration, language, and fund of knowledge is normal.  Speech is not dysarthric.  CRANIAL NERVES: II:  No visual field defects.  Unremarkable fundi.   III-IV-VI: Pupils equal round and reactive to light.  Normal conjugate, extra-ocular eye movements in all directions of gaze.  No nystagmus.  No ptosis.   V:  Normal facial sensation.    VII:  Normal facial symmetry and movements.   VIII:  Normal hearing and vestibular function.   IX-X:  Normal palatal movement.   XI:  Normal shoulder shrug and head rotation.   XII:  Normal tongue strength and range of motion, no deviation or fasciculation.  MOTOR:  No atrophy, fasciculations or abnormal movements.  No pronator drift.   Upper Extremity:  Right  Left  Deltoid  5/5   5/5   Biceps  5/5   5/5   Triceps  5/5   5/5   Infraspinatus 5/5  5/5  Medial pectoralis 5/5  5/5  Wrist extensors  5/5   5/5   Wrist flexors  5/5   5/5   Finger extensors  5/5   5/5   Finger flexors  5/5   5/5   Dorsal interossei  5/5   5/5   Abductor pollicis  5/5   5/5   Tone (Ashworth scale)  0  0   Lower Extremity:  Right  Left  Hip flexors  5/5   5/5   Hip extensors  5/5   5/5   Adductor 5/5  5/5  Abductor 5/5  5/5  Knee flexors  5/5   5/5   Knee extensors  5/5   5/5   Dorsiflexors  5/5   5/5   Plantarflexors  5/5   5/5   Toe extensors  5/5   5/5   Toe flexors  5/5   5/5   Tone (Ashworth scale)  0  0   MSRs:  Right        Left                  brachioradialis 2+  2+  biceps 2+  2+  triceps 2+  2+  patellar 2+  2+  ankle jerk 2+  2+  Hoffman no  no  plantar response down  down   SENSORY:  Normal and symmetric perception of light touch, pinprick, vibration, and proprioception.   COORDINATION/GAIT: Normal finger-to- nose-finger.  Intact rapid alternating movements bilaterally. Gait narrow based and stable.   IMPRESSION: Transient ischemic attack manifesting with left arm > face numbness on 04/08/2018.   He has not had these symptoms recur or new neurological symptoms.  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  With focal sensory symptoms, purely lacunar syndrome is also considered, however his blood pressure is well controlled and he does not have diabetes. LDL is elevated at 114. I will check for vascular disease with MRA head without gadolinium and ultrasound carotids Start dual antiplatelet therapy with aspirin 81 mg and add Plavix 75 mg daily. Continue Zetia 10 mg daily.  Hhe is intolerant of statin therapy  Stroke warning signs were discussed  Further recommendations pending results.  Thank you for allowing me to participate in patient's care.  If I can answer any additional questions, I would be pleased to do so.    Sincerely,    Nahzir Pohle K. Posey Pronto, DO

## 2018-04-12 NOTE — Patient Instructions (Addendum)
We will check US carotids and MRA head and call you with the results  Start plavix 75mg  daily + continue aspirin 81mg  daily  We have sent a referral to [facility] for your MRA and carotid ultrasound and they will call you directly to schedule your appt. They are located at [place]. If you need to contact them directly please call [phone].

## 2018-04-26 ENCOUNTER — Telehealth: Payer: Self-pay | Admitting: *Deleted

## 2018-04-26 DIAGNOSIS — C61 Malignant neoplasm of prostate: Secondary | ICD-10-CM | POA: Diagnosis not present

## 2018-04-26 DIAGNOSIS — R310 Gross hematuria: Secondary | ICD-10-CM | POA: Diagnosis not present

## 2018-04-26 DIAGNOSIS — N312 Flaccid neuropathic bladder, not elsewhere classified: Secondary | ICD-10-CM | POA: Diagnosis not present

## 2018-04-26 MED ORDER — PITAVASTATIN CALCIUM 2 MG PO TABS: 2.0000 mg | ORAL_TABLET | Freq: Every day | ORAL | 3 refills | Status: DC

## 2018-04-26 NOTE — Telephone Encounter (Signed)
Spoke with [name] per DPR and advised for patient to stop pravastatin all together and wait two weeks before starting livalo 2 mg daily to allow symptoms to resolve. [name] is agreeable and verbalized understanding. Livalo prescription has been sent to [facility] in [place] as requested. Medication list updated. No further questions.

## 2018-04-26 NOTE — Telephone Encounter (Signed)
It is very uncommon to have itching or rash with pravastatin but it could happen let us wait 2 weeks and have him take Livalo 2 mg daily dispense 90 days refill 3 1 daily

## 2018-04-26 NOTE — Telephone Encounter (Signed)
Pt has been itching and red since started the Pravastatin. Pt has stopped this med and wants to know how you would like him to proceed. Please advise

## 2018-04-26 NOTE — Telephone Encounter (Signed)
Please advise. Thanks.  

## 2018-05-13 ENCOUNTER — Telehealth: Payer: Self-pay | Admitting: Cardiology

## 2018-05-13 NOTE — Telephone Encounter (Signed)
Pt NEEDS Consent for Virtual visit on 420/20. Unable to leave message due to no voicemail set up. LBW 4/13

## 2018-05-14 ENCOUNTER — Telehealth: Payer: Self-pay | Admitting: Neurology

## 2018-05-14 NOTE — Telephone Encounter (Signed)
Partner called regarding his MRA and and she said that he has been injected twice in the past Month. She wanted to make sure it was safe. She was also asking about if "he would go back into that box"? Please Call. Thanks

## 2018-05-15 NOTE — Telephone Encounter (Signed)
I called her back and informed her that this is done without dye.  She said that if he has to be strapped down then he can't do it.  Gave her the # to [facility] to find out about that.  She will call me back and let me know.

## 2018-05-16 ENCOUNTER — Telehealth: Payer: Self-pay | Admitting: *Deleted

## 2018-05-16 ENCOUNTER — Telehealth: Payer: Self-pay | Admitting: Cardiology

## 2018-05-16 ENCOUNTER — Other Ambulatory Visit: Payer: Self-pay | Admitting: *Deleted

## 2018-05-16 DIAGNOSIS — G459 Transient cerebral ischemic attack, unspecified: Secondary | ICD-10-CM

## 2018-05-16 MED ORDER — DIAZEPAM 5 MG PO TABS: ORAL_TABLET | ORAL | 0 refills | Status: DC

## 2018-05-16 NOTE — Telephone Encounter (Signed)
Prescription sent

## 2018-05-16 NOTE — Telephone Encounter (Signed)
Cardiac Questionnaire:    Since your last visit or hospitalization:    1. Have you been having new or worsening chest pain? no   2. Have you been having new or worsening shortness of breath? no 3. Have you been having new or worsening leg swelling, wt gain, or increase in abdominal girth (pants fitting more tightly)? no   4. Have you had any passing out spells? no    *A YES to any of these questions would result in the appointment being kept. *If all the answers to these questions are NO, we should indicate that given the current situation regarding the worldwide coronarvirus pandemic, at the recommendation of the CDC, we are looking to limit gatherings in our waiting area, and thus will reschedule their appointment beyond four weeks from today.   _____________   [id number] Pre-Screening Questions:   Do you currently have a fever? no  Have you recently travelled on a cruise, internationally, or to [place], [place], [place], [place], or [place]) ? no  Have you been in contact with someone that is currently pending confirmation of Covid19 testing or has been confirmed to have the [place] virus?  no  Are you currently experiencing fatigue or cough? No  YOUR CARDIOLOGY TEAM HAS ARRANGED FOR AN E-VISIT FOR YOUR APPOINTMENT - PLEASE REVIEW IMPORTANT INFORMATION BELOW SEVERAL DAYS PRIOR TO YOUR APPOINTMENT  Due to the recent COVID-19 pandemic, we are transitioning in-person office visits to tele-medicine visits in an effort to decrease unnecessary exposure to our patients, their families, and staff. Medicare and most insurances are covering these visits without a copay needed. We also encourage you to sign up for MyChart if you have not already done so. You will need a smartphone if possible. For patients that do not have this, we can still complete the visit using a regular telephone but do prefer a smartphone to enable video when possible. You may have a family member that lives with you  that can help. If possible, we also ask that you have a blood pressure cuff and scale at home to measure your blood pressure, heart rate and weight prior to your scheduled appointment. Patients with clinical needs that need an in-person evaluation and testing will still be able to come to the office if absolutely necessary. If you have any questions, feel free to call our office.   CONSENT FOR TELE-HEALTH VISIT - PLEASE REVIEW  I hereby voluntarily request, consent and authorize [facility] and its employed or contracted physicians, physician assistants, nurse practitioners or other licensed health care professionals (the Practitioner), to provide me with telemedicine health care services (the Services") as deemed necessary by the treating Practitioner. I acknowledge and consent to receive the Services by the Practitioner via telemedicine. I understand that the telemedicine visit will involve communicating with the Practitioner through live audiovisual communication technology and the disclosure of certain medical information by electronic transmission. I acknowledge that I have been given the opportunity to request an in-person assessment or other available alternative prior to the telemedicine visit and am voluntarily participating in the telemedicine visit.  I understand that I have the right to withhold or withdraw my consent to the use of telemedicine in the course of my care at any time, without affecting my right to future care or treatment, and that the Practitioner or I may terminate the telemedicine visit at any time. I understand that I have the right to inspect all information obtained and/or recorded in the  course of the telemedicine visit and may receive copies of available information for a reasonable fee.  I understand that some of the potential risks of receiving the Services via telemedicine include:   Delay or interruption in medical evaluation due to technological equipment failure  or disruption;  Information transmitted may not be sufficient (e.g. poor resolution of images) to allow for appropriate medical decision making by the Practitioner; and/or   In rare instances, security protocols could fail, causing a breach of personal health information.  Furthermore, I acknowledge that it is my responsibility to provide information about my medical history, conditions and care that is complete and accurate to the best of my ability. I acknowledge that Practitioner's advice, recommendations, and/or decision may be based on factors not within their control, such as incomplete or inaccurate data provided by me or distortions of diagnostic images or specimens that may result from electronic transmissions. I understand that the practice of medicine is not an exact science and that Practitioner makes no warranties or guarantees regarding treatment outcomes. I acknowledge that I will receive a copy of this consent concurrently upon execution via email to the email address I last provided but may also request a printed copy by calling the office of [facility].    I understand that my insurance will be billed for this visit.   I have read or had this consent read to me.  I understand the contents of this consent, which adequately explains the benefits and risks of the Services being provided via telemedicine.   I have been provided ample opportunity to ask questions regarding this consent and the Services and have had my questions answered to my satisfaction.  I give my informed consent for the services to be provided through the use of telemedicine in my medical care  By participating in this telemedicine visit I agree to the above.  Patient gives consent for televisit pp

## 2018-05-16 NOTE — Telephone Encounter (Signed)
Patient is scheduled for MRA head and neck on May 1.  Will you please sent Rx for valium?  Thanks!

## 2018-05-20 ENCOUNTER — Encounter: Payer: Self-pay | Admitting: Cardiology

## 2018-05-20 ENCOUNTER — Telehealth: Payer: Self-pay | Admitting: Cardiology

## 2018-05-20 ENCOUNTER — Other Ambulatory Visit: Payer: Medicare Other

## 2018-05-20 ENCOUNTER — Telehealth (INDEPENDENT_AMBULATORY_CARE_PROVIDER_SITE_OTHER): Payer: Medicare Other | Admitting: Cardiology

## 2018-05-20 ENCOUNTER — Other Ambulatory Visit: Payer: Self-pay

## 2018-05-20 VITALS — BP 107/59 | HR 71 | Ht 69.5 in | Wt 198.0 lb

## 2018-05-20 DIAGNOSIS — G459 Transient cerebral ischemic attack, unspecified: Secondary | ICD-10-CM

## 2018-05-20 DIAGNOSIS — E785 Hyperlipidemia, unspecified: Secondary | ICD-10-CM

## 2018-05-20 DIAGNOSIS — I2510 Atherosclerotic heart disease of native coronary artery without angina pectoris: Secondary | ICD-10-CM | POA: Diagnosis not present

## 2018-05-20 DIAGNOSIS — I10 Essential (primary) hypertension: Secondary | ICD-10-CM

## 2018-05-20 MED ORDER — BEMPEDOIC ACID 180 MG PO TABS: 1.0000 | ORAL_TABLET | Freq: Every day | ORAL | 3 refills | Status: DC

## 2018-05-20 NOTE — Telephone Encounter (Signed)
Virtual Visit Pre-Appointment Phone Call  Steps For Call:  1. Confirm consent - "In the setting of the current Covid19 crisis, you are scheduled for a (phone or video) visit with your provider on (date) at (time).  Just as we do with many in-office visits, in order for you to participate in this visit, we must obtain consent.  If you'd like, I can send this to your mychart (if signed up) or email for you to review.  Otherwise, I can obtain your verbal consent now.  All virtual visits are billed to your insurance company just like a normal visit would be.  By agreeing to a virtual visit, we'd like you to understand that the technology does not allow for your provider to perform an examination, and thus may limit your provider's ability to fully assess your condition. If your provider identifies any concerns that need to be evaluated in person, we will make arrangements to do so.  Finally, though the technology is pretty good, we cannot assure that it will always work on either your or our end, and in the setting of a video visit, we may have to convert it to a phone-only visit.  In either situation, we cannot ensure that we have a secure connection.  Are you willing to proceed?" STAFF: Did the patient verbally acknowledge consent to telehealth visit? Document YES/NO here: YES 2.   3. Confirm the BEST phone number to call the day of the visit by including in appointment notes  4. Give patient instructions for WebEx/MyChart download to smartphone as below or Doximity/Doxy.me if video visit (depending on what platform provider is using)  5. Advise patient to be prepared with their blood pressure, heart rate, weight, any heart rhythm information, their current medicines, and a piece of paper and pen handy for any instructions they may receive the day of their visit  6. Inform patient they will receive a phone call 15 minutes prior to their appointment time (may be from unknown caller ID) so they should  be prepared to answer  7. Confirm that appointment type is correct in Epic appointment notes (VIDEO vs PHONE)     TELEPHONE CALL NOTE  Chad Byrd has been deemed a candidate for a follow-up tele-health visit to limit community exposure during the Covid-19 pandemic. I spoke with the patient via phone to ensure availability of phone/video source, confirm preferred email & phone number, and discuss instructions and expectations.  I reminded Chad Byrd to be prepared with any vital sign and/or heart rhythm information that could potentially be obtained via home monitoring, at the time of his visit. I reminded Chad Byrd to expect a phone call at the time of his visit if his visit.  Chad Byrd 05/20/2018 11:01 AM   INSTRUCTIONS FOR DOWNLOADING THE WEBEX APP TO SMARTPHONE  - If Apple, ask patient to go to App Store and type in WebEx in the search bar. [employer], the blue/green circle. If Android, go to [employer] and type in [employer] in the search bar. The app is free but as with any other app downloads, their phone may require them to verify saved payment information or Apple/Android password.  - The patient does NOT have to create an account. - On the day of the visit, the assist will walk the patient through joining the meeting with the meeting number/password.  INSTRUCTIONS FOR DOWNLOADING THE MYCHART APP TO SMARTPHONE  - The patient must first make sure  to have activated MyChart and know their login information - If Apple, go to [employer] and type in MyChart in the search bar and download the app. If Android, ask patient to go to [employer] and type in [place] in the search bar and download the app. The app is free but as with any other app downloads, their phone may require them to verify saved payment information or Apple/Android password.  - The patient will need to then log into the app with their MyChart username and password, and select  [place] as their healthcare provider to link the account. When it is time for your visit, go to the MyChart app, find appointments, and click Begin Video Visit. Be sure to Select Allow for your device to access the Microphone and Camera for your visit. You will then be connected, and your provider will be with you shortly.  **If they have any issues connecting, or need assistance please contact MyChart service desk [phone])**  **If using a computer, in order to ensure the best quality for their visit they will need to use either of the following Internet Browsers: [employer], or Google Chrome**  IF USING DOXIMITY or DOXY.ME - The patient will receive a link just prior to their visit, either by text or email (to be determined day of appointment depending on if it's doxy.me or Doximity).     FULL LENGTH CONSENT FOR TELE-HEALTH VISIT   I hereby voluntarily request, consent and authorize [facility] and its employed or contracted physicians, physician assistants, nurse practitioners or other licensed health care professionals (the Practitioner), to provide me with telemedicine health care services (the Services") as deemed necessary by the treating Practitioner. I acknowledge and consent to receive the Services by the Practitioner via telemedicine. I understand that the telemedicine visit will involve communicating with the Practitioner through live audiovisual communication technology and the disclosure of certain medical information by electronic transmission. I acknowledge that I have been given the opportunity to request an in-person assessment or other available alternative prior to the telemedicine visit and am voluntarily participating in the telemedicine visit.  I understand that I have the right to withhold or withdraw my consent to the use of telemedicine in the course of my care at any time, without affecting my right to future care or treatment, and that the  Practitioner or I may terminate the telemedicine visit at any time. I understand that I have the right to inspect all information obtained and/or recorded in the course of the telemedicine visit and may receive copies of available information for a reasonable fee.  I understand that some of the potential risks of receiving the Services via telemedicine include:   Delay or interruption in medical evaluation due to technological equipment failure or disruption;  Information transmitted may not be sufficient (e.g. poor resolution of images) to allow for appropriate medical decision making by the Practitioner; and/or   In rare instances, security protocols could fail, causing a breach of personal health information.  Furthermore, I acknowledge that it is my responsibility to provide information about my medical history, conditions and care that is complete and accurate to the best of my ability. I acknowledge that Practitioner's advice, recommendations, and/or decision may be based on factors not within their control, such as incomplete or inaccurate data provided by me or distortions of diagnostic images or specimens that may result from electronic transmissions. I understand that the practice of medicine is not an exact science  and that Practitioner makes no warranties or guarantees regarding treatment outcomes. I acknowledge that I will receive a copy of this consent concurrently upon execution via email to the email address I last provided but may also request a printed copy by calling the office of [facility].    I understand that my insurance will be billed for this visit.   I have read or had this consent read to me.  I understand the contents of this consent, which adequately explains the benefits and risks of the Services being provided via telemedicine.   I have been provided ample opportunity to ask questions regarding this consent and the Services and have had my questions answered to my  satisfaction.  I give my informed consent for the services to be provided through the use of telemedicine in my medical care  By participating in this telemedicine visit I agree to the above.

## 2018-05-20 NOTE — Patient Instructions (Addendum)
Medication Instructions:  Your physician has recommended you make the following change in your medication:   START Bempedoic Acid (Nexletol) 180 mg: Take 1 tablet daily   If you need a refill on your cardiac medications before your next appointment, please call your pharmacy.   Lab work: None  If you have labs (blood work) drawn today and your tests are completely normal, you will receive your results only by: [name] MyChart Message (if you have MyChart) OR . A paper copy in the mail If you have any lab test that is abnormal or we need to change your treatment, we will call you to review the results.  Testing/Procedures: None  Follow-Up: At [facility], you and your health needs are our priority.  As part of our continuing mission to provide you with exceptional heart care, we have created designated Provider Care Teams.  These Care Teams include your primary Cardiologist (physician) and Advanced Practice Providers (APPs -  Physician Assistants and Nurse Practitioners) who all work together to provide you with the care you need, when you need it. You will need a follow up telephone appointment in 3 months: Friday, 08/09/2018, at 11:00 am.

## 2018-05-20 NOTE — Progress Notes (Signed)
Virtual Visit via Telephone Note   [place] though [place] a [place] visit.  This visit type was conducted due to national recommendations for restrictions regarding the COVID-19 Pandemic (e.g. social distancing) in an effort to limit this patient's exposure and mitigate transmission in our community.  Due to his co-morbid illnesses, this patient is at least at moderate risk for complications without adequate follow up.  This format is felt to be most appropriate for this patient at this time.  The patient did not have access to video technology/had technical difficulties with video requiring transitioning to audio format only (telephone).  All issues noted in this document were discussed and addressed.  No physical exam could be performed with this format.  Please refer to the patient's chart for his  consent to telehealth for [facility].   Evaluation Performed:  Follow-up visit  Date:  05/20/2018   ID:  Chad Byrd, DOB April 05, 1928, MRN [id number]  Patient Location: Home Provider Location: Other:  [place]  PCP:  Darryll Capers, NP  Cardiologist:  Dr Bettina Gavia Electrophysiologist:  None   Chief Complaint: Follow-up after recent TIA  History of Present Illness:    Chad Byrd is a 85 y.o. 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 Debara Pickett 05/25/2017.  He underwent coronary angiography December 2015 showing severe proximal left circumflex stenosis felt to be  high risk for intervention and was treated medically.  He underwent a an echocardiogram 10/15/2017 showing an ejection fraction of 50-55 percent there is no segmental LV dysfunction and  mild to moderate aortic regurgitation.  He was last seen by me 04/08/2018 when he presented with a focal TIA and was referred  to [facility] ED for stroke evaluation.  He did not sustain a stroke and subsequently followed up with neurology on 04/11/2088.  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  He was to have outpatient evaluation with MRA but is on hold with COVID-19.  He is advised PCSK9 therapy but declined due to cost.  The patient does not have symptoms concerning for COVID-19 infection (fever, chills, cough, or new shortness of breath).   He has been seen by neurology and is scheduled for MRA 05/31/2018 [facility] imaging.  He has had no recurrent TIA and unfortunately could not tolerate pravastatin with diffuse rash and itching.  He still has a severely elevated LDL on Zetia cannot afford Livalo and will refer him to the lipid clinic to see if the Pharm.D. can help [place] with access to PCSK9 or whether the clear study is still open for enrollment.  He has had no angina shortness of breath palpitation or syncope. Past Medical History:  Diagnosis Date  . CAD (coronary artery disease)   . Cataract   . Hyperlipidemia   . Hypertension    Past Surgical History:  Procedure Laterality Date  . HERNIA REPAIR  1982  . RECTAL SURGERY  mid 1990s     Current Meds  Medication Sig  . aspirin EC 81 MG tablet Take 81 mg by mouth daily.  . clopidogrel (PLAVIX) 75 MG tablet Take 1 tablet (75 mg total) by mouth daily.  . diazepam (VALIUM) 5 MG tablet Take 1 tablet 30-min to MRI.  Do not drive while on  this medication.  [name] EPINEPHrine 0.3 mg/0.3 mL IJ SOAJ injection Inject 0.3 mg into the muscle as needed for anaphylaxis.   [name] isosorbide mononitrate (IMDUR) 30 MG 24 hr tablet TAKE 1 TABLET BY MOUTH ONCE DAILY  . lisinopril (PRINIVIL,ZESTRIL) 5 MG tablet Take 1 tablet (5 mg total) by mouth daily.  . metoprolol succinate (TOPROL-XL) 25 MG 24 hr tablet TAKE 1 TABLET BY MOUTH ONCE DAILY  . Multiple Vitamins-Minerals (ONE-A-DAY MENS 50+ ADVANTAGE) TABS Take 1 tablet by mouth daily.   .  nitroGLYCERIN (NITROSTAT) 0.4 MG SL tablet Place 0.4 mg under the tongue every 5 (five) minutes as needed for chest pain.     Allergies:   Atorvastatin; Pravastatin; Simvastatin; and Statins   Social History   Tobacco Use  . Smoking status: Never Smoker  . Smokeless tobacco: Never Used  Substance Use Topics  . Alcohol use: Yes    Comment: 1-2 oz of Scotch daily  . Drug use: No     Family Hx: The patient's family history includes Alzheimer's disease in his brother; Lung cancer in his sister.  ROS:   Please see the history of present illness.     All other systems reviewed and are negative.   Prior CV studies:   The following studies were reviewed today:    Labs/Other Tests and Data Reviewed:    EKG:  An ECG dated 04/12/18 was personally reviewed today and demonstrated:  sinus rhythm left axis deviation left anterior hemiblock 1 PVC  Recent Labs: 04/08/2018: ALT 24; BUN 23; Creatinine, Ser 1.35; Hemoglobin 15.6; Platelets 178; Potassium 4.1; Sodium 139   Recent Lipid Panel Lab Results  Component Value Date/Time   CHOL 182 02/20/2018 08:59 AM   CHOL 203 (H) 09/29/2015 10:39 AM   TRIG 137 02/20/2018 08:59 AM   TRIG 127 09/29/2015 10:39 AM   HDL 41 02/20/2018 08:59 AM   HDL 36 (L) 09/29/2015 10:39 AM   CHOLHDL 4.4 02/20/2018 08:59 AM   CHOLHDL 4.0 01/11/2017 04:44 AM   LDLCALC 114 (H) 02/20/2018 08:59 AM   LDLCALC 142 (H) 09/29/2015 10:39 AM    Wt Readings from Last 3 Encounters:  05/20/18 198 lb (89.8 kg)  04/12/18 198 lb (89.8 kg)  04/08/18 198 lb (89.8 kg)     Objective:    Vital Signs:  BP (!) 107/59 (BP Location: Left Arm, Patient Position: Sitting)   Pulse 71   Ht 5' 9.5" (1.765 m)   Wt 198 lb (89.8 kg)   BMI 28.82 kg/m    VITAL SIGNS:  reviewed He is alert oriented mood and affect normal thought and cognition are normal no audible wheezing and was in no distress ASSESSMENT & PLAN:    1. 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  He will continue current treatment including chronic dual antiplatelet therapy and antihypertensive lisinopril.  Home blood pressures are consistently in range and he will take in the evenings for enhanced effect 2. Coronary artery disease stable having no angina and present medical treatment New [place] Heart Association class I continue dual antiplatelet lipid-lowering and oral nitrate. 3. Hypertension stable at target continue ACE inhibitor Hyperlipidemia unfortunately not at target high risk situation refer to pharmacy lipid clinic to try to achieve access to medication either through the clear study or  PCSK9 COVID-19 Education: The signs and symptoms of COVID-19 were discussed with the patient and how to seek care for testing (follow up with PCP or arrange E-visit).  The importance of social distancing was discussed today.  Time:   Today, I have spent 27 minutes with the patient with telehealth technology discussing the above problems.     Medication Adjustments/Labs and Tests Ordered: Current medicines are reviewed at length with the patient today.  Concerns regarding medicines are outlined above.   Tests Ordered: No orders of the defined types were placed in this encounter.   Medication Changes: Meds ordered this encounter  Medications  . Bempedoic Acid (NEXLETOL) 180 MG TABS    Sig: Take 1 tablet by mouth daily.    Dispense:  30 tablet    Refill:  3    Disposition:  Follow up in 3 month(s)  Signed, Shirlee More, MD  05/20/2018 11:58 AM    [facility]

## 2018-05-20 NOTE — Addendum Note (Signed)
Addended by: Austin Miles on: 05/20/2018 01:36 PM   Modules accepted: Orders

## 2018-05-23 ENCOUNTER — Emergency Department (HOSPITAL_COMMUNITY)
Admission: EM | Admit: 2018-05-23 | Discharge: 2018-05-23 | Disposition: A | Discharge status: Home / Self Care | Payer: Medicare Other | Attending: Emergency Medicine | Admitting: Emergency Medicine

## 2018-05-23 ENCOUNTER — Other Ambulatory Visit: Payer: Self-pay

## 2018-05-23 ENCOUNTER — Encounter (HOSPITAL_COMMUNITY): Payer: Self-pay | Admitting: Emergency Medicine

## 2018-05-23 ENCOUNTER — Emergency Department (HOSPITAL_COMMUNITY): Payer: Medicare Other

## 2018-05-23 ENCOUNTER — Telehealth: Payer: Self-pay | Admitting: Neurology

## 2018-05-23 ENCOUNTER — Telehealth: Payer: Self-pay | Admitting: *Deleted

## 2018-05-23 DIAGNOSIS — Z7982 Long term (current) use of aspirin: Secondary | ICD-10-CM | POA: Diagnosis not present

## 2018-05-23 DIAGNOSIS — G459 Transient cerebral ischemic attack, unspecified: Secondary | ICD-10-CM | POA: Diagnosis not present

## 2018-05-23 DIAGNOSIS — R200 Anesthesia of skin: Secondary | ICD-10-CM | POA: Insufficient documentation

## 2018-05-23 DIAGNOSIS — I2510 Atherosclerotic heart disease of native coronary artery without angina pectoris: Secondary | ICD-10-CM | POA: Insufficient documentation

## 2018-05-23 DIAGNOSIS — I10 Essential (primary) hypertension: Secondary | ICD-10-CM | POA: Insufficient documentation

## 2018-05-23 DIAGNOSIS — I6501 Occlusion and stenosis of right vertebral artery: Secondary | ICD-10-CM | POA: Diagnosis not present

## 2018-05-23 DIAGNOSIS — R51 Headache: Secondary | ICD-10-CM | POA: Diagnosis present

## 2018-05-23 DIAGNOSIS — R202 Paresthesia of skin: Secondary | ICD-10-CM | POA: Diagnosis not present

## 2018-05-23 DIAGNOSIS — Z79899 Other long term (current) drug therapy: Secondary | ICD-10-CM | POA: Diagnosis not present

## 2018-05-23 LAB — BASIC METABOLIC PANEL
Anion gap: 10 (ref 5–15)
BUN: 17 mg/dL (ref 8–23)
CO2: 23 mmol/L (ref 22–32)
Calcium: 9.3 mg/dL (ref 8.9–10.3)
Chloride: 107 mmol/L (ref 98–111)
Creatinine, Ser: 1.12 mg/dL (ref 0.61–1.24)
GFR calc Af Amer: >60 mL/min (ref >60)
GFR calc non Af Amer: 59 mL/min — ABNORMAL LOW (ref >60)
Glucose, Bld: 96 mg/dL (ref 70–99)
Potassium: 4.2 mmol/L (ref 3.5–5.1)
Sodium: 140 mmol/L (ref 135–145)

## 2018-05-23 LAB — CBC WITH DIFFERENTIAL/PLATELET
Abs Immature Granulocytes: 0.02 10*3/uL (ref 0.00–0.07)
Basophils Absolute: 0 10*3/uL (ref 0.0–0.1)
Basophils Relative: 1 %
Eosinophils Absolute: 0.1 10*3/uL (ref 0.0–0.5)
Eosinophils Relative: 1 %
HCT: 53 % — ABNORMAL HIGH (ref 39.0–52.0)
Hemoglobin: 17.2 g/dL — ABNORMAL HIGH (ref 13.0–17.0)
Immature Granulocytes: 0 %
Lymphocytes Relative: 23 %
Lymphs Abs: 1.2 10*3/uL (ref 0.7–4.0)
MCH: 29.8 pg (ref 26.0–34.0)
MCHC: 32.5 g/dL (ref 30.0–36.0)
MCV: 91.7 fL (ref 80.0–100.0)
Monocytes Absolute: 0.6 10*3/uL (ref 0.1–1.0)
Monocytes Relative: 11 %
Neutro Abs: 3.6 10*3/uL (ref 1.7–7.7)
Neutrophils Relative %: 64 %
Platelets: 171 10*3/uL (ref 150–400)
RBC: 5.78 MIL/uL (ref 4.22–5.81)
RDW: 13.2 % (ref 11.5–15.5)
WBC: 5.5 10*3/uL (ref 4.0–10.5)
nRBC: 0 % (ref 0.0–0.2)

## 2018-05-23 LAB — COMPREHENSIVE METABOLIC PANEL
ALT: 15 U/L (ref 0–44)
AST: 36 U/L (ref 15–41)
Albumin: 3.5 g/dL (ref 3.5–5.0)
Alkaline Phosphatase: 59 U/L (ref 38–126)
Anion gap: 9 (ref 5–15)
BUN: 19 mg/dL (ref 8–23)
CO2: 25 mmol/L (ref 22–32)
Calcium: 9.2 mg/dL (ref 8.9–10.3)
Chloride: 105 mmol/L (ref 98–111)
Creatinine, Ser: 1.17 mg/dL (ref 0.61–1.24)
GFR calc Af Amer: >60 mL/min (ref >60)
GFR calc non Af Amer: 56 mL/min — ABNORMAL LOW (ref >60)
Glucose, Bld: 103 mg/dL — ABNORMAL HIGH (ref 70–99)
Potassium: 5.9 mmol/L — ABNORMAL HIGH (ref 3.5–5.1)
Sodium: 139 mmol/L (ref 135–145)
Total Bilirubin: 1.5 mg/dL — ABNORMAL HIGH (ref 0.3–1.2)
Total Protein: 6.3 g/dL — ABNORMAL LOW (ref 6.5–8.1)

## 2018-05-23 LAB — URINALYSIS, ROUTINE W REFLEX MICROSCOPIC
Bilirubin Urine: NEGATIVE
Glucose, UA: NEGATIVE mg/dL
Ketones, ur: NEGATIVE mg/dL
Nitrite: NEGATIVE
Protein, ur: NEGATIVE mg/dL
Specific Gravity, Urine: 1.009 (ref 1.005–1.030)
pH: 5 (ref 5.0–8.0)

## 2018-05-23 LAB — APTT: aPTT: 29 seconds (ref 24–36)

## 2018-05-23 LAB — I-STAT CREATININE, ED: Creatinine, Ser: 1.1 mg/dL (ref 0.61–1.24)

## 2018-05-23 LAB — PROTIME-INR
INR: 1.1 (ref 0.8–1.2)
Prothrombin Time: 14.5 seconds (ref 11.4–15.2)

## 2018-05-23 LAB — ETHANOL: Alcohol, Ethyl (B): <10 mg/dL (ref <10)

## 2018-05-23 MED ORDER — LORAZEPAM 2 MG/ML IJ SOLN
0.5000 mg | Freq: Once | INTRAMUSCULAR | Status: AC
  Administered 2018-05-23: 0.5 mg via INTRAVENOUS
  Filled 2018-05-23 (×2): qty 1

## 2018-05-23 MED ORDER — GADOBUTROL 1 MMOL/ML IV SOLN
10.0000 mL | Freq: Once | INTRAVENOUS | Status: AC | PRN
  Administered 2018-05-23: 10 mL via INTRAVENOUS

## 2018-05-23 NOTE — ED Notes (Signed)
Patient verbalizes understanding of discharge instructions. Opportunity for questioning and answers were provided. Armband removed by staff, pt discharged from ED.  

## 2018-05-23 NOTE — ED Triage Notes (Signed)
[place], Wife, please call with questions or updates

## 2018-05-23 NOTE — ED Notes (Signed)
Patient transported to MRI 

## 2018-05-23 NOTE — Telephone Encounter (Signed)
Patient's friend advised and they are going to [facility].

## 2018-05-23 NOTE — Telephone Encounter (Signed)
Please instruct him to go to the ER or call 911 for expedited evaluation for stroke/TIA.

## 2018-05-23 NOTE — Discharge Instructions (Signed)
Please see the information and instructions below regarding your visit.  Your diagnoses today include:  1. TIA (transient ischemic attack)   2. Paresthesia     Tests performed today include: See side panel of your discharge paperwork for testing performed today. Vital signs are listed at the bottom of these instructions.   Medications prescribed:    Take any prescribed medications only as prescribed, and any over the counter medications only as directed on the packaging.  Please continue all your home medications.  Please do not change them at this time.  You will continue aspirin and Plavix as prescribed.  Home care instructions:  Please follow any educational materials contained in this packet.   Follow-up instructions: Please follow-up with Dr. Posey Pronto as soon as possible.   Return instructions:  Please return to the Emergency Department if you experience worsening symptoms.  Please return to the emergency department if you experience any similar symptoms, experience any weakness or numbness in face, arms, legs, difficulty speaking, difficulty walking, or feeling lightheaded or dizzy. Please return if you have any other emergent concerns.  Additional Information:   Your vital signs today were: BP (!) 143/100    Pulse 76    Temp 98.2 F (36.8 C) (Oral)    Resp 16    Ht 5' 9.5" (1.765 m)    Wt 89 kg    SpO2 95%    BMI 28.56 kg/m  If your blood pressure (BP) was elevated on multiple readings during this visit above 130 for the top number or above 80 for the bottom number, please have this repeated by your primary care provider within one month. --------------  Thank you for allowing [place] to participate in your care today.

## 2018-05-23 NOTE — ED Provider Notes (Signed)
[facility] Provider Note   CSN: [id number] Arrival date & time: 05/23/18  1113    History   Chief Complaint Chief Complaint  Patient presents with   Numbness   Headache    HPI Chad Byrd is a 85 y.o. male.      HPI  Patient is an 85-year male with past medical history of CAD, TIA, hypertension, hyperlipidemia presenting for numbness of the left face and left arm.  Patient reports that he woke up with his symptoms and his last known well was 11:30 PM on 05-22-2018.  Patient ports when he woke up he had some numbness to the left lateral lip as well as the majority of the left arm.  He denies any numbness on the hand.  He reports that he still has residual symptoms in the corner of the left side of his lip as well as the bicep of the left arm and a patch of the left forearm.  He denies any weakness in upper or lower extremities.  He denied any facial weakness, dysarthria, dysphagia, gait disturbance, visual disturbance, neck pain, chest pain, or shortness of breath.  Patient reports he had a mild frontal headache that was aching in a bandlike pattern during transport from the home to the emergency department, however it is gone and was not present when he woke up.  Patient reports that he has had these symptoms during at least 2 previous discrete episodes.  He had a work-up for TIA in 2018 that revealed right-sided M1 stenosis.  He is on dual antiplatelet therapy with aspirin and Plavix, however he has been unable to tolerate neither statin or Zetia.  Past Medical History:  Diagnosis Date   CAD (coronary artery disease)    Cataract    Hyperlipidemia    Hypertension     Patient Active Problem List   Diagnosis Date Noted   Left sided numbness 01/10/2017   TIA (transient ischemic attack) 01/10/2017   HTN (hypertension) 01/10/2017   Statin intolerance 03/07/2016   Coronary artery disease involving native coronary artery of native  heart without angina pectoris 09/29/2015   Thoracic aortic aneurysm without rupture ([place]) 09/29/2015   Hyperlipidemia 09/29/2015    Past Surgical History:  Procedure Laterality Date   HERNIA REPAIR  1982   RECTAL SURGERY  mid 1990s        Home Medications    Prior to Admission medications   Medication Sig Start Date End Date Taking? Authorizing Provider  aspirin EC 81 MG tablet Take 81 mg by mouth daily.    [provider]  Bempedoic Acid (NEXLETOL) 180 MG TABS Take 1 tablet by mouth daily. 05/20/18   Richardo Priest, MD  clopidogrel (PLAVIX) 75 MG tablet Take 1 tablet (75 mg total) by mouth daily. 04/12/18   Patel, Arvin Collard K, DO  diazepam (VALIUM) 5 MG tablet Take 1 tablet 30-min to MRI.  Do not drive while on this medication. 05/16/18   Narda Amber K, DO  EPINEPHrine 0.3 mg/0.3 mL IJ SOAJ injection Inject 0.3 mg into the muscle as needed for anaphylaxis.  09/25/17   [provider]  isosorbide mononitrate (IMDUR) 30 MG 24 hr tablet TAKE 1 TABLET BY MOUTH ONCE DAILY 12/03/17   Hilty, Nadean Corwin, MD  lisinopril (PRINIVIL,ZESTRIL) 5 MG tablet Take 1 tablet (5 mg total) by mouth daily. 02/12/18   Hilty, Nadean Corwin, MD  metoprolol succinate (TOPROL-XL) 25 MG 24 hr tablet TAKE 1 TABLET BY MOUTH  ONCE DAILY 12/03/17   Hilty, Nadean Corwin, MD  Multiple Vitamins-Minerals (ONE-A-DAY MENS 50+ ADVANTAGE) TABS Take 1 tablet by mouth daily.     [provider]  nitroGLYCERIN (NITROSTAT) 0.4 MG SL tablet Place 0.4 mg under the tongue every 5 (five) minutes as needed for chest pain.    [provider]    Family History Family History  Problem Relation Age of Onset   Alzheimer's disease Brother    Lung cancer Sister     Social History Social History   Tobacco Use   Smoking status: Never Smoker   Smokeless tobacco: Never Used  Substance Use Topics   Alcohol use: Yes    Comment: 1-2 oz of Scotch daily   Drug use: No     Allergies   Atorvastatin;  Pravastatin; Simvastatin; and Statins   Review of Systems Review of Systems  Constitutional: Negative for chills and fever.  HENT: Negative for congestion and rhinorrhea.   Eyes: Negative for visual disturbance.  Respiratory: Negative for cough, chest tightness and shortness of breath.   Cardiovascular: Negative for chest pain, palpitations and leg swelling.  Gastrointestinal: Negative for abdominal pain, nausea and vomiting.  Genitourinary: Negative for dysuria and flank pain.  Musculoskeletal: Negative for back pain and myalgias.  Skin: Negative for rash.  Neurological: Positive for numbness. Negative for dizziness, seizures, syncope, speech difficulty, weakness, light-headedness and headaches.     Physical Exam Updated Vital Signs BP (!) 167/98 (BP Location: Right Arm)    Pulse 71    Temp 98.2 F (36.8 C) (Oral)    Resp 15    Ht 5' 9.5" (1.765 m)    Wt 89 kg    SpO2 98%    BMI 28.56 kg/m   Physical Exam Vitals signs and nursing note reviewed.  Constitutional:      General: He is not in acute distress.    Appearance: He is well-developed.  HENT:     Head: Normocephalic and atraumatic.  Eyes:     Conjunctiva/sclera: Conjunctivae normal.     Pupils: Pupils are equal, round, and reactive to light.  Neck:     Musculoskeletal: Normal range of motion and neck supple.  Cardiovascular:     Rate and Rhythm: Normal rate and regular rhythm.     Heart sounds: S1 normal and S2 normal. No murmur.  Pulmonary:     Effort: Pulmonary effort is normal.     Breath sounds: Normal breath sounds. No wheezing or rales.  Abdominal:     General: There is no distension.     Palpations: Abdomen is soft.     Tenderness: There is no abdominal tenderness. There is no guarding.  Musculoskeletal: Normal range of motion.        General: No deformity.  Lymphadenopathy:     Cervical: No cervical adenopathy.  Skin:    General: Skin is warm and dry.     Findings: No erythema or rash.  Neurological:       Mental Status: He is alert.     GCS: GCS eye subscore is 4. GCS verbal subscore is 5. GCS motor subscore is 6.     Comments: Mental Status:  Alert, oriented, thought content appropriate, able to give a coherent history. Speech fluent without evidence of aphasia. Able to follow 2 step commands without difficulty.  Cranial Nerves:  II:  Peripheral visual fields grossly normal, pupils equal, round, reactive to light III,IV, VI: ptosis not present, extra-ocular motions intact bilaterally  V,VII:  smile symmetric, facial light touch sensation equal VIII: hearing grossly normal to voice  X: uvula elevates symmetrically  XI: bilateral shoulder shrug symmetric and strong XII: midline tongue extension without fassiculations Motor:  Normal tone. 5/5 in upper and lower extremities bilaterally including strong and equal grip strength and dorsiflexion/plantar flexion Sensory: Light touch in distal upper and lower extremities normal. Patient reporting decreased sensation in the left bicep as well as the left lateral arm as well as the V3 distribution of left facial nerve.  Deep Tendon Reflexes: 2+ and symmetric in the biceps and patella.  Cerebellar: normal finger-to-nose with bilateral upper extremities Gait: normal gait and balance Stance: No pronator drift and good coordination, strength, and position sense with tapping of bilateral arms (performed in sitting position). CV: distal pulses palpable throughout   Psychiatric:        Behavior: Behavior normal.        Thought Content: Thought content normal.        Judgment: Judgment normal.     Large Artery Stroke Screening     Weakness:  Patient shows no weakness.    Vision:  NONE  Aphasia:  NONE  Neglect:  NONE:  VAN =  Negative  If patient has any weakness PLUS any one of the below: Visual Disturbance (field cut, double, or blind vision) Aphasia (inability to speak or understand) Neglect (gaze to one side or ignoring one side) This  is likely a large artery clot (cortical symptoms) = VAN Positive  ED Treatments / Results  Labs (all labs ordered are listed, but only abnormal results are displayed) Labs Reviewed  URINALYSIS, ROUTINE W REFLEX MICROSCOPIC - Abnormal; Notable for the following components:      Result Value   Color, Urine STRAW (*)    Hgb urine dipstick MODERATE (*)    Leukocytes,Ua TRACE (*)    Bacteria, UA RARE (*)    All other components within normal limits  CBC WITH DIFFERENTIAL/PLATELET - Abnormal; Notable for the following components:   Hemoglobin 17.2 (*)    HCT 53.0 (*)    All other components within normal limits  COMPREHENSIVE METABOLIC PANEL - Abnormal; Notable for the following components:   Potassium 5.9 (*)    Glucose, Bld 103 (*)    Total Protein 6.3 (*)    Total Bilirubin 1.5 (*)    GFR calc non Af Amer 56 (*)    All other components within normal limits  BASIC METABOLIC PANEL - Abnormal; Notable for the following components:   GFR calc non Af Amer 59 (*)    All other components within normal limits  ETHANOL  PROTIME-INR  APTT  DIFFERENTIAL  I-STAT CREATININE, ED    EKG EKG Interpretation  Date/Time:  Thursday May 23 2018 11:34:25 EDT Ventricular Rate:  71 PR Interval:    QRS Duration: 100 QT Interval:  439 QTC Calculation: 478 R Axis:   -56 Text Interpretation:  Sinus rhythm Left anterior fascicular block Abnormal R-wave progression, early transition Borderline T abnormalities, anterior leads Borderline prolonged QT interval No significant change since last tracing Confirmed by Virgel Manifold [phone]) on 05/23/2018 4:08:24 PM   Radiology Ct Head Wo Contrast  Result Date: 05/23/2018 CLINICAL DATA:  Facial and upper extremity numbness EXAM: CT HEAD WITHOUT CONTRAST TECHNIQUE: Contiguous axial images were obtained from the base of the skull through the vertex without intravenous contrast. COMPARISON:  Head CT April 08, 2018 and brain MRI April 09, 2018 FINDINGS: Brain:  There is stable mild  diffuse atrophy. There is no intracranial mass, hemorrhage, extra-axial fluid collection, or midline shift. The brain parenchyma appears unremarkable. There is no evident acute infarct. Vascular: There is no appreciable hyperdense vessel. There is calcification in each carotid siphon region. Skull: Bony calvarium appears intact. Sinuses/Orbits: There is mucosal thickening in multiple ethmoid air cells. There is opacification throughout much of the right sphenoid sinus. Orbits appear symmetric bilaterally. Patient has had cataract surgery bilaterally. Other: Visualized mastoid air cells are clear. There is debris in the right external auditory canal. IMPRESSION: Mild atrophy, probably age-related, stable. Brain parenchyma appears unremarkable. No acute infarct. No mass or hemorrhage. There are foci of arterial vascular calcification. There are foci of paranasal sinus disease, most notably in the right sphenoid sinus. There is probable cerumen in the right external auditory canal. Electronically Signed   By: Lowella Grip III M.D.   On: 05/23/2018 13:05   Mr Jodene Nam Head Wo Contrast  Result Date: 05/23/2018 CLINICAL DATA:  Numbness and tingling in the left face and left arm. Similar transient symptoms in the past. EXAM: MR HEAD WITHOUT CONTRAST MR CIRCLE OF WILLIS WITHOUT CONTRAST MRA OF THE NECK WITHOUT AND WITH CONTRAST TECHNIQUE: Multiplanar, multiecho pulse sequences of the brain, circle of willis and surrounding structures were obtained without intravenous contrast. Angiographic images of the neck were obtained using MRA technique without and with intravenous contrast. CONTRAST:  10 mL Gadavist COMPARISON:  Head CT 05/23/2018 and MRI 04/09/2018. Head and neck CTA 01/10/2017. FINDINGS: MR HEAD FINDINGS Brain: There is no evidence of acute infarct, intracranial hemorrhage, mass, midline shift, or extra-axial fluid collection. The ventricles and sulci are within normal limits for age.  Several scattered punctate foci of T2 hyperintensity in the cerebral white matter are unchanged from the prior MRI and also within normal limits for age. Vascular: Major intracranial vascular flow voids are preserved. Skull and upper cervical spine: Unremarkable bone marrow signal. Sinuses/Orbits: Bilateral cataract extraction. Chronic right sphenoid sinusitis. Clear mastoid air cells. Other: Unchanged 10 mm T2 hyperintense lesion in the left parotid gland. MR CIRCLE OF WILLIS FINDINGS The study is mildly motion degraded. The visualized distal vertebral arteries are patent to the basilar. Grossly patent PICA and SCA origins are seen bilaterally. The basilar artery is patent with apparent mild mid basilar stenosis felt to be artifactual given normal appearance on the contrast-enhanced neck MRA and on the prior CTA. There is a fetal origin of the right PCA. Both PCAs are patent without evidence of significant proximal stenosis. The internal carotid arteries are patent from skull base to carotid termini with mild siphon irregularity but no significant stenosis. ACAs and MCAs are patent without significant A1 or M1 stenosis. There is asymmetric irregularity of right MCA branch vessels within unchanged mild-to-moderate proximal right M2 stenosis noted. No aneurysm is identified. MRA NECK FINDINGS The study is mildly motion degraded. There is a standard 3 vessel aortic arch with widely patent brachiocephalic and subclavian arteries. The carotid arteries are patent without evidence of significant stenosis or dissection. The mid left cervical ICA is tortuous. The vertebral arteries are patent and codominant with antegrade flow bilaterally. There is likely mild right vertebral artery origin stenosis with otherwise unremarkable appearance of the vertebral arteries bilaterally. IMPRESSION: 1. Unremarkable appearance of the brain for age. No acute intracranial abnormality. 2. No evidence of major intracranial arterial occlusion  or flow limiting proximal stenosis. 3. Unchanged asymmetric right MCA branch vessel irregularity including mild-to-moderate proximal right M2 stenosis. 4. Widely patent cervical carotid  arteries. 5. Patent vertebral arteries with mild proximal stenosis on the right. Electronically Signed   By: Logan Bores M.D.   On: 05/23/2018 15:40   Mr Jodene Nam Neck W Wo Contrast  Result Date: 05/23/2018 CLINICAL DATA:  Numbness and tingling in the left face and left arm. Similar transient symptoms in the past. EXAM: MR HEAD WITHOUT CONTRAST MR CIRCLE OF WILLIS WITHOUT CONTRAST MRA OF THE NECK WITHOUT AND WITH CONTRAST TECHNIQUE: Multiplanar, multiecho pulse sequences of the brain, circle of willis and surrounding structures were obtained without intravenous contrast. Angiographic images of the neck were obtained using MRA technique without and with intravenous contrast. CONTRAST:  10 mL Gadavist COMPARISON:  Head CT 05/23/2018 and MRI 04/09/2018. Head and neck CTA 01/10/2017. FINDINGS: MR HEAD FINDINGS Brain: There is no evidence of acute infarct, intracranial hemorrhage, mass, midline shift, or extra-axial fluid collection. The ventricles and sulci are within normal limits for age. Several scattered punctate foci of T2 hyperintensity in the cerebral white matter are unchanged from the prior MRI and also within normal limits for age. Vascular: Major intracranial vascular flow voids are preserved. Skull and upper cervical spine: Unremarkable bone marrow signal. Sinuses/Orbits: Bilateral cataract extraction. Chronic right sphenoid sinusitis. Clear mastoid air cells. Other: Unchanged 10 mm T2 hyperintense lesion in the left parotid gland. MR CIRCLE OF WILLIS FINDINGS The study is mildly motion degraded. The visualized distal vertebral arteries are patent to the basilar. Grossly patent PICA and SCA origins are seen bilaterally. The basilar artery is patent with apparent mild mid basilar stenosis felt to be artifactual given normal  appearance on the contrast-enhanced neck MRA and on the prior CTA. There is a fetal origin of the right PCA. Both PCAs are patent without evidence of significant proximal stenosis. The internal carotid arteries are patent from skull base to carotid termini with mild siphon irregularity but no significant stenosis. ACAs and MCAs are patent without significant A1 or M1 stenosis. There is asymmetric irregularity of right MCA branch vessels within unchanged mild-to-moderate proximal right M2 stenosis noted. No aneurysm is identified. MRA NECK FINDINGS The study is mildly motion degraded. There is a standard 3 vessel aortic arch with widely patent brachiocephalic and subclavian arteries. The carotid arteries are patent without evidence of significant stenosis or dissection. The mid left cervical ICA is tortuous. The vertebral arteries are patent and codominant with antegrade flow bilaterally. There is likely mild right vertebral artery origin stenosis with otherwise unremarkable appearance of the vertebral arteries bilaterally. IMPRESSION: 1. Unremarkable appearance of the brain for age. No acute intracranial abnormality. 2. No evidence of major intracranial arterial occlusion or flow limiting proximal stenosis. 3. Unchanged asymmetric right MCA branch vessel irregularity including mild-to-moderate proximal right M2 stenosis. 4. Widely patent cervical carotid arteries. 5. Patent vertebral arteries with mild proximal stenosis on the right. Electronically Signed   By: Logan Bores M.D.   On: 05/23/2018 15:40   Mr Brain Wo Contrast  Result Date: 05/23/2018 CLINICAL DATA:  Numbness and tingling in the left face and left arm. Similar transient symptoms in the past. EXAM: MR HEAD WITHOUT CONTRAST MR CIRCLE OF WILLIS WITHOUT CONTRAST MRA OF THE NECK WITHOUT AND WITH CONTRAST TECHNIQUE: Multiplanar, multiecho pulse sequences of the brain, circle of willis and surrounding structures were obtained without intravenous contrast.  Angiographic images of the neck were obtained using MRA technique without and with intravenous contrast. CONTRAST:  10 mL Gadavist COMPARISON:  Head CT 05/23/2018 and MRI 04/09/2018. Head and neck CTA 01/10/2017. FINDINGS:  MR HEAD FINDINGS Brain: There is no evidence of acute infarct, intracranial hemorrhage, mass, midline shift, or extra-axial fluid collection. The ventricles and sulci are within normal limits for age. Several scattered punctate foci of T2 hyperintensity in the cerebral white matter are unchanged from the prior MRI and also within normal limits for age. Vascular: Major intracranial vascular flow voids are preserved. Skull and upper cervical spine: Unremarkable bone marrow signal. Sinuses/Orbits: Bilateral cataract extraction. Chronic right sphenoid sinusitis. Clear mastoid air cells. Other: Unchanged 10 mm T2 hyperintense lesion in the left parotid gland. MR CIRCLE OF WILLIS FINDINGS The study is mildly motion degraded. The visualized distal vertebral arteries are patent to the basilar. Grossly patent PICA and SCA origins are seen bilaterally. The basilar artery is patent with apparent mild mid basilar stenosis felt to be artifactual given normal appearance on the contrast-enhanced neck MRA and on the prior CTA. There is a fetal origin of the right PCA. Both PCAs are patent without evidence of significant proximal stenosis. The internal carotid arteries are patent from skull base to carotid termini with mild siphon irregularity but no significant stenosis. ACAs and MCAs are patent without significant A1 or M1 stenosis. There is asymmetric irregularity of right MCA branch vessels within unchanged mild-to-moderate proximal right M2 stenosis noted. No aneurysm is identified. MRA NECK FINDINGS The study is mildly motion degraded. There is a standard 3 vessel aortic arch with widely patent brachiocephalic and subclavian arteries. The carotid arteries are patent without evidence of significant stenosis  or dissection. The mid left cervical ICA is tortuous. The vertebral arteries are patent and codominant with antegrade flow bilaterally. There is likely mild right vertebral artery origin stenosis with otherwise unremarkable appearance of the vertebral arteries bilaterally. IMPRESSION: 1. Unremarkable appearance of the brain for age. No acute intracranial abnormality. 2. No evidence of major intracranial arterial occlusion or flow limiting proximal stenosis. 3. Unchanged asymmetric right MCA branch vessel irregularity including mild-to-moderate proximal right M2 stenosis. 4. Widely patent cervical carotid arteries. 5. Patent vertebral arteries with mild proximal stenosis on the right. Electronically Signed   By: Logan Bores M.D.   On: 05/23/2018 15:40    Procedures Procedures (including critical care time)  Medications Ordered in ED Medications - No data to display   Initial Impression / Assessment and Plan / ED Course  I have reviewed the triage vital signs and the nursing notes.  Pertinent labs & imaging results that were available during my care of the patient were reviewed by me and considered in my medical decision making (see chart for details).  Clinical Course as of May 22 2005  Thu May 23, 2018  1207 Permissive HTN in setting of possible stroke.   BP(!): 167/98 [AM]  1210 Symptoms resolved. Spoke with Dr. Rory Percy of neurology who recommends MRI brain without contrast, MR MRA head without contrast and MR MRA neck with contrast.  Will rule out that patient has had a stroke.  Appreciate his involvement.   [AM]  30 Pt self caths and has a history of prostate cancer.   Urinalysis, Routine w reflex microscopic(!) [AM]  1603 Will repeat. Likely 2/2 hemolysis.   Potassium(!): 5.9 [AM]  1640 Patient symptom free. Spoke with Dr. Rory Percy of neurology who states that patient can follow-up as outpatient neurologist regarding his findings.  There are no acute findings on MRI or CT today.  He is on  dual antiplatelet therapy already, and he should continue this with no changes.  Appreciate Dr. Johny Chess  involvement.   [AM]  1838 Normal K. Previous reading from hemolysis.   Potassium: 4.2 [AM]    Clinical Course User Index [AM] Albesa Seen, PA-C       This is an 66-year male with past medical history of TIA, hypertension, prostate cancer presenting for symptoms consistent with his prior TIA.  He still is experiencing some symptoms on arrival, however last known well was 12 hours ago.  Patient nontoxic-appearing and neurologically intact with exception of his reported paresthesias over the left lateral arm and left lateral lip.  CT head returned with no evidence of intracranial bleeding.  We will proceed with MRI per discussion with neurology.  Work-up demonstrating elevated hemoglobin at 7.2.  Patient has had a slightly elevated hemoglobin in the past before.  Electrolytes unremarkable after repeating K due to hemolysis.  EKG normal sinus rhythm.  This does show poor R wave progression as well as left anterior fascicular block, but no changes.  Neuroimaging work-up shows no acute changes since 2018.  Patient has unchanged asymmetric right MCA branch vessel irregularity including mild to moderate proximal right M2 stenosis.    Patient symptoms completely resolve during emergency department course.  Suspect TIA versus peripheral neuropathy. Dr. Rory Percy of neurology recommends continue medical management as an outpatient and neurology follow up.  Patient is already on DAPT.  He has not previously tolerated statins or Zetia.  Patient was instructed to the return to the emergency department for any further symptoms, facial weakness, extremity weakness/numbness, dysarthria, dysphagia, vertigo, or any new or worsening symptoms.  This is a shared visit with Dr. Quintella Reichert. Patient was independently evaluated by this attending physician. Attending physician consulted in evaluation and discharge  management.  Final Clinical Impressions(s) / ED Diagnoses   Final diagnoses:  TIA (transient ischemic attack)  Paresthesia    ED Discharge Orders    None       Tamala Julian 05/23/18 2020    Quintella Reichert, MD 05/24/18 1026

## 2018-05-23 NOTE — ED Notes (Signed)
Gave pt Ginger-Ale and sandwich. Tolerating well at this time.

## 2018-05-23 NOTE — Telephone Encounter (Signed)
Note has already been sent back to Dr. Posey Pronto.

## 2018-05-23 NOTE — Telephone Encounter (Signed)
Patient's significant other called and said that he woke up with numbness on the left side of his face and numbness and tingling in his left arm.  His BP has been 140/70, 170/70 and 142/60.  He has had this before and it sometimes it goes away in about an hour but has a hx of TIA.  Please advise.

## 2018-05-23 NOTE — Telephone Encounter (Signed)
Patient lmom regarding having a mini Stroke and would like to speak with someone. Please Call. Thanks

## 2018-05-23 NOTE — ED Triage Notes (Signed)
Pt arrives from home. Pt complains of arm numbness and facial numbness since 0630 this morning. Pt states this has occurred two times prior but in past the symptoms have gone away.

## 2018-05-23 NOTE — ED Notes (Signed)
Spoke with patients wife Otho Perl and updated on patient status, plan of care. Currently, pt in MRI waiting results and disposition. Explained to family more information would be available in the interim and someone from the care team will be in contact.

## 2018-05-30 DIAGNOSIS — C61 Malignant neoplasm of prostate: Secondary | ICD-10-CM | POA: Diagnosis not present

## 2018-05-31 ENCOUNTER — Telehealth (INDEPENDENT_AMBULATORY_CARE_PROVIDER_SITE_OTHER): Payer: Medicare Other | Admitting: Neurology

## 2018-05-31 ENCOUNTER — Other Ambulatory Visit: Payer: Medicare Other

## 2018-05-31 ENCOUNTER — Encounter: Payer: Self-pay | Admitting: Neurology

## 2018-05-31 ENCOUNTER — Encounter: Payer: Self-pay | Admitting: *Deleted

## 2018-05-31 ENCOUNTER — Other Ambulatory Visit: Payer: Self-pay

## 2018-05-31 VITALS — Ht 69.5 in | Wt 198.0 lb

## 2018-05-31 DIAGNOSIS — G459 Transient cerebral ischemic attack, unspecified: Secondary | ICD-10-CM

## 2018-05-31 NOTE — Progress Notes (Signed)
    Virtual Visit via Telephone Note The purpose of this virtual visit is to provide medical care while limiting exposure to the novel coronavirus.    Consent was obtained for phone visit:  Yes.   Answered questions that patient had about telehealth interaction:  Yes.   I discussed the limitations, risks, security and privacy concerns of performing an evaluation and management service by telephone. I also discussed with the patient that there may be a patient responsible charge related to this service. The patient expressed understanding and agreed to proceed.  Pt location: Home Physician Location: office Name of referring provider:  Darryll Capers, NP I connected with .Chad Byrd at patients initiation/request on 05/31/2018 at  9:30 AM EDT by telephone and verified that I am speaking with the correct person using two identifiers.  Pt MRN:  [id number] Pt DOB:  [birth year]   History of Present Illness:  This is an 85 year-old man with history of TIA and knowns right M1 stenosis returning for evaluation of episodic left face and arm numbness/tingling, lasting 5-6 hours.  He had another spell on 4/23 and was evaluated for TIA/stroke in the ER.  MRI brain did not show acute stroke. Vessel imaging continued to show asymmetric right MCA irregularity with mild-moderate proximal M2 stenosis.  He remains compliant with aspirin and plavix, but has noticed easy bruising/bleeding.  He cannot tolerate statin or zetia, and was recently prescribed bempedoic acid by his PCP, PA is in process.  He has not had any further spells left facial or arm numbness.  No weakness, confusion, or speech changes.  His blood pressure is 137/63 today and yesterday it was 123/60  Assessment and Plan:   TIA manifesting with episodic left face and arm paresthesias.  He has known right M1 stenosis which he can certainly be symptomatic from.  Fortunately, his most recent MRI/A did not show acute stroke and no progression of  stenosis.  I will manage him with medical therapy with dual antiplatelet therapy for at least 3 months, continue aspirin 81mg  and plavix.  Hopefully, he can be approved for bempedoic acid for hyperlipidemia.  Continue BP management with goal of SBP 110-140s. If he continues to have these spells, he may need cerebral angiogram to better evaluate intracranial vasculature. 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  Unlikely sensory seizure as duration of symptoms is too long.   Stroke warning signs again discussed.    Follow Up Instructions:   I discussed the assessment and treatment plan with the patient. The patient was provided an opportunity to ask questions and all were answered. The patient agreed with the plan and demonstrated an understanding of the instructions.   The patient was advised to call back or seek an in-person evaluation if the symptoms worsen or if the condition fails to improve as anticipated.  Return to clinic in 2 months  Total Time spent in visit with the patient was:  25 min, of which 100% of the time was spent in counseling and/or coordinating care.   Pt understands and agrees with the plan of care outlined.     [name], DO

## 2018-06-05 ENCOUNTER — Ambulatory Visit: Payer: Medicare Other | Admitting: Neurology

## 2018-06-05 ENCOUNTER — Telehealth: Payer: Self-pay | Admitting: Cardiology

## 2018-06-05 DIAGNOSIS — C61 Malignant neoplasm of prostate: Secondary | ICD-10-CM | POA: Diagnosis not present

## 2018-06-05 DIAGNOSIS — N401 Enlarged prostate with lower urinary tract symptoms: Secondary | ICD-10-CM | POA: Diagnosis not present

## 2018-06-05 DIAGNOSIS — R338 Other retention of urine: Secondary | ICD-10-CM | POA: Diagnosis not present

## 2018-06-05 DIAGNOSIS — N312 Flaccid neuropathic bladder, not elsewhere classified: Secondary | ICD-10-CM | POA: Diagnosis not present

## 2018-06-05 NOTE — Telephone Encounter (Signed)
Spoke with Luanne, Khadim's friend, per DPR. States with Medicare coverage, prescription would be more than $300. Gave information on bempedoic co-pay program with website [employer] and [phone]. She verbalizes understanding and will follow up with these resources.

## 2018-06-05 NOTE — Telephone Encounter (Signed)
Returning patient's call regarding Bempedoic prescription being too expensive for him, no answer, left voice message on home phone with callback number.

## 2018-06-05 NOTE — Telephone Encounter (Signed)
Please call patient regarding his Bempedoic. This medication is very expensive and they need to know if he can get help thru patient assistance or copay cards, etc. Please advise.

## 2018-06-10 ENCOUNTER — Other Ambulatory Visit: Payer: Self-pay | Admitting: Cardiology

## 2018-06-11 NOTE — Telephone Encounter (Signed)
Patient's friend (listed on DPR) Louanne calling back, states she has pursued other less expensive methods of obtaining Bempedoic prescription:Good Rx, Bempedoic/NEXLETOL and patient's insurance for special programs. Requesting samples from our office or information on other methods of getting medication at an affordable price.     Told her I will do some more research and get back in touch with her. She verbalizes understanding.

## 2018-06-12 NOTE — Telephone Encounter (Signed)
This is a unusual situation.  This is not urgent.  Best to check with Dr. Bettina Gavia on Monday when he is back.

## 2018-06-12 NOTE — Telephone Encounter (Signed)
Patient has been prescribed bempedoic per Dr. Bettina Gavia. Patient cannot afford the copay. Patient's friend, Weyman Croon has exhausted all options for assistance such as Good Rx, Bempedoic/NEXLETOL co-pay program and we do not carry samples of this medication in the office.      Pls advise if another medication can be ordered.     tx!

## 2018-06-12 NOTE — Telephone Encounter (Signed)
Patient's partner LouAnne phoned to inform us that patient's insurance says our office can request a prior authorization for bempedoic and the number for [place] to call to do so is  [phone]. Will follow-up on this.

## 2018-06-19 NOTE — Telephone Encounter (Signed)
Emailed Tana Coast to request assistance in submitting letter of appeal to [employer] part [employer] at [employer], [place] [phone]. Patient's partner Chad Byrd called and informed of above and that we will reach out to her as soon as we get a response from [employer]. She verbalizes understanding and has no further questions.

## 2018-06-20 NOTE — Telephone Encounter (Signed)
Phoned [employer] requesting reason for Nexletol denial. Will be faxed to [employer].

## 2018-06-27 NOTE — Telephone Encounter (Signed)
Information on prior authorization denial obtained and sent to Tana Coast, Clinical Pharmacy Director who states she will help with next step for getting approval for  [facility] prescription

## 2018-07-03 NOTE — Telephone Encounter (Signed)
Georgina Peer emailed the letter of appeals to me. Letter was printed and signed by Dr. Bettina Gavia. Successfully faxed to [employer] at [phone].

## 2018-07-04 NOTE — Telephone Encounter (Signed)
Evette with the appeals dept. Called to let us know Nexletol has been approved. They will also send a letter of confirmation.

## 2018-07-05 ENCOUNTER — Telehealth: Payer: Self-pay | Admitting: *Deleted

## 2018-07-05 MED ORDER — BEMPEDOIC ACID 180 MG PO TABS: 1.0000 | ORAL_TABLET | Freq: Every day | ORAL | 3 refills | Status: DC

## 2018-07-05 NOTE — Telephone Encounter (Signed)
error 

## 2018-07-05 NOTE — Telephone Encounter (Signed)
Patient's friend Arts administrator (DPR contact) phoned to inform us she received letter of approval for Roberta prescription from appeal, requesting medication to be sent to [facility] in [place]. Rx sent per request.

## 2018-07-05 NOTE — Addendum Note (Signed)
Addended by: Polly Cobia A on: 07/05/2018 10:40 AM   Modules accepted: Orders

## 2018-07-15 ENCOUNTER — Telehealth: Payer: Self-pay | Admitting: Cardiology

## 2018-07-15 NOTE — Telephone Encounter (Signed)
Please call, she needs some clarification.

## 2018-07-17 NOTE — Telephone Encounter (Addendum)
Spoke with [employer], (on [place]), asking for [place] to apply for tier exception to get lower co-pay for Nexletol. Submitted tier authorization exception request to [employer], decision should be received in 4-5 days.

## 2018-07-18 NOTE — Telephone Encounter (Signed)
Left message for Chad Byrd per DPR advising that the tier exception was denied for nexletol. Advised her to contact our office with any further questions or concerns.

## 2018-07-18 NOTE — Telephone Encounter (Signed)
Left voice message for Chad Byrd informing that tier exception request was submitted to [employer] yesterday and is being processed, to expect response  in 4-5 days

## 2018-08-08 ENCOUNTER — Other Ambulatory Visit: Payer: Self-pay | Admitting: Internal Medicine

## 2018-08-09 ENCOUNTER — Other Ambulatory Visit: Payer: Self-pay

## 2018-08-09 ENCOUNTER — Ambulatory Visit (INDEPENDENT_AMBULATORY_CARE_PROVIDER_SITE_OTHER): Payer: Medicare Other | Admitting: Cardiology

## 2018-08-09 VITALS — BP 128/78 | HR 70 | Temp 98.2°F | Ht 69.5 in | Wt 192.2 lb

## 2018-08-09 DIAGNOSIS — E785 Hyperlipidemia, unspecified: Secondary | ICD-10-CM

## 2018-08-09 DIAGNOSIS — I2510 Atherosclerotic heart disease of native coronary artery without angina pectoris: Secondary | ICD-10-CM | POA: Diagnosis not present

## 2018-08-09 DIAGNOSIS — G459 Transient cerebral ischemic attack, unspecified: Secondary | ICD-10-CM

## 2018-08-09 DIAGNOSIS — I10 Essential (primary) hypertension: Secondary | ICD-10-CM | POA: Diagnosis not present

## 2018-08-09 NOTE — Progress Notes (Signed)
Cardiology Office Note:    Date:  08/09/2018   ID:  Chad Byrd, DOB 1928-02-16, MRN [id number]  PCP:  Darryll Capers, NP  Cardiologist:  Shirlee More, MD    Referring MD: Darryll Capers, NP    ASSESSMENT:    1. Coronary artery disease involving native coronary artery of native heart without angina pectoris   2. Essential hypertension   3. Hyperlipidemia, unspecified hyperlipidemia type   4. TIA (transient ischemic attack)    PLAN:    In order of problems listed above:  1. Stable CAD New York Heart Association class I continue current medical treatment including dual antiplatelet with his cerebrovascular disease beta-blocker and Imdur and recently initiated bempedoic acid as he is truly statin intolerant and did not take PCSK9 due to cost.  At this time will think he requires an ischemia evaluation 2. Stable hypertension BP at target continue treatment including ACE inhibitor check renal function potassium 3. Recheck lipids on current treatment and liver function 4. Stable no recurrence continue long-term dual antiplatelet therapy   Next appointment: 6 months   Medication Adjustments/Labs and Tests Ordered: Current medicines are reviewed at length with the patient today.  Concerns regarding medicines are outlined above.  No orders of the defined types were placed in this encounter.  No orders of the defined types were placed in this encounter.   Chief Complaint  Patient presents with  . Follow-up  . Coronary Artery Disease    History of Present Illness:    Chad Byrd is a 85 y.o. 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  He underwent coronary angiography December 2015 showing severe proximal left circumflex stenosis felt to be  high risk for intervention and was treated medically.  He underwent a an echocardiogram 10/15/2017 showing an  ejection fraction of 50-55 percent there is no segmental LV dysfunction and  mild to moderate aortic regurgitation.   He was seen by me 04/08/2018 when he presented with a focal TIA and was referred to [facility] ED for stroke evaluation.  He did not sustain a stroke and subsequently followed up with neurology on 04/11/2088.  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  He last seen 05/20/2018 and declined lipid-lowering treatment with PCSK9 due to cost. Compliance with diet, lifestyle and medications: Yes  He tolerates his current lipid-lowering therapy without muscle symptoms.  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 CAD (coronary artery disease)   . Cataract   . Hyperlipidemia   . Hypertension     Past Surgical History:  Procedure Laterality Date  . HERNIA REPAIR  1982  . RECTAL SURGERY  mid 1990s    Current Medications: Current Meds  Medication Sig  . aspirin EC 81 MG tablet Take 81 mg by mouth daily.  . Bempedoic Acid (NEXLETOL) 180 MG TABS Take 1 tablet by mouth daily.  . clopidogrel (PLAVIX) 75 MG tablet Take 1 tablet (75 mg total) by mouth daily.  [name] EPINEPHrine 0.3 mg/0.3 mL IJ SOAJ injection Inject 0.3 mg into the muscle as needed for anaphylaxis.   . finasteride (PROSCAR) 5 MG tablet Take 2.5 mg by mouth daily.   . isosorbide  mononitrate (IMDUR) 30 MG 24 hr tablet TAKE 1 TABLET BY MOUTH ONCE DAILY  . lisinopril (PRINIVIL,ZESTRIL) 5 MG tablet Take 1 tablet (5 mg total) by mouth daily.  . metoprolol succinate (TOPROL-XL) 25 MG 24 hr tablet TAKE 1 TABLET BY MOUTH ONCE DAILY  . Multiple Vitamins-Minerals (ONE-A-DAY MENS 50+ ADVANTAGE) TABS Take 1 tablet by mouth daily.   . nitroGLYCERIN (NITROSTAT) 0.4 MG SL tablet Place 0.4 mg under the tongue every 5 (five)  minutes as needed for chest pain.     Allergies:   Atorvastatin, Pravastatin, Simvastatin, and Statins   Social History   Socioeconomic History  . Marital status: Divorced    Spouse name: [name] = sign. other  . Number of children: 6  . Years of education: 62  . Highest education level: Not on file  Occupational History  . Occupation: Secretary/administrator  Social Needs  . Financial resource strain: Not on file  . Food insecurity    Worry: Not on file    Inability: Not on file  . Transportation needs    Medical: Not on file    Non-medical: Not on file  Tobacco Use  . Smoking status: Never Smoker  . Smokeless tobacco: Never Used  Substance and Sexual Activity  . Alcohol use: Yes    Comment: 1-2 oz of Scotch daily  . Drug use: No  . Sexual activity: Not on file  Lifestyle  . Physical activity    Days per week: Not on file    Minutes per session: Not on file  . Stress: Not on file  Relationships  . Social Herbalist on phone: Not on file    Gets together: Not on file    Attends religious service: Not on file    Active member of club or organization: Not on file    Attends meetings of clubs or organizations: Not on file    Relationship status: Not on file  Other Topics Concern  . Not on file  Social History Narrative   epworth sleepiness scale = 10 (09/29/15)   exercise - walk dogs daily, work on farm      Patent is right-handed. He lives with his girlfriend of 8 years, [name].    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     Family History: The patient's family history includes Alzheimer's disease in his brother; Lung cancer in his sister. ROS:   Please see the history of present illness.    All other systems reviewed and are negative.  EKGs/Labs/Other Studies Reviewed:    The following studies were reviewed today:  EKG:  EKG 05/30/2018 personally reviewed.  The ekg ordered today demonstrates sinus rhythm left axis deviation no  ischemic changes  Recent Labs: 05/23/2018: ALT 15; BUN 17; Creatinine, Ser 1.12; Hemoglobin 17.2; Platelets 171; Potassium 4.2; Sodium 140  Recent Lipid Panel    Component Value Date/Time   CHOL 182 02/20/2018 0859   CHOL 203 (H) 09/29/2015 1039   TRIG 137 02/20/2018 0859   TRIG 127 09/29/2015 1039   HDL 41 02/20/2018 0859   HDL 36 (L) 09/29/2015 1039   CHOLHDL 4.4 02/20/2018 0859   CHOLHDL 4.0 01/11/2017 0444   VLDL 30 01/11/2017 0444   LDLCALC 114 (H) 02/20/2018 0859   LDLCALC 142 (H) 09/29/2015 1039    Physical Exam:    VS:  BP 128/78 (BP Location: Right Arm, Patient Position: Sitting, Cuff Size: Normal)   Pulse 70   Temp 98.2  F (36.8 C)   Ht 5' 9.5" (1.765 m)   Wt 192 lb 3.2 oz (87.2 kg)   SpO2 96%   BMI 27.98 kg/m     Wt Readings from Last 3 Encounters:  08/09/18 192 lb 3.2 oz (87.2 kg)  05/31/18 198 lb (89.8 kg)  05/23/18 196 lb 3.4 oz (89 kg)     GEN:  Well nourished, well developed in no acute distress HEENT: Normal NECK: No JVD; No carotid bruits LYMPHATICS: No lymphadenopathy CARDIAC: RRR, no murmurs, rubs, gallops RESPIRATORY:  Clear to auscultation without rales, wheezing or rhonchi  ABDOMEN: Soft, non-tender, non-distended MUSCULOSKELETAL:  No edema; No deformity  SKIN: Warm and dry NEUROLOGIC:  Alert and oriented x 3 PSYCHIATRIC:  Normal affect    Signed, Shirlee More, MD  08/09/2018 11:33 AM    [place]

## 2018-08-09 NOTE — Patient Instructions (Signed)
Medication Instructions:  Your physician recommends that you continue on your current medications as directed. Please refer to the Current Medication list given to you today.  If you need a refill on your cardiac medications before your next appointment, please call your pharmacy.   Lab work: Your physician recommends that you return for lab work today: [place].   If you have labs (blood work) drawn today and your tests are completely normal, you will receive your results only by: [name] MyChart Message (if you have MyChart) OR . A paper copy in the mail If you have any lab test that is abnormal or we need to change your treatment, we will call you to review the results.  Testing/Procedures: None  Follow-Up: At [facility], you and your health needs are our priority.  As part of our continuing mission to provide you with exceptional heart care, we have created designated Provider Care Teams.  These Care Teams include your primary Cardiologist (physician) and Advanced Practice Providers (APPs -  Physician Assistants and Nurse Practitioners) who all work together to provide you with the care you need, when you need it. You will need a follow up appointment in 6 months.  Please call our office 2 months in advance to schedule this appointment.

## 2018-08-10 LAB — COMPREHENSIVE METABOLIC PANEL
ALT: 24 IU/L (ref 0–44)
AST: 29 IU/L (ref 0–40)
Albumin/Globulin Ratio: 1.7 (ref 1.2–2.2)
Albumin: 4.4 g/dL (ref 3.6–4.6)
Alkaline Phosphatase: 47 IU/L (ref 39–117)
BUN/Creatinine Ratio: 20 (ref 10–24)
BUN: 26 mg/dL (ref 8–27)
Bilirubin Total: 0.5 mg/dL (ref 0.0–1.2)
CO2: 24 mmol/L (ref 20–29)
Calcium: 9.5 mg/dL (ref 8.6–10.2)
Chloride: 100 mmol/L (ref 96–106)
Creatinine, Ser: 1.32 mg/dL — ABNORMAL HIGH (ref 0.76–1.27)
GFR calc Af Amer: 56 mL/min/{1.73_m2} — ABNORMAL LOW (ref >59)
GFR calc non Af Amer: 48 mL/min/{1.73_m2} — ABNORMAL LOW (ref >59)
Globulin, Total: 2.6 g/dL (ref 1.5–4.5)
Glucose: 87 mg/dL (ref 65–99)
Potassium: 5.1 mmol/L (ref 3.5–5.2)
Sodium: 141 mmol/L (ref 134–144)
Total Protein: 7 g/dL (ref 6.0–8.5)

## 2018-08-16 LAB — LIPID PANEL W/O CHOL/HDL RATIO
Cholesterol, Total: 172 mg/dL (ref 100–199)
HDL: 38 mg/dL — ABNORMAL LOW (ref >39)
LDL Calculated: 104 mg/dL — ABNORMAL HIGH (ref 0–99)
Triglycerides: 148 mg/dL (ref 0–149)
VLDL Cholesterol Cal: 30 mg/dL (ref 5–40)

## 2018-08-16 LAB — SPECIMEN STATUS REPORT

## 2018-09-03 ENCOUNTER — Other Ambulatory Visit: Payer: Self-pay | Admitting: Neurology

## 2018-09-04 ENCOUNTER — Encounter: Payer: Self-pay | Admitting: Neurology

## 2018-09-09 ENCOUNTER — Ambulatory Visit (INDEPENDENT_AMBULATORY_CARE_PROVIDER_SITE_OTHER): Payer: Medicare Other | Admitting: Neurology

## 2018-09-09 ENCOUNTER — Encounter: Payer: Self-pay | Admitting: Neurology

## 2018-09-09 ENCOUNTER — Other Ambulatory Visit: Payer: Self-pay

## 2018-09-09 VITALS — BP 133/72 | HR 59 | Temp 98.3°F | Ht 69.5 in | Wt 195.0 lb

## 2018-09-09 DIAGNOSIS — I679 Cerebrovascular disease, unspecified: Secondary | ICD-10-CM

## 2018-09-09 DIAGNOSIS — I2510 Atherosclerotic heart disease of native coronary artery without angina pectoris: Secondary | ICD-10-CM

## 2018-09-09 DIAGNOSIS — G459 Transient cerebral ischemic attack, unspecified: Secondary | ICD-10-CM | POA: Diagnosis not present

## 2018-09-09 NOTE — Progress Notes (Signed)
Follow-up Visit   Date: 09/09/18   Chad Byrd MRN: [id number] DOB: 1928/07/08   Interim History: Chad Byrd is a 85 y.o. right-handed Caucasian male with hypertension, hyperlipidemia, and CAD-handed returning to the clinic for follow-up of TIA secondary to known R M2 stenosis.  The patient was accompanied to the clinic by self.  History of present illness: He woke up on 3/9 and developed numbness over the entire left arm and left cheek.  There was no weakness or facial droop.  Tingling lasted about an hour, then resolved.  He had similar episode in December 2018.  He has not had symptoms recur.  He went to the ER on 3/9 where MRI brain was normal for age, no evidence of stroke.  He takes aspirin 81 mg daily.  He does not take statin therapy due to intolerance.  He was offered low-dose of pravastatin earlier this week, but stopped this due to side effects.  Unfortunately Repatha is cost prohibitive.  He takes Zetia 10 mg daily.  He drinks 1oz scotch several times per week.  UPDATE 09/09/2018:  He is here for follow-up visit.  He is doing very well and denies any new neurological complaints.  He has not had any further spells of numbness/tingling over the left face or arm.  He has been compliant with taking aspirin + plavix 75mg  daily since March.  He has noticed increased bruising of the arms.  He was started on bempedoic acid for cholesterol, since he has not been able to tolerate statin therapy in the past.  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   Medications:  Current Outpatient Medications on File Prior to Visit  Medication Sig Dispense Refill  . Bempedoic Acid (NEXLETOL) 180 MG TABS Take 1 tablet by mouth daily. 30 tablet 3  . clopidogrel (PLAVIX) 75 MG tablet TAKE 1 TABLET(75 MG) BY MOUTH DAILY 30 tablet 3  . EPINEPHrine 0.3 mg/0.3 mL IJ SOAJ injection Inject 0.3 mg into the muscle as needed for anaphylaxis.   2  .  finasteride (PROSCAR) 5 MG tablet     . isosorbide mononitrate (IMDUR) 30 MG 24 hr tablet TAKE 1 TABLET BY MOUTH ONCE DAILY 90 tablet 3  . lisinopril (ZESTRIL) 5 MG tablet TAKE 1 TABLET BY MOUTH EVERY DAY 90 tablet 1  . metoprolol succinate (TOPROL-XL) 25 MG 24 hr tablet TAKE 1 TABLET BY MOUTH ONCE DAILY 90 tablet 3  . diazepam (VALIUM) 5 MG tablet     . ezetimibe (ZETIA) 10 MG tablet     . finasteride (PROSCAR) 5 MG tablet Take 2.5 mg by mouth daily.     [name] lisinopril (ZESTRIL) 5 MG tablet     . Multiple Vitamin (MULTI-VITAMIN) tablet Take by mouth.    . nitroGLYCERIN (NITROSTAT) 0.4 MG SL tablet Place 0.4 mg under the tongue every 5 (five) minutes as needed for chest pain.    [name] PLAVIX 75 MG tablet      No current facility-administered medications on file prior to visit.     Allergies:  Allergies  Allergen Reactions  . Atorvastatin Other (See Comments)    Myalgia and causes extreme drowsiness  . Pravastatin Itching  . Simvastatin Other (See Comments)    Myalgia and extreme drowsiness   . Statins Other (See Comments)    Statins cause muscle aches and extreme drowsiness    Review of Systems:  CONSTITUTIONAL: No fevers, chills, night sweats, or weight loss.  EYES: No visual changes or eye pain ENT: No hearing changes.  No history of nose bleeds.   RESPIRATORY: No cough, wheezing and shortness of breath.   CARDIOVASCULAR: Negative for chest pain, and palpitations.   GI: Negative for abdominal discomfort, blood in stools or black stools.  No recent change in bowel habits.   GU:  No history of incontinence.   MUSCLOSKELETAL: No history of joint pain or swelling.  No myalgias.   SKIN: Negative for lesions, rash, and itching.   ENDOCRINE: Negative for cold or heat intolerance, polydipsia or goiter.   PSYCH:  No depression or anxiety symptoms.   NEURO: As Above.   Vital Signs:  BP 133/72   Pulse (!) 59   Temp 98.3 F (36.8 C)   Ht 5' 9.5" (1.765 m)   Wt 195 lb (88.5 kg)    SpO2 97%   BMI 28.38 kg/m    General Medical Exam:   General:  Well appearing - younger than stated age, comfortable  Eyes/ENT: see cranial nerve examination.   Neck:  No carotid bruits. Respiratory:  Clear to auscultation, good air entry bilaterally.   Cardiac:  Regular rate and rhythm, no murmur.   Ext:  No edema   Neurological Exam: MENTAL STATUS including orientation to time, place, person, recent and remote memory, attention span and concentration, language, and fund of knowledge is normal.  Speech is not dysarthric.  CRANIAL NERVES:  No visual field defects.  Pupils equal round and reactive to light.  Normal conjugate, extra-ocular eye movements in all directions of gaze.  No ptosis.  Face is symmetric. Facial sensation is normal to temperature and pin prick.  Palate elevates symmetrically.  Tongue is midline.  MOTOR:  Motor strength is 5/5 in all extremities.  No atrophy, fasciculations or abnormal movements.  No pronator drift.  Tone is normal.    MSRs:  Reflexes are 2+/4 throughout.  SENSORY:  Intact to vibration and temperature throughout.[name]  COORDINATION/GAIT:  Normal finger-to- nose-finger.  Intact rapid alternating movements bilaterally.  Gait narrow based and stable.   Data: MRI brain wo contrast 04/09/2018:  Normal noncontrast MRI head for age.  CTA head and neck 01/10/2017: 1. No emergent large vessel occlusion or high-grade stenosis. 2. Attenuated vascularity within the distal right MCA distribution.  No proximal or distal occlusion. Moderate narrowing of the distal M1 segment of the right MCA. 3. Bilateral carotid bifurcation atherosclerosis without hemodynamically significant stenosis. 4. Aortic Atherosclerosis (ICD10-I70.0).  MRI/A head and neck 05/23/2018: 1. Unremarkable appearance of the brain for age. No acute intracranial abnormality. 2. No evidence of major intracranial arterial occlusion or flow limiting proximal stenosis. 3. Unchanged asymmetric right  MCA branch vessel irregularity including mild-to-moderate proximal right M2 stenosis. 4. Widely patent cervical carotid arteries. 5. Patent vertebral arteries with mild proximal stenosis on the right.  IMPRESSION/PLAN: TIA manifesting with episodic left face and arm paresthesias due to known R M2 stenosis.  MRI/A brain did not show acute stroke or progression of stenosis and was personally viewed. He has completed almost 5 months of dual antiplatelet therapy, so will transition to monotherapy with plavix 75mg  alone. He is taking bempepoic acid for hyperlipidemia.  He is intolerant to statin therapy. Continue BP management with goal of SBP 110-140s.  Lipid panel will be requested from his PCP's office for our records. If he developed more neurological events, he may need cerebral angiogram to better evaluate intracranial vasculature. His baseline neurological status is very good and he is  very active for his age.   Stroke warning signs again discussed.   Return to clinic in 6 months.   Greater than 50% of this 25 minute visit was spent in counseling, explanation of diagnosis, planning of further management, and coordination of care.  Thank you for allowing me to participate in patient's care.  If I can answer any additional questions, I would be pleased to do so.    Sincerely,     K. Posey Pronto, DO

## 2018-09-09 NOTE — Patient Instructions (Signed)
Keep up with staying active as you are!  Stop aspirin  Continue plavix 75mg  daily  Return to clinic in 6 months

## 2018-09-13 ENCOUNTER — Telehealth: Payer: Self-pay | Admitting: Cardiology

## 2018-09-13 ENCOUNTER — Telehealth: Payer: Self-pay | Admitting: Neurology

## 2018-09-13 NOTE — Telephone Encounter (Signed)
Spoke with spouse she will contact PCP office to have someone address this issue.

## 2018-09-13 NOTE — Telephone Encounter (Signed)
Calling in about a reaction to medication. He has been itching for the past couple of days. Please call her back at [phone]. Thanks!

## 2018-09-13 NOTE — Telephone Encounter (Signed)
I am only writing his plavix and he has been on this for > 6 months, so this is unlikely to cause his itching.    I don't know about any of his other medications - there should be a provider covering who can guide him from his PCP's office.

## 2018-09-13 NOTE — Telephone Encounter (Signed)
Pt c/o:  side effect on medication New medication started: Unknown which medication is causing itching  When did they start medication? Plavix 75 mg once a day  6 Month- Year When did side effects start? A week ago Side effects reported: itching arms,legs, head Still taking medication? Yes.     Pt spouse called PCP office regarding itching PCP out until next week so she called here wanting to know if its any of his medications causing his itching. Taken benadryl which is helping some.

## 2018-09-13 NOTE — Telephone Encounter (Signed)
Spoke with patient's partner, [name], per DPR who states that the patient has been experiencing increased itching within the last week. He is having to take benadryl daily and they want to know could this be caused by any of the medications he is taking currently. Will have Dr. Bettina Gavia advise.   Also, [name] states that patient went to see his neurologist on 09/09/2018 who is recommending him discontinue aspirin due to increased bruising. Please advise.

## 2018-09-13 NOTE — Telephone Encounter (Signed)
Stop lisinopril to see if improved

## 2018-09-16 NOTE — Telephone Encounter (Signed)
Telephone call to patient. Informed him to stop lisinopril per Dr. Bettina Gavia. Pt. Verbalized understanding.

## 2018-09-26 DIAGNOSIS — L300 Nummular dermatitis: Secondary | ICD-10-CM | POA: Diagnosis not present

## 2018-09-26 DIAGNOSIS — B86 Scabies: Secondary | ICD-10-CM | POA: Diagnosis not present

## 2018-09-26 DIAGNOSIS — L821 Other seborrheic keratosis: Secondary | ICD-10-CM | POA: Diagnosis not present

## 2018-10-08 ENCOUNTER — Telehealth: Payer: Self-pay | Admitting: *Deleted

## 2018-10-08 ENCOUNTER — Other Ambulatory Visit: Payer: Self-pay | Admitting: *Deleted

## 2018-10-08 DIAGNOSIS — I2510 Atherosclerotic heart disease of native coronary artery without angina pectoris: Secondary | ICD-10-CM

## 2018-10-08 DIAGNOSIS — I712 Thoracic aortic aneurysm, without rupture, unspecified: Secondary | ICD-10-CM

## 2018-10-08 DIAGNOSIS — Z01812 Encounter for preprocedural laboratory examination: Secondary | ICD-10-CM

## 2018-10-08 NOTE — Telephone Encounter (Signed)
Patient is agreeable to have his yearly CTA chest aorta w/wo contrast completed at the [facility]. Will arrange for this to be schedule and let the patient know the date and time for testing. Patient will have pre-procedure lab work drawn (BMP) 3-7 days before testing in the [place] office. No appointment needed. No need to fast beforehand.

## 2018-10-08 NOTE — Telephone Encounter (Signed)
-----   Message from Richardo Priest, MD sent at 10/01/2018 10:31 AM EDT ----- Regarding: FW: CT angio chest/aorta for thoracic aortic aneurysm follow up Still at the med center in [facility] ----- Message ----- From: Austin Miles, RN Sent: 10/01/2018  10:23 AM EDT To: Richardo Priest, MD Subject: Melton Alar: CT angio chest/aorta for thoracic aortic#  Please advise if you would like me to schedule this in the [place] office for October. Thanks!  ----- Message ----- From: Fidel Levy, RN Sent: 10/01/2018   7:40 AM EDT To: Austin Miles, RN Subject: CT angio chest/aorta for thoracic aortic ane#  Hello,   This patient used to follow with Dr. Debara Pickett but it looks like Dr. Bettina Gavia is seeing him now. We had been getting CTs on him for his thoracic aneurysm and he is due for his yearly one end of October. Just wanted to send you a message on this, as a reminder to myself just popped up in my basket. There is an order already in Epic but I'm not sure where y'all send folks to have these done in [place].   Thanks,  Armed forces technical officer

## 2018-10-09 ENCOUNTER — Encounter

## 2018-10-09 ENCOUNTER — Ambulatory Visit (INDEPENDENT_AMBULATORY_CARE_PROVIDER_SITE_OTHER): Payer: Medicare Other | Admitting: Pharmacist

## 2018-10-09 ENCOUNTER — Other Ambulatory Visit: Payer: Self-pay

## 2018-10-09 DIAGNOSIS — E785 Hyperlipidemia, unspecified: Secondary | ICD-10-CM

## 2018-10-09 DIAGNOSIS — T4995XA Adverse effect of unspecified topical agent, initial encounter: Secondary | ICD-10-CM

## 2018-10-09 DIAGNOSIS — I2510 Atherosclerotic heart disease of native coronary artery without angina pectoris: Secondary | ICD-10-CM

## 2018-10-09 HISTORY — DX: Adverse effect of unspecified topical agent, initial encounter: T49.95XA

## 2018-10-09 NOTE — Progress Notes (Signed)
Patient ID: Chad Byrd                 DOB: February 28, 1928                    MRN: [id number]     HPI: Chad Byrd is a 85 y.o. male patient referred to lipid clinic by Dr Bettina Gavia. PMH is significant for CAD with severe proximal circumflex stenosis, HLD, HTN, thoracic aneurysm, and TIA. He has a history of statin intolerance and has also been dealing with itching/rash thought to be drug-induced.  Patient presents today with his girlfriend. His main complaint is his itching and rash. He first noticed an issue when he went to the ER 1 year ago at [facility] - he was red from head to toe, even the bottom of his feet were red. He was given IV Benadryl and prednisone which resolved his symptoms. He uses Zyrtec and Benadryl as needed. 6 weeks ago, the itching started again. He has noticed a rash on his chest and upper back. Zyrtec and Benadryl are still helping with the itching. He went to his dermatologist who prescribed him prednisone. Symptoms improved, then came back a few days ago. They cannot correlate any trigger to the itching from a few days ago.   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 No OTC herbals or supplements aside from multivitamin. No dye allergies noted. He called into [facility] office on 8/17 with itching and was advised he could try stopping his lisinopril. He stopped lisinopril for 2 weeks, did not notice a big difference in itching. He also tried stopping his Nexletol and has been off of this for a few months now. Copay is also expensive at $108 per month, pt not interested in PCSK9i injections. Pt tried calling into neurology office about clopidogrel, was advised that this is not likely the cause of his itching since he has been on clopidogrel for a few years.  Medications pt was taking last year when his first skin reaction was noted: clopidogrel, isosorbide, lisinopril, and metoprolol. Both finasteride and Nexletol were  started after this episode. He stopped ezetimibe secondary to myalgias.  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 He has never used smokeless tobacco. He reports that he drinks alcohol. He reports that he does not use drugs.  Labs: 08/09/18: TC 172, TG 148, HDL 38, LDL 104 (Nexletol 180mg  daily)  Past Medical History:  Diagnosis Date  . CAD (coronary artery disease)   . Cataract   . Hyperlipidemia   . Hypertension     Current Outpatient Medications on File Prior to Visit  Medication Sig Dispense Refill  . Bempedoic Acid (NEXLETOL) 180 MG TABS Take 1 tablet by mouth daily. 30 tablet 3  . clopidogrel (PLAVIX) 75 MG tablet TAKE 1 TABLET(75 MG) BY MOUTH DAILY 30 tablet 3  . diazepam (VALIUM) 5 MG tablet     . EPINEPHrine 0.3 mg/0.3 mL IJ SOAJ injection Inject 0.3 mg into the muscle as needed for anaphylaxis.   2  . ezetimibe (ZETIA) 10 MG tablet     .  finasteride (PROSCAR) 5 MG tablet Take 2.5 mg by mouth daily.     . finasteride (PROSCAR) 5 MG tablet     . isosorbide mononitrate (IMDUR) 30 MG 24 hr tablet TAKE 1 TABLET BY MOUTH ONCE DAILY 90 tablet 3  . lisinopril (ZESTRIL) 5 MG tablet TAKE 1 TABLET BY MOUTH EVERY DAY 90 tablet 1  . lisinopril (ZESTRIL) 5 MG tablet     . metoprolol succinate (TOPROL-XL) 25 MG 24 hr tablet TAKE 1 TABLET BY MOUTH ONCE DAILY 90 tablet 3  . Multiple Vitamin (MULTI-VITAMIN) tablet Take by mouth.    . nitroGLYCERIN (NITROSTAT) 0.4 MG SL tablet Place 0.4 mg under the tongue every 5 (five) minutes as needed for chest pain.    [name] PLAVIX 75 MG  tablet      No current facility-administered medications on file prior to visit.     Allergies  Allergen Reactions  . Atorvastatin Other (See Comments)    Myalgia and causes extreme drowsiness  . Pravastatin Itching  . Simvastatin Other (See Comments)    Myalgia and extreme drowsiness   . Statins Other (See Comments)    Statins cause muscle aches and extreme drowsiness    Assessment/Plan:  1. Hypersensitivity reaction - Potentially drug-induced, no changes in detergent, clothing, diet, no additional insight from dermatology. Symptoms have been off and on over the past year - 4 consistent medications throughout all episodes have been clopidogrel, isosorbide, lisinopril, and metoprolol. Since symptoms wax and wane, discussed that the pharmacy might be changing the manufacturers for his generic medications (they did note that 1 refill of lisinopril was yellow and his pills are usually white). Different manufacturers may use different dyes, fillers, and inactive ingredients - he may be allergic to one of these rather than the actual active ingredient in the medication since his rash/itching have not been consistent. Encouraged pt to keep a journal including medication color, shape, imprint, and take photos from month to month. He will look for any correlation in itching/rash with any potential changes in medication refills/manufacturers. If correlation is found, pharmacy can order specific manufacturer of medication to avoid reaction in the future. Advised he can continue to use Zyrtec and Benadryl prn for itching.  2. Hyperlipidemia - LDL 104 on Nexletol, above goal < 70 due to ASCVD. Pt is intolerant to 4 statins and Zetia and does not wish to pursue injectable PCSK9i medication. Copay for Nexletol is expensive at $108 per month. Submitted application for pt assistance through [facility] which was approved for $2500 in copay assistance for the next 12 months - his copays will now be  free.  Chad Byrd E. Shakisha Abend, PharmD, BCACP, Chad Byrd [id number] [address], [place], [place] [address] Phone: [phone]; Fax: [phone] 10/09/2018 10:27 AM

## 2018-11-08 ENCOUNTER — Encounter: Payer: Self-pay | Admitting: Neurology

## 2018-11-11 DIAGNOSIS — J029 Acute pharyngitis, unspecified: Secondary | ICD-10-CM | POA: Diagnosis not present

## 2018-11-11 DIAGNOSIS — Z6828 Body mass index (BMI) 28.0-28.9, adult: Secondary | ICD-10-CM | POA: Diagnosis not present

## 2018-11-11 DIAGNOSIS — R0982 Postnasal drip: Secondary | ICD-10-CM | POA: Diagnosis not present

## 2018-11-22 NOTE — Telephone Encounter (Signed)
Left message reminding patient to go to the [place] office for lab work either this afternoon or Monday morning prior to his CTA chest aorta on Tuesday, 11/26/2018, at 10:00 am at the [facility]. Advised patient to contact our office with any further questions or concerns.

## 2018-11-25 ENCOUNTER — Telehealth: Payer: Self-pay | Admitting: *Deleted

## 2018-11-25 DIAGNOSIS — Z01812 Encounter for preprocedural laboratory examination: Secondary | ICD-10-CM | POA: Diagnosis not present

## 2018-11-25 DIAGNOSIS — L821 Other seborrheic keratosis: Secondary | ICD-10-CM | POA: Diagnosis not present

## 2018-11-25 DIAGNOSIS — I712 Thoracic aortic aneurysm, without rupture: Secondary | ICD-10-CM | POA: Diagnosis not present

## 2018-11-25 DIAGNOSIS — L300 Nummular dermatitis: Secondary | ICD-10-CM | POA: Diagnosis not present

## 2018-11-25 DIAGNOSIS — I2510 Atherosclerotic heart disease of native coronary artery without angina pectoris: Secondary | ICD-10-CM | POA: Diagnosis not present

## 2018-11-25 DIAGNOSIS — C44629 Squamous cell carcinoma of skin of left upper limb, including shoulder: Secondary | ICD-10-CM | POA: Diagnosis not present

## 2018-11-25 DIAGNOSIS — L570 Actinic keratosis: Secondary | ICD-10-CM | POA: Diagnosis not present

## 2018-11-25 LAB — BASIC METABOLIC PANEL
BUN/Creatinine Ratio: 17 (ref 10–24)
BUN: 22 mg/dL (ref 8–27)
CO2: 25 mmol/L (ref 20–29)
Calcium: 9.5 mg/dL (ref 8.6–10.2)
Chloride: 99 mmol/L (ref 96–106)
Creatinine, Ser: 1.29 mg/dL — ABNORMAL HIGH (ref 0.76–1.27)
GFR calc Af Amer: 57 mL/min/{1.73_m2} — ABNORMAL LOW (ref >59)
GFR calc non Af Amer: 49 mL/min/{1.73_m2} — ABNORMAL LOW (ref >59)
Glucose: 91 mg/dL (ref 65–99)
Potassium: 4.6 mmol/L (ref 3.5–5.2)
Sodium: 138 mmol/L (ref 134–144)

## 2018-11-25 NOTE — Telephone Encounter (Signed)
Left message reminding patient to come to the [place] office for lab work before his scheduled CTA chest aorta w/wo contrast scheduled for tomorrow, 11/26/2018, at 10:00 am at the [facility].   Patient arrived for lab work right after I hung up from leaving voicemail. Results will be available before scheduled testing tomorrow morning. Patient provided with address and phone number for the [place] location. No further questions.

## 2018-11-26 ENCOUNTER — Other Ambulatory Visit: Payer: Self-pay

## 2018-11-26 ENCOUNTER — Telehealth: Payer: Self-pay | Admitting: Cardiology

## 2018-11-26 ENCOUNTER — Ambulatory Visit (HOSPITAL_BASED_OUTPATIENT_CLINIC_OR_DEPARTMENT_OTHER)
Admission: RE | Admit: 2018-11-26 | Discharge: 2018-11-26 | Disposition: A | Discharge status: Home / Self Care | Payer: Medicare Other | Source: Ambulatory Visit | Attending: Internal Medicine | Admitting: Internal Medicine

## 2018-11-26 ENCOUNTER — Telehealth: Payer: Self-pay

## 2018-11-26 ENCOUNTER — Encounter: Payer: Self-pay | Admitting: Internal Medicine

## 2018-11-26 DIAGNOSIS — I2699 Other pulmonary embolism without acute cor pulmonale: Secondary | ICD-10-CM

## 2018-11-26 DIAGNOSIS — I712 Thoracic aortic aneurysm, without rupture: Secondary | ICD-10-CM | POA: Diagnosis not present

## 2018-11-26 MED ORDER — IOHEXOL 350 MG/ML SOLN
100.0000 mL | Freq: Once | INTRAVENOUS | Status: AC | PRN
  Administered 2018-11-26: 100 mL via INTRAVENOUS

## 2018-11-26 NOTE — Telephone Encounter (Signed)
Unexpectedly has evidence of embolism to his lungs.  He needs to start Eliquis 5 mg twice daily in addition to his clopidogrel and I would like him to get worked in with [place] this week in the [facility] office as he will be taking combined antiplatelet anticoagulant.

## 2018-11-26 NOTE — Telephone Encounter (Signed)
I received the CT scan report he has 2 small peripheral pulmonary emboli.  I called him there is no answer I left a message on the phone the office tomorrow.  He should be placed on anticoagulant Eliquis 5 mg twice daily continue to clopidogrel and I would like him to be seen in the office this week with Laurann Montana, NP predominantly to assess the clinical context here there is evidence of deep vein thrombosis whether this is been a provoked event with recent medical illness surgical intervention trauma and to assess his bleeding risk with combined antithrombotic therapy clopidogrel and anticoagulant.  I think were safe to wait for him to call the office this was incidental asymptomatic and small volume and no evidence of right ventricular dysfunction.

## 2018-11-26 NOTE — Telephone Encounter (Signed)
Received a call from [place] with [facility] Radiology calling to report patient's chest cta which revealed incidental pulmonary emboli in upper and lower lobe of the right pulmonary artery.She stated a report will be sent to Dr.Hilty in epic. After reviewing chart patient is Dr.Brian Munley's patient.Spoke to Dr.Munley's RN Robyne Peers report given. Spoke to [facility] advised to send report to [place].

## 2018-11-27 MED ORDER — APIXABAN 5 MG PO TABS: 5.0000 mg | ORAL_TABLET | Freq: Two times a day (BID) | ORAL | 1 refills | Status: DC

## 2018-11-27 NOTE — Addendum Note (Signed)
Addended by: Loel Dubonnet on: 11/27/2018 09:11 AM   Modules accepted: Orders

## 2018-11-27 NOTE — Telephone Encounter (Signed)
Spoke with Mr. Chad Byrd as well as [name].   Informed of stable thoracic aortic aneurysm. Informed of incidental finding of 2 small peripheral PE. He is agreeable to start Eliquis. Robyne Peers, RN has pulled samples at the [facility] office and Miss Rene Kocher will pick up today. He will continue Plavix as well until our discussion in the office. Does note he bruises very easily with Plavix presently and is concerned regarding remaining on both.  He has been scheduled for an office visit tomorrow with me in the [facility] office. Verbalized understanding of appointment time and location.   Denies SOB. Endorses some cough over the last few weeks that has been improving.   Loel Dubonnet, NP

## 2018-11-28 ENCOUNTER — Encounter: Payer: Self-pay | Admitting: Family

## 2018-11-28 ENCOUNTER — Other Ambulatory Visit: Payer: Self-pay

## 2018-11-28 ENCOUNTER — Ambulatory Visit (INDEPENDENT_AMBULATORY_CARE_PROVIDER_SITE_OTHER): Payer: Medicare Other | Admitting: Family

## 2018-11-28 VITALS — BP 140/66 | HR 65 | Ht 69.0 in | Wt 191.0 lb

## 2018-11-28 DIAGNOSIS — Z7901 Long term (current) use of anticoagulants: Secondary | ICD-10-CM

## 2018-11-28 DIAGNOSIS — I10 Essential (primary) hypertension: Secondary | ICD-10-CM | POA: Diagnosis not present

## 2018-11-28 DIAGNOSIS — E782 Mixed hyperlipidemia: Secondary | ICD-10-CM | POA: Diagnosis not present

## 2018-11-28 DIAGNOSIS — I712 Thoracic aortic aneurysm, without rupture, unspecified: Secondary | ICD-10-CM

## 2018-11-28 DIAGNOSIS — I2510 Atherosclerotic heart disease of native coronary artery without angina pectoris: Secondary | ICD-10-CM

## 2018-11-28 DIAGNOSIS — I2699 Other pulmonary embolism without acute cor pulmonale: Secondary | ICD-10-CM | POA: Diagnosis not present

## 2018-11-28 NOTE — Patient Instructions (Signed)
Medication Instructions:  No medication changes.  *If you need a refill on your cardiac medications before your next appointment, please call your pharmacy*  Lab Work: Your physician recommends that you return for lab work today: CBC If you have labs (blood work) drawn today and your tests are completely normal, you will receive your results only by: [name] MyChart Message (if you have MyChart) OR . A paper copy in the mail If you have any lab test that is abnormal or we need to change your treatment, we will call you to review the results.  Testing/Procedures: None today.  Follow-Up: At [facility], you and your health needs are our priority.  As part of our continuing mission to provide you with exceptional heart care, we have created designated Provider Care Teams.  These Care Teams include your primary Cardiologist (physician) and Advanced Practice Providers (APPs -  Physician Assistants and Nurse Practitioners) who all work together to provide you with the care you need, when you need it.  Your next appointment:   3 months  The format for your next appointment:   In Person  Provider:   Shirlee More, MD  Other Instructions  If you notice blood in your urine or blood in your stool, please call our office.   I will check with Dr. Michele Mcalpine about your appointment tomorrow and give you a call this afternoon.    Pulmonary Embolism  A pulmonary embolism (PE) is a sudden blockage or decrease of blood flow in one or both lungs. Most blockages come from a blood clot that forms in the vein of a lower leg, thigh, or arm (deep vein thrombosis, DVT) and travels to the lungs. A clot is blood that has thickened into a gel or solid. PE is a dangerous and life-threatening condition that needs to be treated right away. What are the causes? This condition is usually caused by a blood clot that forms in a vein and moves to the lungs. In rare cases, it may be caused by air, fat, part of a tumor, or  other tissue that moves through the veins and into the lungs. What increases the risk? The following factors may make you more likely to develop this condition:  Experiencing a traumatic injury, such as breaking a hip or leg.  Having: ? A spinal cord injury. ? Orthopedic surgery, especially hip or knee replacement. ? Any major surgery. ? A stroke. ? DVT. ? Blood clots or blood clotting disease. ? Long-term (chronic) lung or heart disease. ? Cancer treated with chemotherapy. ? A central venous catheter.  Taking medicines that contain estrogen. These include birth control pills and hormone replacement therapy.  Being: ? Pregnant. ? In the period of time after your baby is delivered (postpartum). ? Older than age 41. ? Overweight. ? A smoker, especially if you have other risks. What are the signs or symptoms? Symptoms of this condition usually start suddenly and include:  Shortness of breath during activity or at rest.  Coughing, coughing up blood, or coughing up blood-tinged mucus.  Chest pain that is often worse with deep breaths.  Rapid or irregular heartbeat.  Feeling light-headed or dizzy.  Fainting.  Feeling anxious.  Fever.  Sweating.  Pain and swelling in a leg. This is a symptom of DVT, which can lead to PE. How is this diagnosed? This condition may be diagnosed based on:  Your medical history.  A physical exam.  Blood tests.  CT pulmonary angiogram. This test checks blood  flow in and around your lungs.  Ventilation-perfusion scan, also called a lung VQ scan. This test measures air flow and blood flow to the lungs.  An ultrasound of the legs. How is this treated? Treatment for this condition depends on many factors, such as the cause of your PE, your risk for bleeding or developing more clots, and other medical conditions you have. Treatment aims to remove, dissolve, or stop blood clots from forming or growing larger. Treatment may include:   Medicines, such as: ? Blood thinning medicines (anticoagulants) to stop clots from forming. ? Medicines that dissolve clots (thrombolytics).  Procedures, such as: ? Using a flexible tube to remove a blood clot (embolectomy) or to deliver medicine to destroy it (catheter-directed thrombolysis). ? Inserting a filter into a large vein that carries blood to the heart (inferior vena cava). This filter (vena cava filter) catches blood clots before they reach the lungs. ? Surgery to remove the clot (surgical embolectomy). This is rare. You may need a combination of immediate, long-term (up to 3 months after diagnosis), and extended (more than 3 months after diagnosis) treatments. Your treatment may continue for several months (maintenance therapy). You and your health care provider will work together to choose the treatment program that is best for you. Follow these instructions at home: Medicines  Take over-the-counter and prescription medicines only as told by your health care provider.  If you are taking an anticoagulant medicine: ? Take the medicine every day at the same time each day. ? Understand what foods and drugs interact with your medicine. ? Understand the side effects of this medicine, including excessive bruising or bleeding. Ask your health care provider or pharmacist about other side effects. General instructions  Wear a medical alert bracelet or carry a medical alert card that says you have had a PE and lists what medicines you take.  Ask your health care provider when you may return to your normal activities. Avoid sitting or lying for a long time without moving.  Maintain a healthy weight. Ask your health care provider what weight is healthy for you.  Do not use any products that contain nicotine or tobacco, such as cigarettes, e-cigarettes, and chewing tobacco. If you need help quitting, ask your health care provider.  Talk with your health care provider about any travel  plans. It is important to make sure that you are still able to take your medicine while on trips.  Keep all follow-up visits as told by your health care provider. This is important. Contact a health care provider if:  You missed a dose of your blood thinner medicine. Get help right away if:  You have: ? New or increased pain, swelling, warmth, or redness in an arm or leg. ? Numbness or tingling in an arm or leg. ? Shortness of breath during activity or at rest. ? A fever. ? Chest pain. ? A rapid or irregular heartbeat. ? A severe headache. ? Vision changes. ? A serious fall or accident, or you hit your head. ? Stomach (abdominal) pain. ? Blood in your vomit, stool, or urine. ? A cut that will not stop bleeding.  You cough up blood.  You feel light-headed or dizzy.  You cannot move your arms or legs.  You are confused or have memory loss. These symptoms may represent a serious problem that is an emergency. Do not wait to see if the symptoms will go away. Get medical help right away. Call your local emergency services (911 in  the [place] Do not drive yourself to the hospital. Summary  A pulmonary embolism (PE) is a sudden blockage or decrease of blood flow in one or both lungs. PE is a dangerous and life-threatening condition that needs to be treated right away.  Treatments for this condition usually include medicines to thin your blood (anticoagulants) or medicines to break apart blood clots (thrombolytics).  If you are given blood thinners, it is important to take the medicine every day at the same time each day.  Understand what foods and drugs interact with any medicines that you are taking.  If you have signs of PE or DVT, call your local emergency services (911 in the [place] This information is not intended to replace advice given to you by your health care provider. Make sure you discuss any questions you have with your health care provider. Document Released: 01/14/2000  Document Revised: 10/24/2017 Document Reviewed: 10/24/2017 Elsevier Patient Education  2020 [employer].

## 2018-11-28 NOTE — Progress Notes (Signed)
Office Visit    Patient Name: Chad Byrd Date of Encounter: 11/28/2018  Primary Care Provider:  Elenore Paddy, NP Primary Cardiologist:  Shirlee More, MD Electrophysiologist:  None   Chief Complaint    Chad Byrd is a 85 y.o. male with a hx of CAD with severe proximal circumflex stenosis, HLD, HTN, thoracic aneurysm, TIA presents today for follow-up after CT inadvertently discovered PE  Past Medical History    Past Medical History:  Diagnosis Date  . CAD (coronary artery disease)   . Cataract   . Hyperlipidemia   . Hypertension    Past Surgical History:  Procedure Laterality Date  . HERNIA REPAIR  1982  . RECTAL SURGERY  mid 1990s    Allergies  Allergies  Allergen Reactions  . Atorvastatin Other (See Comments)    Myalgia and causes extreme drowsiness  . Pravastatin Itching  . Simvastatin Other (See Comments)    Myalgia and extreme drowsiness   . Statins Other (See Comments)    Statins cause muscle aches and extreme drowsiness    History of Present Illness    Chad Byrd is a 85 y.o. male with a hx of  CAD with severe proximal circumflex stenosis, HLD, HTN, thoracic aneurysm, TIA last seen by Dr. Bettina Gavia 07/2018.  Cardiac cath 12/2013 with severe prox LCx stenosis felt high risk for intervention, treated medically and moderate stenosis RCA and LAD. Echo 10/15/17 with Ef 50-55%, mild to moderate aortic regurgitation. Seen by Dr. Bettina Gavia 04/08/2018 presented with focal TIA sent to [facility] ED. Did not sustain CVA and subsequent follow up with neurology 04/12/18. CT scan with stenosis intracranial of the R middle cerebral artery MI.   Presents today with his wife. CT scan for monitoring of thoracic aortic aneurysm with incidental finding of small peripheral PE in lower lobe branch of R pulmonary artery and smaller embolus in upper lobe branch of R pulmonary artery. He was intiated on Eliquis 5mg  BID via telephone and presents today for follow up.   Reports no  SOB, no DOE. Did have bad cough approx 1.5 week ago with sore throat, seen by PCP recommended for symptom management. Has been improving dramatically with fluids and throat lozenges. Tells me cough was very productive and is still somewhat productive.   No recent surgery. 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  Of note, he does self cath at home. Previous hematuria but this has resolved with addition of Proscar to his medication regimen approximately 6 months ago.   He reports no upper nor lower extremity swelling. No erythema to his extremities. No pain to his extremitites.   EKGs/Labs/Other Studies Reviewed:   The following studies were reviewed today:  CT Angio Chest 11/26/18 FINDINGS: Cardiovascular: Atherosclerosis of thoracic aorta is noted without dissection. Grossly stable 4.3 cm ascending thoracic aortic aneurysm is noted. Transverse aortic arch measures 2.7 cm. Proximal descending thoracic aorta measures 3.3 cm. Great vessels are widely patent. Normal cardiac size. No pericardial effusion. Mild coronary artery calcifications are noted. Incidental note is made of small peripheral pulmonary embolus in a lower lobe branch of the right pulmonary artery, as well as a smaller embolus seen in upper lobe branch of right pulmonary artery.   Mediastinum/Nodes: Thyroid gland is unremarkable. The esophagus is  unremarkable. Calcified pretracheal and subcarinal adenopathy is noted most consistent with prior granulomatous disease.   Lungs/Pleura: Lungs are clear. No pleural effusion or pneumothorax.   Upper Abdomen: Cholelithiasis is noted. Calcified splenic granulomata are noted.   Musculoskeletal: No chest wall abnormality. No acute or significant osseous findings.   Review of the  MIP images confirms the above findings.   IMPRESSION: Incidental note is made of small peripheral pulmonary emboli in upper and lower lobe branches of the right pulmonary artery. These results will be called to the ordering clinician or representative by the Radiologist Assistant, and communication documented in the PACS or zVision Dashboard.   Grossly stable 4.3 cm ascending thoracic aortic aneurysm is noted. Recommend annual imaging followup by CTA or MRA. This recommendation follows 2010 ACCF/AHA/AATS/ACR/ASA/SCA/SCAI/SIR/STS/SVM Guidelines for the Diagnosis and Management of Patients with Thoracic Aortic Disease. Circulation. 2010; [id number]. Aortic aneurysm NOS (ICD10-I71.9).   Mild coronary artery calcifications are noted.   Cholelithiasis.   Aortic Atherosclerosis (ICD10-I70.0).     EKG: No EKG today  Recent Labs: 05/23/2018: Hemoglobin 17.2; Platelets 171 08/09/2018: ALT 24 11/25/2018: BUN 22; Creatinine, Ser 1.29; Potassium 4.6; Sodium 138  Recent Lipid Panel    Component Value Date/Time   CHOL 172 08/09/2018 1146   CHOL 203 (H) 09/29/2015 1039   TRIG 148 08/09/2018 1146   TRIG 127 09/29/2015 1039   HDL 38 (L) 08/09/2018 1146   HDL 36 (L) 09/29/2015 1039   CHOLHDL 4.4 02/20/2018 0859   CHOLHDL 4.0 01/11/2017 0444   VLDL 30 01/11/2017 0444   LDLCALC 104 (H) 08/09/2018 1146   LDLCALC 142 (H) 09/29/2015 1039    Home Medications   Current Meds  Medication Sig  . apixaban (ELIQUIS) 5 MG TABS tablet Take 1 tablet (5 mg total) by mouth 2 (two) times daily.  . Bempedoic Acid (NEXLETOL) 180 MG TABS Take 1 tablet by mouth daily.  . clopidogrel (PLAVIX) 75 MG tablet TAKE 1 TABLET(75 MG) BY MOUTH DAILY  . EPINEPHrine 0.3 mg/0.3 mL IJ SOAJ injection Inject 0.3 mg into the muscle as needed for anaphylaxis.   . finasteride (PROSCAR) 5 MG tablet Take 5 mg by mouth daily.   . isosorbide mononitrate (IMDUR) 30 MG 24 hr tablet TAKE 1 TABLET BY MOUTH ONCE DAILY  .  lisinopril (ZESTRIL) 5 MG tablet TAKE 1 TABLET BY MOUTH EVERY DAY  . metoprolol succinate (TOPROL-XL) 25 MG 24 hr tablet TAKE 1 TABLET BY MOUTH ONCE DAILY  . Multiple Vitamin (MULTI-VITAMIN) tablet Take by mouth.  . nitroGLYCERIN (NITROSTAT) 0.4 MG SL tablet Place 0.4 mg under the tongue every 5 (five) minutes as needed for chest pain.      Review of Systems   Review of Systems  Constitution: Negative for chills, fever and malaise/fatigue.  HENT: Negative for congestion and sore throat.   Cardiovascular: Negative for chest pain, dyspnea on exertion, irregular heartbeat, leg swelling, near-syncope, orthopnea, palpitations, paroxysmal nocturnal dyspnea and syncope.  Respiratory: Positive for cough. Negative for shortness of breath and wheezing.   Gastrointestinal: Negative for melena, nausea and vomiting.  Genitourinary: Negative for hematuria.  Neurological: Negative for dizziness, light-headedness and weakness.   All other systems reviewed and are otherwise negative except as noted above.  Physical Exam    VS:  BP 140/66 (BP Location: Right Arm, Patient Position: Sitting, Cuff Size: Large)   Pulse 65   Ht 5\' 9"  (1.753 m)   Wt 191 lb (86.6 kg)   SpO2 98%   BMI 28.21  kg/m  , BMI Body mass index is 28.21 kg/m. GEN: Well nourished, well developed, in no acute distress. HEENT: normal. Neck: Supple, no JVD, carotid bruits, or masses. Cardiac: RRR, no murmurs, rubs, or gallops. No clubbing, cyanosis, edema.  Radials/DP/PT 2+ and equal bilaterally.  Respiratory:  Respirations regular and unlabored. Rhonchi bilateral lower lobes. No crackles, wheeze, no diminished breath sounds.  GI: Soft, nontender, nondistended, BS + x 4. MS: No deformity or atrophy. Skin: Warm and dry, no rash. Small area ecchymosis on R and L hand approx 1"x1". Neuro:  Strength and sensation are intact. Psych: Normal affect.   Assessment & Plan    1. PE - Noted on CT 11/26/18 "Incidental note is made of small  peripheral pulmonary emboli in upper and lower lobe branches of the right pulmonary artery". 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 No signs of DVT on exam - no edema, erythema.   Does report onset of severe cough 1.5 weeks ago with sore throat but without fever nor SOB, seen by PCP, recommended for symptom management. Cough has improved, still with some productive cough, mild rhonchi on exam. Recommend continued fluids and PRN Muccinex.   Initiated anticoagulation with Eliquis 5mg  BID. 14 days samples provided. Prior authorization will be sent for Eliquis. Prefer Eliquis due to ability to adjust dose for age, renal function, body habitus if needed. Educated that he will remain on therapy for minimum 3 months.   I will call in 1 week to update on prior authorization and check on any potential bruising with Plavix and Eliquis.  Educated to report blood in urine, blood in stool. Educated to present to ED for fall with injury to head. Educated to monitor bruising.   2. Long term current use of anticoagulation - Secondary to PE. He is agreeable to continue Plavix with Eliquis. I will call in 1 week to address any concerns. He and his wife note some bruising with Plavix. If this is worsened and bothersome with Eliquis, will consider stopping Plavix, but he is agreeable to do a trial of both.  CBC today.   He has upcoming removal of cancerous spot on skin tomorrow, confirmed with his dermatologist Dr. Marlyn Corporal office that he is fine to remain on Eliquis for this procedure.  3. CAD - Known severe stenosis LCx, mod stenosis of RCA and LAD. No anginal symptoms. No indication ischemic evaluation. Continue GDMT beta blocker, plavix, Nexletol, Imdur, PRN nitroglycerin.  4. HTN - Checks at home twice daily readings 125-135/60s. Continue present antihypertensive regimen.   5. HLD- LDL goal <70 in setting CAD. Previously referred to lipid clinic.  Seen 10/09/2018.  He is  intolerant of statins, Zetia.  Does not wish to pursue injectable PCSK9 I.  He has been approved for patient assistance for Nexletol.   6. Thoracic aortic aneurysm - Stable on CT 11/26/18. Discussed importance of BP control. Recheck in 1 year.  Disposition: Follow up in 3 month(s) with Dr. Earney Navy, NP 11/28/2018, 1:27 PM

## 2018-11-29 DIAGNOSIS — C44629 Squamous cell carcinoma of skin of left upper limb, including shoulder: Secondary | ICD-10-CM | POA: Diagnosis not present

## 2018-11-29 LAB — CBC
Hematocrit: 44 % (ref 37.5–51.0)
Hemoglobin: 14.5 g/dL (ref 13.0–17.7)
MCH: 30.5 pg (ref 26.6–33.0)
MCHC: 33 g/dL (ref 31.5–35.7)
MCV: 92 fL (ref 79–97)
Platelets: 294 10*3/uL (ref 150–450)
RBC: 4.76 x10E6/uL (ref 4.14–5.80)
RDW: 13.2 % (ref 11.6–15.4)
WBC: 6.7 10*3/uL (ref 3.4–10.8)

## 2018-12-02 ENCOUNTER — Telehealth: Payer: Self-pay | Admitting: Cardiology

## 2018-12-02 MED ORDER — RIVAROXABAN 20 MG PO TABS: 20.0000 mg | ORAL_TABLET | Freq: Every day | ORAL | 2 refills | Status: DC

## 2018-12-02 NOTE — Telephone Encounter (Signed)
Spoke with [place] per DPR. Developed red, itchy rash to chest over the weekend. Only recent change is addition of Eliquis.   As prior auth for Eliquis has been denied by insurance we will STOP Eliquis and START Xarelto 20mg  daily.   Verbalized understanding to stop Eliquis and start Xarelto with evening meal. Rx sent to pharmacy.   Loel Dubonnet, NP

## 2018-12-02 NOTE — Addendum Note (Signed)
Addended by: Loel Dubonnet on: 12/02/2018 12:35 PM   Modules accepted: Orders

## 2018-12-02 NOTE — Telephone Encounter (Signed)
Patient wife called and he is having reaction to Eliquis and itching and rash from it, Please call her.Chad Byrd

## 2018-12-04 DIAGNOSIS — Z23 Encounter for immunization: Secondary | ICD-10-CM | POA: Diagnosis not present

## 2018-12-09 ENCOUNTER — Telehealth: Payer: Self-pay | Admitting: Family

## 2018-12-09 NOTE — Telephone Encounter (Signed)
Received call from [employer] per DPR. Mr. Mcmahen had a rash that was presumed reaction to Eliquis. Was switched to Xarelto. However, rash recurred last night. Agree with [name] that this is unlikely due to medication. She requested recommendation of an allergist. Recommended [facility] in [place]. Did let her know they may require a referral from her PCP. All questions answered and she was appreciative of the help.  Loel Dubonnet, NP

## 2018-12-13 ENCOUNTER — Telehealth: Payer: Self-pay | Admitting: Pharmacist

## 2018-12-13 ENCOUNTER — Telehealth: Payer: Self-pay | Admitting: Family

## 2018-12-13 NOTE — Telephone Encounter (Signed)
Spoke with Chad Byrd per DPR who states the patient's rash is not improving. Urban Gibson, NP has spoken with Chad Byrd regarding this issue on a couple of different occasions (12/02/2018, 12/09/2018). Patient has been scheduled for an appointment with an allergist on Monday, 12/16/2018, for further evaluation. Advised her to keep that appointment as scheduled and to contact our office with any further questions or concerns afterwards. Chad Byrd verbalized understanding and is agreeable.

## 2018-12-13 NOTE — Telephone Encounter (Signed)
[name] returned call to clinic - was asking for information about how to find list of inactive ingredients in his medications. Advised her to look at pamphlet pt is provided from pharmacy as this should list both active and inactive ingredients and the inactive ingredients can differ depending on the manufacturer the retail pharmacy is using. States pt's rash came back today after he stopped Benadryl last night, has f/u appt with a dermatologist on Monday. She verbalized understanding and had no further questions.

## 2018-12-13 NOTE — Telephone Encounter (Signed)
Returned call to [employer] and left message.

## 2018-12-13 NOTE — Telephone Encounter (Signed)
New Message  Pt c/o medication issue:  1. Name of Medication: 7 meds  2. How are you currently taking this medication (dosage and times per day)? Switched from morning to evening  3. Are you having a reaction (difficulty breathing--STAT)? No   4. What is your medication issue? [name] Coulter called stated that the pt is having an allergic reaction. States that torso is breaking out in red blocks and itching and has runny nose. States that she has tried many things including washing soap.  [name] asked to speak with pharmacist [place]  Please call

## 2018-12-13 NOTE — Telephone Encounter (Signed)
Please call patent regarding the meds that may be causing an allergic reaction.. Please call her

## 2018-12-16 ENCOUNTER — Encounter: Payer: Self-pay | Admitting: Allergy and Immunology

## 2018-12-16 ENCOUNTER — Ambulatory Visit (INDEPENDENT_AMBULATORY_CARE_PROVIDER_SITE_OTHER): Payer: Medicare Other | Admitting: Allergy and Immunology

## 2018-12-16 ENCOUNTER — Other Ambulatory Visit: Payer: Self-pay

## 2018-12-16 VITALS — BP 122/70 | HR 90 | Temp 97.9°F | Resp 17 | Ht 67.5 in | Wt 192.8 lb

## 2018-12-16 DIAGNOSIS — C61 Malignant neoplasm of prostate: Secondary | ICD-10-CM | POA: Diagnosis not present

## 2018-12-16 DIAGNOSIS — L299 Pruritus, unspecified: Secondary | ICD-10-CM

## 2018-12-16 DIAGNOSIS — L308 Other specified dermatitis: Secondary | ICD-10-CM | POA: Diagnosis not present

## 2018-12-16 DIAGNOSIS — D6859 Other primary thrombophilia: Secondary | ICD-10-CM | POA: Diagnosis not present

## 2018-12-16 DIAGNOSIS — I2510 Atherosclerotic heart disease of native coronary artery without angina pectoris: Secondary | ICD-10-CM

## 2018-12-16 DIAGNOSIS — I2694 Multiple subsegmental pulmonary emboli without acute cor pulmonale: Secondary | ICD-10-CM

## 2018-12-16 DIAGNOSIS — L989 Disorder of the skin and subcutaneous tissue, unspecified: Secondary | ICD-10-CM

## 2018-12-16 NOTE — Progress Notes (Signed)
[place] [facility] [place] [facility] [place] [facility] [place] [facility] [place]   Dear Dr. Nyoka Cowden,  Thank you for referring Chad Byrd to the [facility] on 12/16/2018.   Below is a summation of this patient's evaluation and recommendations.  Thank you for your referral. I will keep you informed about this patient's response to treatment.   If you have any questions please do not hesitate to contact me.   Sincerely,  Jiles Prows, MD Allergy / Immunology [place]   ______________________________________________________________________    NEW PATIENT NOTE  Referring Provider: Grayland Jack, MD Primary Provider: Elenore Paddy, NP Date of office visit: 12/16/2018    Subjective:   Chief Complaint:  Chad Byrd (DOB: Jun 06, 1928) is a 85 y.o. male who presents to the clinic on 12/16/2018 with a chief complaint of No chief complaint on file. [name]     HPI: Chad Byrd presents to this clinic in evaluation of a itchy dermatitis.  Apparently Chad Byrd developed intermittent itching episodes over the course of the past year that has been progressive in intensity and frequency since that point in time.  Apparently he ended up in the emergency room with a "allergic reaction" manifested as diffuse redness across his entire body with intense itching requiring the administration of prednisone to start this [place] in August 2019.  He did well for a period in time but then redeveloped intermittent itching and over the course of the past month he has persistent itching that is disturbing his sleep.  He will take a Benadryl and he does appear to respond somewhat to this agent but it is very transient and short-lived regarding response.  He does note his skin gets very red especially on his anterior chest with these episodes.  He has no associated systemic or constitutional symptoms.  There is no obvious trigger giving rise to  this issue.  There does not appear to be the introduction of a new medication that occurred in the summer or fall 2019 at which point in time this itchy issue started.  He did have identification of pulmonary embolus 1 month ago with multiple pulmonary emboli seen on a CTA of heart and investigation of a aortic aneurysm.  He was placed on Eliquis but developed intense itching when using the Eliquis and subsequently was changed to Xarelto which he is currently using.  This early spring he was placed on Plavix for a TIA.  He has a history of prostatic enlargement and he may have been told over a decade ago that he had cancer of his prostate.  He needs to self catheterize as his prostate is very large and he is taking finasteride which he started in February 2020 and he utilizes self-catheterization for bladder voiding as he may have some degree of neurogenic bladder.  He has his PSA checked every 6 months by his urologist.  Past Medical History:  Diagnosis Date   CAD (coronary artery disease)    Cataract    Hyperlipidemia    Hypertension     Past Surgical History:  Procedure Laterality Date   HERNIA REPAIR  1982   RECTAL SURGERY  mid 1990s    Allergies as of 12/16/2018      Reactions   Atorvastatin Other (See Comments)   Myalgia and causes extreme drowsiness   Pravastatin Itching   Simvastatin Other (See Comments)   Myalgia and extreme drowsiness   Statins Other (See Comments)  Statins cause muscle aches and extreme drowsiness   Eliquis [apixaban] Rash      Medication List      Bempedoic Acid 180 MG Tabs Commonly known as: Nexletol Take 1 tablet by mouth daily.   clopidogrel 75 MG tablet Commonly known as: PLAVIX TAKE 1 TABLET(75 MG) BY MOUTH DAILY   EPINEPHrine 0.3 mg/0.3 mL Soaj injection Commonly known as: EPI-PEN Inject 0.3 mg into the muscle as needed for anaphylaxis.   finasteride 5 MG tablet Commonly known as: PROSCAR Take 5 mg by mouth daily.     isosorbide mononitrate 30 MG 24 hr tablet Commonly known as: IMDUR TAKE 1 TABLET BY MOUTH ONCE DAILY   lisinopril 5 MG tablet Commonly known as: ZESTRIL TAKE 1 TABLET BY MOUTH EVERY DAY   metoprolol succinate 25 MG 24 hr tablet Commonly known as: TOPROL-XL TAKE 1 TABLET BY MOUTH ONCE DAILY   Multi-Vitamin tablet Take by mouth.   nitroGLYCERIN 0.4 MG SL tablet Commonly known as: NITROSTAT Place 0.4 mg under the tongue every 5 (five) minutes as needed for chest pain.   rivaroxaban 20 MG Tabs tablet Commonly known as: Xarelto Take 1 tablet (20 mg total) by mouth daily with supper.       Review of systems negative except as noted in HPI / PMHx or noted below:  Review of Systems  Constitutional: Negative.   HENT: Negative.   Eyes: Negative.   Respiratory: Negative.   Cardiovascular: Negative.   Gastrointestinal: Negative.   Genitourinary: Negative.   Musculoskeletal: Negative.   Skin: Negative.   Neurological: Negative.   Endo/Heme/Allergies: Negative.   Psychiatric/Behavioral: Negative.     Family History  Problem Relation Age of Onset   Alzheimer's disease Brother    Lung cancer Sister     Social History   Socioeconomic History   Marital status: Divorced    Spouse name: Chad Byrd = sign. other   Number of children: 6   Years of education: 12   Highest education level: Not on file  Occupational History   Occupation: retired/farmer/border Special educational needs teacher strain: Not on file   Food insecurity    Worry: Not on file    Inability: Not on file   Transportation needs    Medical: Not on file    Non-medical: Not on file  Tobacco Use   Smoking status: Never Smoker   Smokeless tobacco: Never Used  Substance and Sexual Activity   Alcohol use: Yes    Comment: 1-2 oz of Scotch daily   Drug use: No   Sexual activity: Not on file  Lifestyle   Physical activity    Days per week: Not on file    Minutes per session: Not  on file   Stress: Not on file  Relationships   Social connections    Talks on phone: Not on file    Gets together: Not on file    Attends religious service: Not on file    Active member of club or organization: Not on file    Attends meetings of clubs or organizations: Not on file    Relationship status: Not on file   Intimate partner violence    Fear of current or ex partner: Not on file    Emotionally abused: Not on file    Physically abused: Not on file    Forced sexual activity: Not on file  Other Topics Concern   Not on file  Social History Narrative  One story house    exercise - walk dogs daily, work on farm      Patent is right-handed. He lives with his girlfriend of 9 years, Chad Byrd.    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    Environmental and Social history  Lives in a house with a dry environment, a dog located inside the household, no carpet in the bedroom, no plastic on the bed, no plastic on the pillow, no smoking inside the household.  Objective:   Vitals:   12/16/18 1355  BP: 122/70  Pulse: 90  Resp: 17  Temp: 97.9 F (36.6 C)  SpO2: 94%   Height: 5' 7.5" (171.5 cm) Weight: 192 lb 12.8 oz (87.5 kg)  Physical Exam Constitutional:      Appearance: He is not diaphoretic.  HENT:     Head: Normocephalic.     Right Ear: Tympanic membrane, ear canal and external ear normal.     Left Ear: Tympanic membrane, ear canal and external ear normal.     Nose: Nose normal. No mucosal edema or rhinorrhea.     Mouth/Throat:     Pharynx: Uvula midline. No oropharyngeal exudate.  Eyes:     Conjunctiva/sclera: Conjunctivae normal.  Neck:     Thyroid: No thyromegaly.     Trachea: Trachea normal. No tracheal tenderness or tracheal deviation.  Cardiovascular:     Rate and Rhythm: Normal rate and regular rhythm.     Heart sounds: Normal heart sounds, S1 normal and S2 normal. No murmur.  Pulmonary:     Effort: No respiratory distress.     Breath sounds:  Normal breath sounds. No stridor. No wheezing or rales.  Lymphadenopathy:     Head:     Right side of head: No tonsillar adenopathy.     Left side of head: No tonsillar adenopathy.     Cervical: No cervical adenopathy.  Skin:    Findings: Rash (Slight blanching erythema anterior chest wall.  Multiple telangiectasias.) present. No erythema.     Nails: There is no clubbing.   Neurological:     Mental Status: He is alert.     Diagnostics: Allergy skin tests were not performed.   Results of blood tests obtained 28 November 2018 identifies WBC 6.7, hemoglobin 14.5, platelet 294.  Results of blood tests obtained 09 August 2018 identifies creatinine 1.32 NG/DL, AST 20 9U/L, ALT 20 4U/L.  Results of blood tests obtained 23 May 2018 identifies WBC 5.5, absolute eosinophil 0, absolute lymphocyte 1200, hemoglobin 17.2, platelet 171, PT 14.5, PTT 29 INR 1.1  Results of a CT angio chest obtained 28 November 2018 identified the following:  Cardiovascular: Atherosclerosis of thoracic aorta is noted without dissection. Grossly stable 4.3 cm ascending thoracic aortic aneurysm is noted. Transverse aortic arch measures 2.7 cm. Proximal descending thoracic aorta measures 3.3 cm. Great vessels are widely patent. Normal cardiac size. No pericardial effusion. Mild coronary artery calcifications are noted. Incidental note is made of small peripheral pulmonary embolus in a lower lobe branch of the right pulmonary artery, as well as a smaller embolus seen in upper lobe branch of right pulmonary artery.  Mediastinum/Nodes: Thyroid gland is unremarkable. The esophagus is unremarkable. Calcified pretracheal and subcarinal adenopathy is noted most consistent with prior granulomatous disease.  Lungs/Pleura: Lungs are clear. No pleural effusion or pneumothorax.   Assessment and Plan:    1. Pruritic disorder   2. Inflammatory dermatosis   3. Hypercoagulable state ([place])   4. Multiple subsegmental  pulmonary  emboli without acute cor pulmonale (HCC)   5. Prostate cancer ([place])     1.  Cetirizine 10 mg - 1-2 tablets 1-2 times per day (max =40 mg)  2.  Prednisone 10 mg -1 tablet daily x10 days, then 1/2 tablet daily x10 days  3.  Can add Benadryl if needed  4.  Blood - CBC w/diff, TSH, FT4, TP, CMP  5.  Return in 20 days or earlier if problem.  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  Given his recent identification of pulmonary embolus in the context of indolent prostate cancer his prostate status may have changed and he may have a more active form of prostate cancer at this point in time giving rise to both hypercoagulable state and his pruritic disorder.  We will start investigation with some screening blood tests as noted above.  He will have his PSA checked by his urologist ina a few weeks.  I have given him a low-dose of systemic steroids as he is intensely pruritic and we will have him utilize a dose of cetirizine up to 40 mg daily to attempt to address his pruritus.  I will see him back in this clinic at the end of his prednisone course or earlier if there is a problem.  Pending his response we will consider further evaluation and treatment.  He may need more thorough investigation for a systemic disease such as a flushing disorder, neoplastic disease, or another form of immunological hyperreactivity possibly directed at one of his medications.  Jiles Prows, MD Allergy / Immunology [facility]

## 2018-12-16 NOTE — Patient Instructions (Addendum)
  1.  Cetirizine 10 mg - 1-2 tablets 1-2 times per day (max =40 mg)  2.  Prednisone 10 mg -1 tablet daily x10 days, then 1/2 tablet daily x10 days  3.  Can add Benadryl if needed  4.  Blood - CBC w/diff, TSH, FT4, TP, CMP  5.  Return in 20 days or earlier if problem.

## 2018-12-17 ENCOUNTER — Encounter: Payer: Self-pay | Admitting: Allergy and Immunology

## 2018-12-18 ENCOUNTER — Telehealth: Payer: Self-pay | Admitting: Cardiology

## 2018-12-18 ENCOUNTER — Telehealth: Payer: Self-pay | Admitting: Allergy and Immunology

## 2018-12-18 LAB — COMPREHENSIVE METABOLIC PANEL
ALT: 25 IU/L (ref 0–44)
AST: 32 IU/L (ref 0–40)
Albumin/Globulin Ratio: 2 (ref 1.2–2.2)
Albumin: 4.3 g/dL (ref 3.6–4.6)
Alkaline Phosphatase: 45 IU/L (ref 39–117)
BUN/Creatinine Ratio: 19 (ref 10–24)
BUN: 28 mg/dL — ABNORMAL HIGH (ref 8–27)
Bilirubin Total: 0.5 mg/dL (ref 0.0–1.2)
CO2: 30 mmol/L — ABNORMAL HIGH (ref 20–29)
Calcium: 9.7 mg/dL (ref 8.6–10.2)
Chloride: 103 mmol/L (ref 96–106)
Creatinine, Ser: 1.48 mg/dL — ABNORMAL HIGH (ref 0.76–1.27)
GFR calc Af Amer: 48 mL/min/{1.73_m2} — ABNORMAL LOW (ref >59)
GFR calc non Af Amer: 42 mL/min/{1.73_m2} — ABNORMAL LOW (ref >59)
Globulin, Total: 2.1 g/dL (ref 1.5–4.5)
Glucose: 93 mg/dL (ref 65–99)
Potassium: 4.3 mmol/L (ref 3.5–5.2)
Sodium: 141 mmol/L (ref 134–144)
Total Protein: 6.4 g/dL (ref 6.0–8.5)

## 2018-12-18 LAB — CBC WITH DIFFERENTIAL/PLATELET
Basophils Absolute: 0.1 10*3/uL (ref 0.0–0.2)
Basos: 1 %
EOS (ABSOLUTE): 0.1 10*3/uL (ref 0.0–0.4)
Eos: 2 %
Hematocrit: 43.5 % (ref 37.5–51.0)
Hemoglobin: 14.6 g/dL (ref 13.0–17.7)
Lymphocytes Absolute: 1.3 10*3/uL (ref 0.7–3.1)
Lymphs: 17 %
MCH: 30.9 pg (ref 26.6–33.0)
MCHC: 33.6 g/dL (ref 31.5–35.7)
MCV: 92 fL (ref 79–97)
Monocytes Absolute: 0.8 10*3/uL (ref 0.1–0.9)
Monocytes: 11 %
Neutrophils Absolute: 5 10*3/uL (ref 1.4–7.0)
Neutrophils: 69 %
Platelets: 222 10*3/uL (ref 150–450)
RBC: 4.73 x10E6/uL (ref 4.14–5.80)
RDW: 13.6 % (ref 11.6–15.4)
WBC: 7.2 10*3/uL (ref 3.4–10.8)

## 2018-12-18 LAB — THYROID PEROXIDASE ANTIBODY: Thyroperoxidase Ab SerPl-aCnc: <9 IU/mL (ref 0–34)

## 2018-12-18 LAB — TSH+FREE T4
Free T4: 1.32 ng/dL (ref 0.82–1.77)
TSH: 2.22 u[IU]/mL (ref 0.450–4.500)

## 2018-12-18 NOTE — Telephone Encounter (Signed)
Please inform patient/wife that blood is definitely coming from his urine.  The issue is we are is the source of that blood.  Is it from his bladder or is it from his kidneys?  She will need to contact the urologist today for more direction concerning this issue.

## 2018-12-18 NOTE — Telephone Encounter (Signed)
Spoke with patient's wife. She stated she understood and would contact his urologist.

## 2018-12-18 NOTE — Telephone Encounter (Signed)
Returned call to [employer]. Reports that repeat cath this afternoon was  "a little better than this this morning" but still bloody. Have recommended hold Xarelto x48 hours. If no blood in urine Friday, may resume Xarelto. If blood in urine persists, he will call his urologist to make a sooner appointment than the one presently scheduled for 01/01/19.   If bleeding persists and he has to call urology I have informed [name] that he may remain off the Xarelto until seen by urology.  Loel Dubonnet, NP

## 2018-12-18 NOTE — Telephone Encounter (Signed)
Mrs. Chad Byrd says that Mr. Rossen has had some blood in his urine since he started on the new medicine. She is requesting a call back.

## 2018-12-18 NOTE — Telephone Encounter (Signed)
Dr. Kozlow, Please advise.  

## 2018-12-18 NOTE — Telephone Encounter (Signed)
I would hold his anticoagulants somewhere between 24 to 48 hours to ensure it clears.  If he does not then he needs to go and see a urologist and might need to be scoped in the office.  I think were safe doing this since his pulmonary embolism was found incidentally.

## 2018-12-18 NOTE — Telephone Encounter (Signed)
PT wife called in states pt started prednisone and zyrtec on Tuesday and this morning his urine is bright red. PT and wife concerned this is blood in his urine. PT has catheter

## 2018-12-18 NOTE — Telephone Encounter (Signed)
Mr. Boldt routinely self-caths 4 times per day. Reports this morning urine was red in color. No resistance, no difficulty with self-cath. His next time for self-cath is 12p-1p. (Routine schedule cath 6:30-7:30a, 12p-1p, 6p-7p, 11p-12a). He takes Xarelto 20mg  daily with supper. Denies additional bleeding.  I am going to call back this afternoon to find out color of urine with second cath. Recommendation? Unsure if he needs to hold Xarelto for 2 days or see his PCP/urologist. Thank you.  Loel Dubonnet, NP

## 2018-12-31 ENCOUNTER — Other Ambulatory Visit: Payer: Self-pay | Admitting: Internal Medicine

## 2018-12-31 ENCOUNTER — Telehealth: Payer: Self-pay | Admitting: Family

## 2018-12-31 NOTE — Telephone Encounter (Signed)
Please call regarding referral to rheumatology

## 2018-12-31 NOTE — Telephone Encounter (Signed)
Spoke with [employer] per [employer]. Chad Byrd has been recommended to see rheumatology. Chad Byrd plans to talk to primary care, but wanted to know if there was a rheumatologist in the [facility] system. Gave her the name and phone number of [facility]. Chad Byrd understands she likely will need to contact Chad Byrd's PCP to coordinate the referral and is agreeable.   Loel Dubonnet, NP

## 2019-01-01 ENCOUNTER — Other Ambulatory Visit: Payer: Self-pay | Admitting: Internal Medicine

## 2019-01-01 DIAGNOSIS — C61 Malignant neoplasm of prostate: Secondary | ICD-10-CM | POA: Diagnosis not present

## 2019-01-06 ENCOUNTER — Ambulatory Visit: Payer: Medicare Other | Admitting: Allergy and Immunology

## 2019-01-08 DIAGNOSIS — N312 Flaccid neuropathic bladder, not elsewhere classified: Secondary | ICD-10-CM | POA: Diagnosis not present

## 2019-01-08 DIAGNOSIS — C61 Malignant neoplasm of prostate: Secondary | ICD-10-CM | POA: Diagnosis not present

## 2019-01-08 DIAGNOSIS — R8271 Bacteriuria: Secondary | ICD-10-CM | POA: Diagnosis not present

## 2019-01-18 ENCOUNTER — Other Ambulatory Visit: Payer: Self-pay | Admitting: Neurology

## 2019-01-25 ENCOUNTER — Other Ambulatory Visit: Payer: Self-pay | Admitting: Internal Medicine

## 2019-02-04 NOTE — Progress Notes (Signed)
Cardiology Office Note:    Date:  02/05/2019   ID:  Chad Byrd, DOB 1928/03/21, MRN [id number]  PCP:  Elenore Paddy, NP  Cardiologist:  Shirlee More, MD    Referring MD: Elenore Paddy, NP    ASSESSMENT:    1. Acute pulmonary embolism, unspecified pulmonary embolism type, unspecified whether acute cor pulmonale present ([place])   2. Current use of long term anticoagulation   3. Essential hypertension   4. Coronary artery disease involving native coronary artery of native heart without angina pectoris   5. Mixed hyperlipidemia   6. Pruritus    PLAN:    In order of problems listed above:  1. Resume anticoagulation combined with low-dose aspirin as antithrombotic therapy with unprovoked pulmonary embolism CAD and PAD. 2. Blood pressure target stop ACE inhibitor with pruritus if needed we could use ARB or a vasodilator like hydralazine 3. Stable CAD New York Heart Association class I continue medical therapy including lipid-lowering beta-blocker oral nitrate 4. Continue bempedoic acid check liver function lipid profile 5. Both clopidogrel and lisinopril were discontinued. Plan  Next appointment: 6 months   Medication Adjustments/Labs and Tests Ordered: Current medicines are reviewed at length with the patient today.  Concerns regarding medicines are outlined above.  Orders Placed This Encounter  Procedures  . Lipid panel  . Comp Met (CMET)   Meds ordered this encounter  Medications  . rivaroxaban (XARELTO) 20 MG TABS tablet    Sig: Take 1 tablet (20 mg total) by mouth daily with supper.    Dispense:  30 tablet    Refill:  11  . aspirin EC 81 MG tablet    Sig: Take 1 tablet (81 mg total) by mouth daily.    Dispense:  90 tablet    Refill:  3    Chief Complaint  Patient presents with  . Coronary Artery Disease  . Hyperlipidemia  . Anticoagulation  . PAD    History of Present Illness:    Chad Byrd is a 86 y.o. 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  He underwent coronary angiography December 2015 showing severe proximal left circumflex stenosis felt to be  high risk for intervention and was treated medically.  He underwent a an echocardiogram 10/15/2017 showing an ejection fraction of 50-55 percent there is no segmental LV dysfunction and  mild to moderate aortic regurgitation.  He was seen by me 04/08/2018 when he presented with a focal TIA and was referred to [facility] ED for stroke evaluation.  He did not sustain a stroke and subsequently followed up with neurology on 04/11/2088.  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   He was seen 05/20/2018 and declined lipid-lowering treatment with PCSK9 due to cost.  He was last seen 08/09/2018.  He was seen after incidental finding of PE on CTA chest 11/28/2018 and initiated on apixaban.  Compliance with diet, lifestyle and medications: No he stopped his anticoagulant  His biggest problem is pruritus he has been seen by urology family doctor dermatology and allergy and despite steroids and antihistamine still itches.  He takes clopidogrel which may be the culprit and we negotiated taking baby aspirin and anticoagulant as antithrombotic therapy he also takes an ACE inhibitor will discontinue follow home blood pressures he remains consistently greater than 140 substitute an ARB or hydralazine.  From  cardiac perspective he is done well he has had no palpitation edema chest pain or syncope.  He tolerates bempedoic acid and will have a lipid profile and CMP today he has had no recurrent TIA Past Medical History:  Diagnosis Date  . CAD (coronary artery disease)   . Cataract   . Hyperlipidemia   . Hypertension     Past Surgical History:  Procedure Laterality Date  . HERNIA REPAIR  1982  . RECTAL SURGERY  mid 1990s    Current  Medications: Current Meds  Medication Sig  . Bempedoic Acid (NEXLETOL) 180 MG TABS Take 1 tablet by mouth daily.  [name] EPINEPHrine 0.3 mg/0.3 mL IJ SOAJ injection Inject 0.3 mg into the muscle as needed for anaphylaxis.   . finasteride (PROSCAR) 5 MG tablet Take 5 mg by mouth daily.   . isosorbide mononitrate (IMDUR) 30 MG 24 hr tablet TAKE 1 TABLET BY MOUTH EVERY DAY  . metoprolol succinate (TOPROL-XL) 25 MG 24 hr tablet TAKE 1 TABLET BY MOUTH EVERY DAY  . Multiple Vitamin (MULTI-VITAMIN) tablet Take by mouth.  . nitroGLYCERIN (NITROSTAT) 0.4 MG SL tablet Place 0.4 mg under the tongue every 5 (five) minutes as needed for chest pain.  . [DISCONTINUED] clopidogrel (PLAVIX) 75 MG tablet TAKE 1 TABLET(75 MG) BY MOUTH DAILY  . [DISCONTINUED] lisinopril (ZESTRIL) 5 MG tablet TAKE 1 TABLET BY MOUTH EVERY DAY  . [DISCONTINUED] rivaroxaban (XARELTO) 20 MG TABS tablet Take 1 tablet (20 mg total) by mouth daily with supper.     Allergies:   Atorvastatin, Pravastatin, Simvastatin, Statins, and Eliquis [apixaban]   Social History   Socioeconomic History  . Marital status: Divorced    Spouse name: [name] = sign. other  . Number of children: 6  . Years of education: 8  . Highest education level: Not on file  Occupational History  . 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 Smoking status: Never Smoker  . Smokeless tobacco: Never Used  Substance and Sexual Activity  . Alcohol use: Yes    Comment: 1-2 oz of Scotch daily  . Drug use: No  . Sexual activity: Not on file  Other Topics Concern  . Not on file  Social History Narrative   One story house    exercise - walk dogs daily, work on farm      Patent is right-handed. He lives with his girlfriend of 67 years, Chad Byrd.    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   Social Determinants of Health   Financial Resource Strain:   . Difficulty of Paying Living Expenses: Not on file  Food Insecurity:   . Worried About Paediatric nurse in the Last Year: Not on file  . Ran Out of Food in the Last Year: Not on file  Transportation Needs:   . Lack of Transportation (Medical): Not on file  . Lack of Transportation (Non-Medical): Not on file  Physical Activity:   . Days of Exercise per Week: Not on file  . Minutes of Exercise per Session: Not on file  Stress:   . Feeling of Stress : Not on file  Social Connections:   . Frequency of Communication with Friends and Family: Not on file  . Frequency of Social Gatherings with Friends and Family: Not on file  . Attends Religious Services: Not on file  . Active Member of Clubs or Organizations: Not on file  . Attends Archivist Meetings: Not on file  . Marital  Status: Not on file     Family History: The patient's family history includes Alzheimer's disease in his brother; Lung cancer in his sister. ROS:   Please see the history of present illness.    All other systems reviewed and are negative.  EKGs/Labs/Other Studies Reviewed:    The following studies were reviewed today:   Recent Labs: 12/16/2018: ALT 25; BUN 28; Creatinine, Ser 1.48; Hemoglobin 14.6; Platelets 222; Potassium 4.3; Sodium 141; TSH 2.220  Recent Lipid Panel    Component Value Date/Time   CHOL 172 08/09/2018 1146   CHOL 203 (H) 09/29/2015 1039   TRIG 148 08/09/2018 1146   TRIG 127 09/29/2015 1039   HDL 38 (L) 08/09/2018 1146   HDL 36 (L) 09/29/2015 1039   CHOLHDL 4.4 02/20/2018 0859   CHOLHDL 4.0 01/11/2017 0444   VLDL 30 01/11/2017 0444   LDLCALC 104 (H) 08/09/2018 1146   LDLCALC 142 (H) 09/29/2015 1039    Physical Exam:    VS:  BP 122/60   Pulse 86   Ht 5' 7.5" (1.715 m)   Wt 197 lb (89.4 kg)   SpO2 91%   BMI 30.40 kg/m     Wt Readings from Last 3 Encounters:  02/05/19 197 lb (89.4 kg)  12/16/18 192 lb 12.8 oz (87.5 kg)  11/28/18 191 lb (86.6 kg)     GEN:  Well nourished, well developed in no acute distress HEENT: Normal NECK: No JVD; No carotid  bruits LYMPHATICS: No lymphadenopathy CARDIAC: RRR, no murmurs, rubs, gallops RESPIRATORY:  Clear to auscultation without rales, wheezing or rhonchi  ABDOMEN: Soft, non-tender, non-distended MUSCULOSKELETAL:  No edema; No deformity  SKIN: Warm and dry NEUROLOGIC:  Alert and oriented x 3 PSYCHIATRIC:  Normal affect    Signed, Shirlee More, MD  02/05/2019 2:49 PM    [facility]

## 2019-02-05 ENCOUNTER — Ambulatory Visit (INDEPENDENT_AMBULATORY_CARE_PROVIDER_SITE_OTHER): Payer: Medicare Other | Admitting: Cardiology

## 2019-02-05 ENCOUNTER — Encounter: Payer: Self-pay | Admitting: Cardiology

## 2019-02-05 ENCOUNTER — Other Ambulatory Visit: Payer: Self-pay

## 2019-02-05 VITALS — BP 122/60 | HR 86 | Ht 67.5 in | Wt 197.0 lb

## 2019-02-05 DIAGNOSIS — I10 Essential (primary) hypertension: Secondary | ICD-10-CM

## 2019-02-05 DIAGNOSIS — I2699 Other pulmonary embolism without acute cor pulmonale: Secondary | ICD-10-CM

## 2019-02-05 DIAGNOSIS — Z7901 Long term (current) use of anticoagulants: Secondary | ICD-10-CM | POA: Diagnosis not present

## 2019-02-05 DIAGNOSIS — I2510 Atherosclerotic heart disease of native coronary artery without angina pectoris: Secondary | ICD-10-CM | POA: Diagnosis not present

## 2019-02-05 DIAGNOSIS — E782 Mixed hyperlipidemia: Secondary | ICD-10-CM | POA: Diagnosis not present

## 2019-02-05 DIAGNOSIS — L299 Pruritus, unspecified: Secondary | ICD-10-CM | POA: Insufficient documentation

## 2019-02-05 DIAGNOSIS — E7801 Familial hypercholesterolemia: Secondary | ICD-10-CM | POA: Diagnosis not present

## 2019-02-05 HISTORY — DX: Pruritus, unspecified: L29.9

## 2019-02-05 MED ORDER — RIVAROXABAN 20 MG PO TABS: 20.0000 mg | ORAL_TABLET | Freq: Every day | ORAL | 11 refills | Status: DC

## 2019-02-05 MED ORDER — ASPIRIN EC 81 MG PO TBEC: 81.0000 mg | DELAYED_RELEASE_TABLET | Freq: Every day | ORAL | 3 refills | Status: AC

## 2019-02-05 NOTE — Patient Instructions (Addendum)
Medication Instructions:  Your physician has recommended you make the following change in your medication:  1.  STOP Plavis 2.  STOP Lisinopril 3.  START Xarelto 20 mg daily 4.  START Aspirin 81 mg daily  *If you need a refill on your cardiac medications before your next appointment, please call your pharmacy*  Lab Work: TODAY:  CMP & LIPID  If you have labs (blood work) drawn today and your tests are completely normal, you will receive your results only by: [name] MyChart Message (if you have MyChart) OR . A paper copy in the mail If you have any lab test that is abnormal or we need to change your treatment, we will call you to review the results.  Testing/Procedures: None ordered  Follow-Up: At [facility], you and your health needs are our priority.  As part of our continuing mission to provide you with exceptional heart care, we have created designated Provider Care Teams.  These Care Teams include your primary Cardiologist (physician) and Advanced Practice Providers (APPs -  Physician Assistants and Nurse Practitioners) who all work together to provide you with the care you need, when you need it.  Your next appointment:   6 month(s)  The format for your next appointment:   In Person  Provider:   Shirlee More, MD  Other Instructions Monitor your blood pressure at home daily.  Contact the office if it is consistently running over 140 on the top

## 2019-02-06 LAB — COMPREHENSIVE METABOLIC PANEL
ALT: 26 IU/L (ref 0–44)
AST: 34 IU/L (ref 0–40)
Albumin/Globulin Ratio: 2.1 (ref 1.2–2.2)
Albumin: 4.1 g/dL (ref 3.6–4.6)
Alkaline Phosphatase: 46 IU/L (ref 39–117)
BUN/Creatinine Ratio: 14 (ref 10–24)
BUN: 18 mg/dL (ref 8–27)
Bilirubin Total: 0.4 mg/dL (ref 0.0–1.2)
CO2: 26 mmol/L (ref 20–29)
Calcium: 9.5 mg/dL (ref 8.6–10.2)
Chloride: 105 mmol/L (ref 96–106)
Creatinine, Ser: 1.31 mg/dL — ABNORMAL HIGH (ref 0.76–1.27)
GFR calc Af Amer: 56 mL/min/{1.73_m2} — ABNORMAL LOW (ref >59)
GFR calc non Af Amer: 48 mL/min/{1.73_m2} — ABNORMAL LOW (ref >59)
Globulin, Total: 2 g/dL (ref 1.5–4.5)
Glucose: 128 mg/dL — ABNORMAL HIGH (ref 65–99)
Potassium: 4.1 mmol/L (ref 3.5–5.2)
Sodium: 143 mmol/L (ref 134–144)
Total Protein: 6.1 g/dL (ref 6.0–8.5)

## 2019-03-04 LAB — LIPID PANEL W/O CHOL/HDL RATIO
Cholesterol, Total: 153 mg/dL (ref 100–199)
HDL: 35 mg/dL — ABNORMAL LOW
LDL Chol Calc (NIH): 87 mg/dL (ref 0–99)
Triglycerides: 180 mg/dL — ABNORMAL HIGH (ref 0–149)
VLDL Cholesterol Cal: 31 mg/dL (ref 5–40)

## 2019-03-04 LAB — SPECIMEN STATUS REPORT

## 2019-03-06 ENCOUNTER — Telehealth: Payer: Self-pay | Admitting: Pharmacist

## 2019-03-06 NOTE — Telephone Encounter (Signed)
I suspect the prior authorization needs to be renewed. I have submitted. Awaiting response from insurance. Patients wife updated.

## 2019-03-06 NOTE — Telephone Encounter (Signed)
Left detailed message on machine per DPR that nexletol was approved though 01/30/20

## 2019-03-06 NOTE — Telephone Encounter (Signed)
Calling about medication Bempedoic Acid (NEXLETOL) 180 MG TABS, patient went for refill and it was going to cost over $600. Luann stated that there is a grant for a year that is to cover the cost of the medication. She would like a call back.

## 2019-03-12 ENCOUNTER — Encounter: Payer: Self-pay | Admitting: Neurology

## 2019-03-12 ENCOUNTER — Ambulatory Visit: Payer: Medicare Other | Admitting: Neurology

## 2019-03-13 ENCOUNTER — Other Ambulatory Visit: Payer: Self-pay

## 2019-03-13 ENCOUNTER — Telehealth (INDEPENDENT_AMBULATORY_CARE_PROVIDER_SITE_OTHER): Payer: Medicare Other | Admitting: Neurology

## 2019-03-13 DIAGNOSIS — I679 Cerebrovascular disease, unspecified: Secondary | ICD-10-CM

## 2019-03-13 DIAGNOSIS — G459 Transient cerebral ischemic attack, unspecified: Secondary | ICD-10-CM | POA: Diagnosis not present

## 2019-03-13 DIAGNOSIS — R200 Anesthesia of skin: Secondary | ICD-10-CM

## 2019-03-13 NOTE — Progress Notes (Signed)
Due to the COVID-19 crisis, this virtual check-in visit was done via telephone from my office and it was initiated and consent given by this patient and or family.   Telephone (Audio) Visit The purpose of this telephone visit is to provide medical care while limiting exposure to the novel coronavirus.    Consent was obtained for telephone visit and initiated by pt/family:  Yes.   Answered questions that patient had about telehealth interaction:  Yes.   I discussed the limitations, risks, security and privacy concerns of performing an evaluation and management service by telephone. I also discussed with the patient that there may be a patient responsible charge related to this service. The patient expressed understanding and agreed to proceed.  Pt location: Home Physician Location: office Name of referring provider:  Elenore Paddy, NP I connected with .Chad Byrd at patients initiation/request on 03/13/2019 at  9:50 AM EST by telephone and verified that I am speaking with the correct person using two identifiers.  Pt MRN:  [id number] Pt DOB:  [birth year]   History of Present Illness: This is a 86 year-old man with CAD, hypertension, hyperlipidemia, thoracic aneurysm, returning for follow-up of TIA secondary to right M2 stenosis.  Since his last visit in August, he was found to have pulmonary embolism and started on Xeralto and aspirin.  Plavix was discontinued.  For the past several week, he complains of left arm numbness, which occurs upon wakening.  Symptoms are quickly improved by repositioning.  He denies any weakness of the hand.  No similar symptoms on the right hand.  He has had about 2-3 spells of transient left facial numbness.  No left sided weakness.    For the past year, he has been having whole body rash.  He stopped lisinopril and plavix 75mg  which markedly improved his rash.     Assessment and Plan:   1. Left arm numbness.  Less likely TIA, since symptoms occur upon  waking and tend to favor entrapment neuropathy, such as CTS. NCS/EMG of the left arm will be ordered to better evaluate symptoms.  If there is no evidence of peripheral etiology for left arm paresthesias, next he will need cerebral angiogram to reassess his intracranial stenosis.   2. TIA manifesting with episodic left face and arm paresthesias secondary to right M2 stenosis (March 2020).  He reports having 2-3 spells of facial numbness, which is not associated with his left arm numbness since the summer.  He was initially on aspirin + plavix, but due to having PE, plavix has been stopped and on Xeralto + aspirin.  This combination is adequate management for intracranial stenosis and it is reasonable to keep him off plavix, unless his anticoagulation is stopped, in which case plavix will need to be restarted Hyperlipidemia and hypertension is well-controlled on medical therapy   Follow Up Instructions:   I discussed the assessment and treatment plan with the patient. The patient was provided an opportunity to ask questions and all were answered. The patient agreed with the plan and demonstrated an understanding of the instructions.   The patient was advised to call back or seek an in-person evaluation if the symptoms worsen or if the condition fails to improve as anticipated.   Total Time spent in visit with the patient was:  13 min, of which 100% of the time was spent in counseling and/or coordinating care.   Pt understands and agrees with the plan of care outlined.     Chad Encina K  Posey Pronto, DO

## 2019-03-17 ENCOUNTER — Telehealth: Payer: Self-pay | Admitting: Cardiology

## 2019-03-17 NOTE — Telephone Encounter (Signed)
Pt c/o BP issue: STAT if pt c/o blurred vision, one-sided weakness or slurred speech  1. What are your last 5 BP readings? 140/64, 150/63, 172/68, 178/72   2. Are you having any other symptoms (ex. Dizziness, headache, blurred vision, passed out)? Left arm fell asleep and headache when BP was high   3. What is your BP issue? Patient's spouse states 3 times over the past few days the patient's left arm has fallen asleep and his BP had gotten high. She sates he has been okay the last two nights, but is concerned his medication may need to be adjusted. Please advise.

## 2019-03-17 NOTE — Telephone Encounter (Signed)
Left message to call back  

## 2019-03-17 NOTE — Telephone Encounter (Signed)
Spoke with patient's daughter Lanora Manis. She reports patient has had 3 episodes in the last 9 days in which patient has been awoken around 4-5am due to his left arm "being asleep". They check his blood pressure and noted it is running high at that time. Patient has had a slight headache. Patient not having any other symptoms.   Patient's lisinopril was discontinued recently and daughter reports 81mg  of aspirin was started.   Daughter would like patient evaluated. Placed patient on DOD schedule for Tuesday at 9:40am.   Patient to call 911 if symptoms worsen or if weakness or blurred vision occur.

## 2019-03-17 NOTE — Telephone Encounter (Signed)
Thanks

## 2019-03-18 ENCOUNTER — Other Ambulatory Visit: Payer: Self-pay

## 2019-03-18 ENCOUNTER — Encounter: Payer: Self-pay | Admitting: Cardiology

## 2019-03-18 ENCOUNTER — Ambulatory Visit (INDEPENDENT_AMBULATORY_CARE_PROVIDER_SITE_OTHER): Payer: Medicare Other | Admitting: Cardiology

## 2019-03-18 VITALS — BP 130/80 | HR 69 | Ht 67.5 in | Wt 199.0 lb

## 2019-03-18 DIAGNOSIS — M6289 Other specified disorders of muscle: Secondary | ICD-10-CM | POA: Diagnosis not present

## 2019-03-18 DIAGNOSIS — I10 Essential (primary) hypertension: Secondary | ICD-10-CM | POA: Diagnosis not present

## 2019-03-18 DIAGNOSIS — R200 Anesthesia of skin: Secondary | ICD-10-CM

## 2019-03-18 DIAGNOSIS — R5383 Other fatigue: Secondary | ICD-10-CM

## 2019-03-18 NOTE — Progress Notes (Signed)
Cardiology Office Note:    Date:  03/18/2019   ID:  Chad Byrd, DOB [birth year], MRN [id number]  PCP:  Chad Paddy, NP  Cardiologist:  Chad More, MD  Electrophysiologist:  None   Referring MD: Chad Paddy, NP   Chief Complaint  Patient presents with  . Follow-up    To discuss BP    History of Present Illness:    Chad Byrd is a 86 y.o. male with a hx of pulmonary embolism on Xarelto, coronary artery disease, hypertension, hyperlipidemia declined PCSK-9 inhibitors due to cost, TIA in March 2020 he was being followed by neurology he was last seen by Chad Byrd on February 05, 2019 at that time his lisinopril was stopped due to itching in addition clopidogrel was also stopped.  He was continued on the same morning his Xarelto and aspirin.  He presents today after he moved like to be evaluated given the fact that he has been experiencing left arm numbness and  tingling.  His most bothersome symptoms HPV early morning stiffness that he gets previously when he wakes up in the morning he is significantly stiff to move his upper arms laterally until gradually when he continues to do his ADLs then he starts to feel better.  Chest pain, shortness of breath, nausea, vomiting, dizziness or lightheadedness   Past Medical History:  Diagnosis Date  . CAD (coronary artery disease)   . Cataract   . Hyperlipidemia   . Hypertension     Past Surgical History:  Procedure Laterality Date  . HERNIA REPAIR  1982  . RECTAL SURGERY  mid 1990s    Current Medications: Current Meds  Medication Sig  . aspirin EC 81 MG tablet Take 1 tablet (81 mg total) by mouth daily.  . Bempedoic Acid (NEXLETOL) 180 MG TABS Take 1 tablet by mouth daily.  [name] EPINEPHrine 0.3 mg/0.3 mL IJ SOAJ injection Inject 0.3 mg into the muscle as needed for anaphylaxis.   . finasteride (PROSCAR) 5 MG tablet Take 5 mg by mouth daily.   . isosorbide mononitrate (IMDUR) 30 MG 24 hr tablet TAKE 1 TABLET BY MOUTH  EVERY DAY  . metoprolol succinate (TOPROL-XL) 25 MG 24 hr tablet TAKE 1 TABLET BY MOUTH EVERY DAY  . Multiple Vitamin (MULTI-VITAMIN) tablet Take by mouth.  . nitroGLYCERIN (NITROSTAT) 0.4 MG SL tablet Place 0.4 mg under the tongue every 5 (five) minutes as needed for chest pain.  . rivaroxaban (XARELTO) 20 MG TABS tablet Take 1 tablet (20 mg total) by mouth daily with supper.     Allergies:   Atorvastatin, Pravastatin, Simvastatin, Statins, and Eliquis [apixaban]   Social History   Socioeconomic History  . Marital status: Divorced    Spouse name: Chad Byrd = sign. other  . Number of children: 6  . Years of education: 40  . Highest education level: Not on file  Occupational History  . 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 Smoking status: Never Smoker  . Smokeless tobacco: Never Used  Substance and Sexual Activity  . Alcohol use: Yes    Comment: 1-2 oz of Scotch daily  . Drug use: No  . Sexual activity: Not on file  Other Topics Concern  . Not on file  Social History Narrative   One story house    exercise - walk dogs daily, work on farm      Patent is right-handed. He lives with his girlfriend of 61 years, Chad Byrd.    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   Social Determinants of Health   Financial Resource Strain:   . Difficulty of Paying Living Expenses: Not on file  Food Insecurity:   . Worried About Charity fundraiser in the Last Year: Not on file  . Ran Out of Food in the Last Year: Not on file  Transportation Needs:   . Lack of Transportation (Medical): Not on file  . Lack of Transportation (Non-Medical): Not on file  Physical Activity:   . Days of Exercise per Week: Not on file  . Minutes of Exercise per Session: Not on file  Stress:   . Feeling of Stress : Not on file  Social Connections:   . Frequency of Communication with Friends and Family: Not on file  . Frequency of Social Gatherings with Friends and Family: Not on file  .  Attends Religious Services: Not on file  . Active Member of Clubs or Organizations: Not on file  . Attends Archivist Meetings: Not on file  . Marital Status: Not on file     Family History: The patient's family history includes Alzheimer's disease in his brother; Lung cancer in his sister.  ROS:   Review of Systems  Constitution: Negative for decreased appetite, fever and weight gain.  HENT: Negative for congestion, ear discharge, hoarse voice and sore throat.   Eyes: Negative for discharge, redness, vision loss in right eye and visual halos.  Cardiovascular: Negative for chest pain, dyspnea on exertion, leg swelling, orthopnea and palpitations.  Respiratory: Negative for cough, hemoptysis, shortness of breath and snoring.   Endocrine: Negative for heat intolerance and polyphagia.  Hematologic/Lymphatic: Negative for bleeding problem. Does not bruise/bleed easily.  Skin: Negative for flushing, nail changes, rash and suspicious lesions.  Musculoskeletal: Negative for arthritis, joint pain, muscle cramps, myalgias, neck pain and stiffness.  Gastrointestinal: Negative for abdominal pain, bowel incontinence, diarrhea and excessive appetite.  Genitourinary: Negative for decreased libido, genital sores and incomplete emptying.  Neurological: Negative for brief paralysis, focal weakness, headaches and loss of balance.  Psychiatric/Behavioral: Negative for altered mental status, depression and suicidal ideas.  Allergic/Immunologic: Negative for HIV exposure and persistent infections.    EKGs/Labs/Other Studies Reviewed:    The following studies were reviewed today:   EKG: None today  CTA chest IMPRESSION: November 26, 2018 Incidental note is made of small peripheral pulmonary emboli in upper and lower lobe branches of the right pulmonary artery. These results will be called to the ordering clinician or representative by the Radiologist Assistant, and communication documented  in the PACS or zVision Dashboard.  Grossly stable 4.3 cm ascending thoracic aortic aneurysm is noted. Recommend annual imaging followup by CTA or MRA. This recommendation follows 2010 ACCF/AHA/AATS/ACR/ASA/SCA/SCAI/SIR/STS/SVM Guidelines for the Diagnosis and Management of Patients with Thoracic Aortic Disease. Circulation. 2010; [id number]. Aortic aneurysm NOS (ICD10-I71.9).   Mild coronary artery calcifications are noted.  Cholelithiasis.  Aortic Atherosclerosis (ICD10-I70.0).   2D echo December 2018 Study Conclusions   - Left ventricle: The cavity size was normal. Wall thickness was  normal. Systolic function was normal. The estimated ejection  fraction was in the range of 55% to 60%. Doppler parameters are  consistent with abnormal left ventricular relaxation (grade 1  diastolic dysfunction).  - Aortic valve: There was mild regurgitation.   Recent Labs: 12/16/2018: Hemoglobin 14.6; Platelets 222; TSH 2.220 02/05/2019: ALT 26; BUN 18; Creatinine, Ser 1.31; Potassium 4.1; Sodium 143  Recent Lipid Panel    Component Value Date/Time  CHOL 153 02/05/2019 1511   CHOL 203 (H) 09/29/2015 1039   TRIG 180 (H) 02/05/2019 1511   TRIG 127 09/29/2015 1039   HDL 35 (L) 02/05/2019 1511   HDL 36 (L) 09/29/2015 1039   CHOLHDL 4.4 02/20/2018 0859   CHOLHDL 4.0 01/11/2017 0444   VLDL 30 01/11/2017 0444   LDLCALC 87 02/05/2019 1511   LDLCALC 142 (H) 09/29/2015 1039    Physical Exam:    VS:  BP 130/80 (BP Location: Left Arm, Patient Position: Sitting, Cuff Size: Normal)   Pulse 69   Ht 5' 7.5" (1.715 m)   Wt 199 lb (90.3 kg)   SpO2 96%   BMI 30.71 kg/m     Wt Readings from Last 3 Encounters:  03/18/19 199 lb (90.3 kg)  02/05/19 197 lb (89.4 kg)  12/16/18 192 lb 12.8 oz (87.5 kg)     GEN: Well nourished, well developed in no acute distress HEENT: Normal NECK: No JVD; No carotid bruits LYMPHATICS: No lymphadenopathy CARDIAC: S1S2 noted,RRR, no murmurs,  rubs, gallops RESPIRATORY:  Clear to auscultation without rales, wheezing or rhonchi  ABDOMEN: Soft, non-tender, non-distended, +bowel sounds, no guarding. EXTREMITIES: No edema, No cyanosis, no clubbing MUSCULOSKELETAL:  No deformity  SKIN: Warm and dry NEUROLOGIC:  Alert and oriented x 3, non-focal PSYCHIATRIC:  Normal affect, good insight  ASSESSMENT:    1. Essential hypertension   2. Muscle stiffness   3. Fatigue, unspecified type   4. Numbness    PLAN:     1.  Current symptoms of morning stiffness and numbness does sound Byrd like early stages of rheumatoid arthritis.  He will need to see a rheumatologist.    Patient complains of numbness and tingling tells me that he seen a neurologist in a couple of weeks.  They are planning a study on nerves.  For now I am going to order TSH and vitamin B12  Coronary artery disease is stable. His blood pressure is acceptable today despite his recent change in medication.  No changes will be made to his medication regimen  The patient is in agreement with the above plan. The patient left the office in stable condition.  The patient will follow up in 6 months with Chad Byrd   Medication Adjustments/Labs and Tests Ordered: Current medicines are reviewed at length with the patient today.  Concerns regarding medicines are outlined above.  Orders Placed This Encounter  Procedures  . TSH  . B12  . Ambulatory referral to Rheumatology   No orders of the defined types were placed in this encounter.   Patient Instructions  Medication Instructions:  Your physician recommends that you continue on your current medications as directed. Please refer to the Current Medication list given to you today.  *If you need a refill on your cardiac medications before your next appointment, please call your pharmacy*  Lab Work: Your physician recommends that you return for lab work in: [place]  If you have labs (blood work) drawn today and  your tests are completely normal, you will receive your results only by: [name] MyChart Message (if you have MyChart) OR . A paper copy in the mail If you have any lab test that is abnormal or we need to change your treatment, we will call you to review the results.  Testing/Procedures: None  Follow-Up: At [facility], you and your health needs are our priority.  As part of our continuing mission to provide you with exceptional heart care, we have  created designated Provider Care Teams.  These Care Teams include your primary Cardiologist (physician) and Advanced Practice Providers (APPs -  Physician Assistants and Nurse Practitioners) who all work together to provide you with the care you need, when you need it.  Your next appointment:   6 month(s)  The format for your next appointment:   In Person  Provider:   Berniece Salines, DO  Other Instructions YOU ARE BEING REFERRED TO A RHEUMATOLOGIST FOR YOUR MUSCLE STIFFNESS.THEY WILL CONTACT YOU WITH AN APPOINTMENT DATE AND TIME     Adopting a Healthy Lifestyle.  Know what a healthy weight is for you (roughly BMI <25) and aim to maintain this   Aim for 7+ servings of fruits and vegetables daily   65-80+ fluid ounces of water or unsweet tea for healthy kidneys   Limit to max 1 drink of alcohol per day; avoid smoking/tobacco   Limit animal fats in diet for cholesterol and heart health - choose grass fed whenever available   Avoid highly processed foods, and foods high in saturated/trans fats   Aim for low stress - take time to unwind and care for your mental health   Aim for 150 min of moderate intensity exercise weekly for heart health, and weights twice weekly for bone health   Aim for 7-9 hours of sleep daily   When it comes to diets, agreement about the perfect plan isnt easy to find, even among the experts. Experts at the [employer] developed an idea known as the Healthy Eating Plate. Just imagine a plate  divided into logical, healthy portions.   The emphasis is on diet quality:   Load up on vegetables and fruits - one-half of your plate: Aim for color and variety, and remember that potatoes dont count.   Go for whole grains - one-quarter of your plate: Whole wheat, barley, wheat berries, quinoa, oats, brown rice, and foods made with them. If you want pasta, go with whole wheat pasta.   Protein power - one-quarter of your plate: Fish, chicken, beans, and nuts are all healthy, versatile protein sources. Limit red meat.   The diet, however, does go beyond the plate, offering a few other suggestions.   Use healthy plant oils, such as olive, canola, soy, corn, sunflower and peanut. Check the labels, and avoid partially hydrogenated oil, which have unhealthy trans fats.   If youre thirsty, drink water. Coffee and tea are good in moderation, but skip sugary drinks and limit milk and dairy products to one or two daily servings.   The type of carbohydrate in the diet is Byrd important than the amount. Some sources of carbohydrates, such as vegetables, fruits, whole grains, and beans-are healthier than others.   Finally, stay active  Signed, Berniece Salines, DO  03/18/2019 12:34 PM    [facility]

## 2019-03-18 NOTE — Patient Instructions (Signed)
Medication Instructions:  Your physician recommends that you continue on your current medications as directed. Please refer to the Current Medication list given to you today.  *If you need a refill on your cardiac medications before your next appointment, please call your pharmacy*  Lab Work: Your physician recommends that you return for lab work in: [place]  If you have labs (blood work) drawn today and your tests are completely normal, you will receive your results only by: [name] MyChart Message (if you have MyChart) OR . A paper copy in the mail If you have any lab test that is abnormal or we need to change your treatment, we will call you to review the results.  Testing/Procedures: None  Follow-Up: At [facility], you and your health needs are our priority.  As part of our continuing mission to provide you with exceptional heart care, we have created designated Provider Care Teams.  These Care Teams include your primary Cardiologist (physician) and Advanced Practice Providers (APPs -  Physician Assistants and Nurse Practitioners) who all work together to provide you with the care you need, when you need it.  Your next appointment:   6 month(s)  The format for your next appointment:   In Person  Provider:   Berniece Salines, DO  Other Instructions YOU ARE BEING REFERRED TO A RHEUMATOLOGIST FOR YOUR MUSCLE STIFFNESS.THEY WILL CONTACT YOU WITH AN APPOINTMENT DATE AND TIME

## 2019-03-19 LAB — VITAMIN B12: Vitamin B-12: 594 pg/mL (ref 232–1245)

## 2019-03-19 LAB — TSH: TSH: 2.07 u[IU]/mL (ref 0.450–4.500)

## 2019-03-20 ENCOUNTER — Telehealth: Payer: Self-pay | Admitting: Cardiology

## 2019-03-20 NOTE — Telephone Encounter (Signed)
Spoke to [employer] given.

## 2019-03-20 NOTE — Telephone Encounter (Signed)
New Message    Chad Byrd is retuning call for results    Please call

## 2019-04-01 ENCOUNTER — Encounter: Payer: Medicare Other | Admitting: Neurology

## 2019-04-02 ENCOUNTER — Other Ambulatory Visit: Payer: Self-pay | Admitting: Cardiology

## 2019-05-04 ENCOUNTER — Emergency Department (HOSPITAL_COMMUNITY)
Admission: EM | Admit: 2019-05-04 | Discharge: 2019-05-04 | Disposition: A | Discharge status: Home / Self Care | Payer: Medicare Other | Attending: Emergency Medicine | Admitting: Emergency Medicine

## 2019-05-04 ENCOUNTER — Other Ambulatory Visit: Payer: Self-pay

## 2019-05-04 DIAGNOSIS — I2510 Atherosclerotic heart disease of native coronary artery without angina pectoris: Secondary | ICD-10-CM | POA: Insufficient documentation

## 2019-05-04 DIAGNOSIS — Z8673 Personal history of transient ischemic attack (TIA), and cerebral infarction without residual deficits: Secondary | ICD-10-CM | POA: Insufficient documentation

## 2019-05-04 DIAGNOSIS — Z79899 Other long term (current) drug therapy: Secondary | ICD-10-CM | POA: Diagnosis not present

## 2019-05-04 DIAGNOSIS — N289 Disorder of kidney and ureter, unspecified: Secondary | ICD-10-CM | POA: Diagnosis not present

## 2019-05-04 DIAGNOSIS — R42 Dizziness and giddiness: Secondary | ICD-10-CM | POA: Diagnosis present

## 2019-05-04 DIAGNOSIS — I10 Essential (primary) hypertension: Secondary | ICD-10-CM | POA: Insufficient documentation

## 2019-05-04 DIAGNOSIS — Z7982 Long term (current) use of aspirin: Secondary | ICD-10-CM | POA: Insufficient documentation

## 2019-05-04 DIAGNOSIS — H81399 Other peripheral vertigo, unspecified ear: Secondary | ICD-10-CM | POA: Diagnosis not present

## 2019-05-04 LAB — BASIC METABOLIC PANEL
Anion gap: 10 (ref 5–15)
BUN: 26 mg/dL — ABNORMAL HIGH (ref 8–23)
CO2: 26 mmol/L (ref 22–32)
Calcium: 9.3 mg/dL (ref 8.9–10.3)
Chloride: 105 mmol/L (ref 98–111)
Creatinine, Ser: 1.3 mg/dL — ABNORMAL HIGH (ref 0.61–1.24)
GFR calc Af Amer: 56 mL/min — ABNORMAL LOW (ref >60)
GFR calc non Af Amer: 49 mL/min — ABNORMAL LOW (ref >60)
Glucose, Bld: 124 mg/dL — ABNORMAL HIGH (ref 70–99)
Potassium: 4.1 mmol/L (ref 3.5–5.1)
Sodium: 141 mmol/L (ref 135–145)

## 2019-05-04 LAB — CBC
HCT: 46.7 % (ref 39.0–52.0)
Hemoglobin: 15.3 g/dL (ref 13.0–17.0)
MCH: 30.5 pg (ref 26.0–34.0)
MCHC: 32.8 g/dL (ref 30.0–36.0)
MCV: 93 fL (ref 80.0–100.0)
Platelets: 197 10*3/uL (ref 150–400)
RBC: 5.02 MIL/uL (ref 4.22–5.81)
RDW: 12.9 % (ref 11.5–15.5)
WBC: 4.7 10*3/uL (ref 4.0–10.5)
nRBC: 0 % (ref 0.0–0.2)

## 2019-05-04 MED ORDER — SODIUM CHLORIDE 0.9% FLUSH: 3.0000 mL | Freq: Once | INTRAVENOUS | Status: DC

## 2019-05-04 MED ORDER — MECLIZINE HCL 25 MG PO TABS: 25.0000 mg | ORAL_TABLET | Freq: Three times a day (TID) | ORAL | 0 refills | Status: DC | PRN

## 2019-05-04 NOTE — ED Provider Notes (Signed)
[facility] EMERGENCY DEPARTMENT Provider Note   CSN: [id number] Arrival date & time: 05/04/19  0217   History Chief Complaint  Patient presents with  . Dizziness    Chad Byrd is a 86 y.o. male.  The history is provided by the patient.  Dizziness He has history of hypertension, hyperlipidemia, coronary artery disease and comes in following an episode of dizziness at home.  He states that he was in bed and he looked at his phone and it seemed to be spinning.  He got up to walk and noted that he was off balance.  He denies nausea.  He has chronic tinnitus which has not changed and he denies any ear pain or hearing loss.  Symptoms lasted about 30 minutes before resolving.  While he was dizzy, he did check his blood pressure and it was significantly elevated to as high as 202/84.  He denies any headache.  He feels like he is completely back to normal now.  Dizziness did not seem to worsen with change in position.  He has never had similar symptoms in the past.  Past Medical History:  Diagnosis Date  . CAD (coronary artery disease)   . Cataract   . Hyperlipidemia   . Hypertension     Patient Active Problem List   Diagnosis Date Noted  . Pruritus 02/05/2019  . Adverse reaction to drug that acts primarily on skin 10/09/2018  . Left sided numbness 01/10/2017  . TIA (transient ischemic attack) 01/10/2017  . HTN (hypertension) 01/10/2017  . Statin intolerance 03/07/2016  . Coronary artery disease involving native coronary artery of native heart without angina pectoris 09/29/2015  . Thoracic aortic aneurysm without rupture ([place]) 09/29/2015  . Hyperlipidemia 09/29/2015    Past Surgical History:  Procedure Laterality Date  . HERNIA REPAIR  1982  . RECTAL SURGERY  mid 85s       Family History  Problem Relation Age of Onset  . Alzheimer's disease Brother   . Lung cancer Sister     Social History   Tobacco Use  . Smoking status: Never Smoker  .  Smokeless tobacco: Never Used  Substance Use Topics  . Alcohol use: Yes    Comment: 1-2 oz of Scotch daily  . Drug use: No    Home Medications Prior to Admission medications   Medication Sig Start Date End Date Taking? Authorizing Provider  aspirin EC 81 MG tablet Take 1 tablet (81 mg total) by mouth daily. 02/05/19   Richardo Priest, MD  EPINEPHrine 0.3 mg/0.3 mL IJ SOAJ injection Inject 0.3 mg into the muscle as needed for anaphylaxis.  09/25/17   [provider]  finasteride (PROSCAR) 5 MG tablet Take 5 mg by mouth daily.  04/26/18   [provider]  isosorbide mononitrate (IMDUR) 30 MG 24 hr tablet TAKE 1 TABLET BY MOUTH EVERY DAY 01/27/19   Hilty, Nadean Corwin, MD  metoprolol succinate (TOPROL-XL) 25 MG 24 hr tablet TAKE 1 TABLET BY MOUTH EVERY DAY 01/03/19   Hilty, Nadean Corwin, MD  Multiple Vitamin (MULTI-VITAMIN) tablet Take by mouth.    [provider]  NEXLETOL 180 MG TABS TAKE 1 TABLET BY MOUTH DAILY 04/02/19   Richardo Priest, MD  nitroGLYCERIN (NITROSTAT) 0.4 MG SL tablet Place 0.4 mg under the tongue every 5 (five) minutes as needed for chest pain.    [provider]  rivaroxaban (XARELTO) 20 MG TABS tablet Take 1 tablet (20 mg total) by mouth daily with  supper. 02/05/19   Richardo Priest, MD    Allergies    Atorvastatin, Pravastatin, Simvastatin, Statins, and Eliquis [apixaban]  Review of Systems   Review of Systems  Neurological: Positive for dizziness.  All other systems reviewed and are negative.   Physical Exam Updated Vital Signs BP 117/78 (BP Location: Right Arm)   Pulse 75   Temp 98.4 F (36.9 C) (Oral)   Resp 18   SpO2 97%   Physical Exam Vitals and nursing note reviewed.   86 year old male, resting comfortably and in no acute distress. Vital signs are normal. Oxygen saturation is 97%, which is normal. Head is normocephalic and atraumatic. PERRLA, EOMI. Oropharynx is clear.  Tympanic membranes are clear. Neck is nontender and  supple without adenopathy or JVD.  There are no carotid bruits. Back is nontender and there is no CVA tenderness. Lungs are clear without rales, wheezes, or rhonchi. Chest is nontender. Heart has regular rate and rhythm without murmur. Abdomen is soft, flat, nontender without masses or hepatosplenomegaly and peristalsis is normoactive. Extremities have no cyanosis or edema, full range of motion is present. Skin is warm and dry without rash. Neurologic: Mental status is normal, cranial nerves are intact, there are no motor or sensory deficits.  Finger-to-nose testing is normal.  Dizziness is not reproduced by passive head movement.  ED Results / Procedures / Treatments   Labs (all labs ordered are listed, but only abnormal results are displayed) Labs Reviewed  BASIC METABOLIC PANEL - Abnormal; Notable for the following components:      Result Value   Glucose, Bld 124 (*)    BUN 26 (*)    Creatinine, Ser 1.30 (*)    GFR calc non Af Amer 49 (*)    GFR calc Af Amer 56 (*)    All other components within normal limits  CBC  URINALYSIS, ROUTINE W REFLEX MICROSCOPIC    EKG EKG Interpretation  Date/Time:  Sunday May 04 2019 02:40:48 EDT Ventricular Rate:  75 PR Interval:  178 QRS Duration: 90 QT Interval:  404 QTC Calculation: 451 R Axis:   -58 Text Interpretation: Normal sinus rhythm Left anterior fascicular block Minimal voltage criteria for LVH, may be normal variant ( R in aVL ) Cannot rule out Anterior infarct , age undetermined Abnormal ECG When compared with ECG of 05/23/2018, No significant change was found Confirmed by Delora Fuel ([id number]) on 05/04/2019 5:03:38 AM  Procedures Procedures   Medications Ordered in ED Medications  sodium chloride flush (NS) 0.9 % injection 3 mL (has no administration in time range)    ED Course  I have reviewed the triage vital signs and the nursing notes.  Pertinent labs & imaging results that were available during my care of the patient  were reviewed by me and considered in my medical decision making (see chart for details).  Transient episode of dizziness which sounds like peripheral vertigo.  Possible posterior circulation transient ischemic attack.  Old records are reviewed, and he had a hospital admission for transient ischemic attack in December 2018.  Since his symptoms have completely resolved, and he has had TIA work-up, I do not see indication for aggressive work-up at this time.  Labs do show mild renal insufficiency which is unchanged from baseline.  Will ambulate in the ED, and, if he is able to ambulate without difficulty, I feel he would be safe for discharge.  He ambulated in the ED without difficulty and is discharged with prescription for  meclizine. MDM Rules/Calculators/A&P  Final Clinical Impression(s) / ED Diagnoses Final diagnoses:  Peripheral vertigo, unspecified laterality  Renal insufficiency    Rx / DC Orders ED Discharge Orders    None       Delora Fuel, MD [id number] [phone]

## 2019-05-04 NOTE — ED Triage Notes (Signed)
Per pt he was going to bed tonight and looked at the clock and he felt like the clock was spinning around and around and he got very dizzy and weak. Pt said his pressure shot up over 200. Pt said no blurred vision, no headaches with some nausea. No weakness in arms or legs.

## 2019-05-04 NOTE — Discharge Instructions (Addendum)
Continue to monitor your blood pressure at home.  Return if you are having any problems.

## 2019-05-04 NOTE — ED Notes (Signed)
Pt ambulated well with smooth and steady gait.

## 2019-05-05 ENCOUNTER — Other Ambulatory Visit: Payer: Self-pay | Admitting: Internal Medicine

## 2019-05-12 ENCOUNTER — Telehealth: Payer: Self-pay | Admitting: Cardiology

## 2019-05-12 NOTE — Telephone Encounter (Signed)
LMTCB regarding HTN concerns

## 2019-05-12 NOTE — Telephone Encounter (Signed)
Spoke with the patients spouse, Chad Byrd who reports that the patient went to the hospital last weekend due to hypertension. She states his BP was 190s/80s  He was discharged less than 24 hours later with no changes in medication but he was given a Rx for dizziness.   Pt denies SOB, CP or swelling. He does get light headed at times.  He takes his BP meds in the evening at dinner.  Pt has minimal salt intake.  Pt has not made any med or diet changes.   BP readings: 4/10- 195/82 4/11- 136/63 4/12- 135/62

## 2019-05-12 NOTE — Telephone Encounter (Signed)
I reviewed ED note had vertigo best to FU with his PCP

## 2019-05-12 NOTE — Telephone Encounter (Signed)
Follow up     Chad Byrd is returning call     Please call back

## 2019-05-12 NOTE — Telephone Encounter (Signed)
Spoke with the patients spouse again and let her know that Dr. Bettina Gavia suggests patient follow up with his PCP. She verbalized understanding and is aware to call back with any new concerns.

## 2019-05-12 NOTE — Telephone Encounter (Signed)
Pt c/o BP issue: STAT if pt c/o blurred vision, one-sided weakness or slurred speech  1. What are your last 5 BP readings?   04/12: 135/62 04/11: 136/63 04/10: 195/82   2. Are you having any other symptoms (ex. Dizziness, headache, blurred vision, passed out)? Dizziness, fatigue and blurred vision  3. What is your BP issue? Patient's spouse states on 05/03/19 the patient went to the hospital due to high BP. BP decreased and patient went home. However, on 05/03/19 BP increased to 195/82. She states she is concerned that the patient may need to alter his medication. Please return call to discuss.

## 2019-05-19 DIAGNOSIS — L821 Other seborrheic keratosis: Secondary | ICD-10-CM | POA: Diagnosis not present

## 2019-05-19 DIAGNOSIS — L570 Actinic keratosis: Secondary | ICD-10-CM | POA: Diagnosis not present

## 2019-05-19 DIAGNOSIS — L578 Other skin changes due to chronic exposure to nonionizing radiation: Secondary | ICD-10-CM | POA: Diagnosis not present

## 2019-05-19 DIAGNOSIS — C44629 Squamous cell carcinoma of skin of left upper limb, including shoulder: Secondary | ICD-10-CM | POA: Diagnosis not present

## 2019-05-22 DIAGNOSIS — I10 Essential (primary) hypertension: Secondary | ICD-10-CM | POA: Diagnosis not present

## 2019-05-22 DIAGNOSIS — Z8673 Personal history of transient ischemic attack (TIA), and cerebral infarction without residual deficits: Secondary | ICD-10-CM | POA: Diagnosis not present

## 2019-05-22 DIAGNOSIS — R42 Dizziness and giddiness: Secondary | ICD-10-CM | POA: Diagnosis not present

## 2019-05-22 DIAGNOSIS — I679 Cerebrovascular disease, unspecified: Secondary | ICD-10-CM | POA: Diagnosis not present

## 2019-05-26 ENCOUNTER — Telehealth: Payer: Self-pay | Admitting: Cardiology

## 2019-05-26 NOTE — Progress Notes (Signed)
Office Visit Note  Patient: Chad Byrd             Date of Birth: Oct 16, 1928           MRN: [id number]             PCP: Chad Paddy, NP Referring: Chad Salines, DO Visit Date: 05/28/2019 Occupation: @GUAROCC @  Subjective:  Pain in hands, intermittent rash and itching   History of Present Illness: Chad Byrd is a 86 y.o. male seen in consultation per request of Dr. Harriet Masson for evaluation of joint pain.  According to patient his symptoms a started 1-1/2 years ago with itching all over his body.  He states 1 time the itching was so bad that he had to go to the emergency room and he was given EpiPen.  Since then he has seen a dermatologist and allergist and no etiology was established.  Dr. Para March took him off the lisinopril and Plavix in November and switched him to aspirin.  Which helped his itching remarkably.  He states while he was itching he occasionally will get rash with it.  He has taken Zyrtec which was very helpful.  He states 1 month ago he had itching which lasted for short time and then resolved.  He also experienced left arm numbness.  He states he had cardiology neurology evaluation which was negative and the numbness resolved.  Besides stiffness and discomfort in his hands none of the other joints are painful.  He states he has had lower back pain for many years.  He denies any joint swelling.  Some of his friends are concerned that he may have Lyme disease.  He wants to be tested for the Lyme.  Activities of Daily Living:  Patient reports morning stiffness for 0 hours   Patient Denies nocturnal pain.  Difficulty dressing/grooming: Denies Difficulty climbing stairs: Denies Difficulty getting out of chair: Denies Difficulty using hands for taps, buttons, cutlery, and/or writing: Reports  Review of Systems  Constitutional: Positive for fatigue. Negative for night sweats.  HENT: Negative for mouth sores, mouth dryness and nose dryness.   Eyes: Positive for dryness.  Negative for redness.  Respiratory: Negative for shortness of breath and difficulty breathing.   Cardiovascular: Negative for chest pain, palpitations, hypertension, irregular heartbeat and swelling in legs/feet.  Gastrointestinal: Negative for constipation and diarrhea.  Endocrine: Positive for cold intolerance and increased urination.  Genitourinary: Negative for difficulty urinating.  Musculoskeletal: Positive for arthralgias, joint pain and morning stiffness. Negative for joint swelling, myalgias, muscle weakness, muscle tenderness and myalgias.  Skin: Positive for rash. Negative for color change, hair loss, nodules/bumps, skin tightness, ulcers and sensitivity to sunlight.  Allergic/Immunologic: Negative for susceptible to infections.  Neurological: Negative for dizziness, fainting, memory loss, night sweats and weakness.  Hematological: Positive for bruising/bleeding tendency. Negative for swollen glands.  Psychiatric/Behavioral: Negative for depressed mood and sleep disturbance. The patient is not nervous/anxious.     PMFS History:  Patient Active Problem List   Diagnosis Date Noted  . Primary osteoarthritis of both hands 05/28/2019  . Primary osteoarthritis of both feet 05/28/2019  . Pruritus 02/05/2019  . Adverse reaction to drug that acts primarily on skin 10/09/2018  . Left sided numbness 01/10/2017  . TIA (transient ischemic attack) 01/10/2017  . HTN (hypertension) 01/10/2017  . Statin intolerance 03/07/2016  . Coronary artery disease involving native coronary artery of native heart without angina pectoris 09/29/2015  . Thoracic aortic aneurysm without rupture ([place]) 09/29/2015  . Hyperlipidemia  09/29/2015    Past Medical History:  Diagnosis Date  . CAD (coronary artery disease)   . Cataract   . Hyperlipidemia   . Hypertension     Family History  Problem Relation Age of Onset  . Alzheimer's disease Brother   . Lung cancer Sister    Past Surgical History:    Procedure Laterality Date  . HERNIA REPAIR  1982  . RECTAL SURGERY  mid 10s   Social History   Social History Narrative   One story house    exercise - walk dogs daily, work on farm      Patent is right-handed. He lives with his girlfriend of 78 years, [name].    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    There is no immunization history on file for this patient.   Objective: Vital Signs: BP 124/69 (BP Location: Right Arm, Patient Position: Sitting, Cuff Size: Normal)   Pulse 65   Resp 18   Ht 5\' 8"  (1.727 m)   Wt 194 lb 6.4 oz (88.2 kg)   BMI 29.56 kg/m    Physical Exam Vitals and nursing note reviewed.  Constitutional:      Appearance: He is well-developed.  HENT:     Head: Normocephalic and atraumatic.  Eyes:     Conjunctiva/sclera: Conjunctivae normal.     Pupils: Pupils are equal, round, and reactive to light.  Cardiovascular:     Rate and Rhythm: Normal rate and regular rhythm.     Heart sounds: Normal heart sounds.  Pulmonary:     Effort: Pulmonary effort is normal.     Breath sounds: Normal breath sounds.  Abdominal:     General: Bowel sounds are normal.     Palpations: Abdomen is soft.  Musculoskeletal:     Cervical back: Normal range of motion and neck supple.     Right lower leg: Edema present.     Left lower leg: Edema present.  Skin:    General: Skin is warm and dry.     Capillary Refill: Capillary refill takes less than 2 seconds.     Comments: SK lesions were noted on examination.  No rash was noted.  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  Neurological:     Mental Status: He is alert and oriented to person, place, and time.  Psychiatric:        Behavior: Behavior normal.      Musculoskeletal Exam: C-spine was in good range of motion.  He has some discomfort and limitation range of motion of the lumbar spine.  Shoulder joints, elbow joints, wrist joints with good range of motion.  He has bilateral PIP and DIP thickening  with no synovitis.  Hip joints, knee joints, ankles with good range of motion.  He has DIP and PIP thickening in his feet.  No synovitis was noted.  CDAI Exam: CDAI Score: -- Patient Global: --; Provider Global: -- Swollen: --; Tender: -- Joint Exam 05/28/2019   No joint exam has been documented for this visit   There is currently no information documented on the homunculus. Go to the Rheumatology activity and complete the homunculus joint exam.  Investigation: No additional findings.  Imaging: No results found.  Recent Labs: Lab Results  Component Value Date   WBC 4.7 05/04/2019   HGB 15.3 05/04/2019   PLT 197 05/04/2019   NA 141 05/04/2019   K 4.1 05/04/2019   CL 105 05/04/2019   CO2 26 05/04/2019   GLUCOSE  124 (H) 05/04/2019   BUN 26 (H) 05/04/2019   CREATININE 1.30 (H) 05/04/2019   BILITOT 0.4 02/05/2019   ALKPHOS 46 02/05/2019   AST 34 02/05/2019   ALT 26 02/05/2019   PROT 6.1 02/05/2019   ALBUMIN 4.1 02/05/2019   CALCIUM 9.3 05/04/2019   GFRAA 56 (L) 05/04/2019    Speciality Comments: No specialty comments available.  Procedures:  No procedures performed Allergies: Atorvastatin, Pravastatin, Simvastatin, Statins, and Eliquis [apixaban]   Assessment / Plan:     Visit Diagnoses: Joint stiffness -patient complains of some joint stiffness mostly in his hands.  The clinical findings are consistent with osteoarthritis.  Patient is concerned about underlying autoimmune diseases and Lyme disease.  Per their request we will obtain some additional labs today.  He has no clinical features of autoimmune disease on examination today.  Plan: Sedimentation rate, Rheumatoid factor, Cyclic citrul peptide antibody, IgG, ANA, B. burgdorfi antibodies  Primary osteoarthritis of both hands-clinical findings are consistent with osteoarthritis.  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  No synovitis was noted.  Primary osteoarthritis of both feet-clinical  findings are consistent with osteoarthritis in his feet.  Rash-no rash was noted today.  He states he gets intermittent rash with pruritus.  He has had dermatology and allergy evaluation in the past.  Other medical problems are listed as follows:  Coronary artery disease involving native coronary artery of native heart without angina pectoris  Essential hypertension  Thoracic aortic aneurysm without rupture ([facility])  TIA (transient ischemic attack)  Statin intolerance  History of hyperlipidemia  Orders: Orders Placed This Encounter  Procedures  . Sedimentation rate  . Rheumatoid factor  . Cyclic citrul peptide antibody, IgG  . ANA  . B. burgdorfi antibodies   No orders of the defined types were placed in this encounter.   Face-to-face time spent with patient was 45 minutes. Greater than 50% of time was spent in counseling and coordination of care.  Follow-Up Instructions: Return for Osteoarthritis.   Bo Merino, MD  Note - This record has been created using Editor, commissioning.  Chart creation errors have been sought, but may not always  have been located. Such creation errors do not reflect on  the standard of medical care.

## 2019-05-26 NOTE — Telephone Encounter (Signed)
I looked at the blood pressures I think in general they are in range and I would not chase isolated elevations like is seen in the afternoon continue his current treatment

## 2019-05-26 NOTE — Telephone Encounter (Signed)
Pt c/o BP issue: STAT if pt c/o blurred vision, one-sided weakness or slurred speech  1. What are your last 5 BP readings?  Monday 04-26: 132/59  Sunday 04-25: 126/69  Saturday 04-24: 129/62 Dinnertime Saturday 04-24: 147/66 Afternoon  Friday 04-23: 129/63 Thursday 04-22: 131/67   2. Are you having any other symptoms (ex. Dizziness, headache, blurred vision, passed out)? Headache, but only occurred on Saturday afternoon   3. What is your BP issue? Spike in BP usually happens at night. When he puts his feet up and rests his BP comes down on its own. Partner wanted to know what to do about BP spikes

## 2019-05-26 NOTE — Telephone Encounter (Signed)
Message left with details of Dr. Joya Gaskins recommendations.

## 2019-05-28 ENCOUNTER — Other Ambulatory Visit: Payer: Self-pay

## 2019-05-28 ENCOUNTER — Encounter: Payer: Self-pay | Admitting: Rheumatology

## 2019-05-28 ENCOUNTER — Ambulatory Visit (INDEPENDENT_AMBULATORY_CARE_PROVIDER_SITE_OTHER): Payer: Medicare Other | Admitting: Rheumatology

## 2019-05-28 VITALS — BP 124/69 | HR 65 | Resp 18 | Ht 68.0 in | Wt 194.4 lb

## 2019-05-28 DIAGNOSIS — I712 Thoracic aortic aneurysm, without rupture, unspecified: Secondary | ICD-10-CM

## 2019-05-28 DIAGNOSIS — M2560 Stiffness of unspecified joint, not elsewhere classified: Secondary | ICD-10-CM | POA: Diagnosis not present

## 2019-05-28 DIAGNOSIS — R21 Rash and other nonspecific skin eruption: Secondary | ICD-10-CM

## 2019-05-28 DIAGNOSIS — Z8639 Personal history of other endocrine, nutritional and metabolic disease: Secondary | ICD-10-CM | POA: Diagnosis not present

## 2019-05-28 DIAGNOSIS — I2510 Atherosclerotic heart disease of native coronary artery without angina pectoris: Secondary | ICD-10-CM

## 2019-05-28 DIAGNOSIS — G459 Transient cerebral ischemic attack, unspecified: Secondary | ICD-10-CM

## 2019-05-28 DIAGNOSIS — Z789 Other specified health status: Secondary | ICD-10-CM | POA: Diagnosis not present

## 2019-05-28 DIAGNOSIS — M19071 Primary osteoarthritis, right ankle and foot: Secondary | ICD-10-CM | POA: Diagnosis not present

## 2019-05-28 DIAGNOSIS — M19041 Primary osteoarthritis, right hand: Secondary | ICD-10-CM | POA: Insufficient documentation

## 2019-05-28 DIAGNOSIS — M19072 Primary osteoarthritis, left ankle and foot: Secondary | ICD-10-CM | POA: Diagnosis not present

## 2019-05-28 DIAGNOSIS — M19042 Primary osteoarthritis, left hand: Secondary | ICD-10-CM | POA: Diagnosis not present

## 2019-05-28 DIAGNOSIS — I10 Essential (primary) hypertension: Secondary | ICD-10-CM | POA: Diagnosis not present

## 2019-05-28 HISTORY — DX: Primary osteoarthritis, left ankle and foot: M19.071

## 2019-05-28 HISTORY — DX: Primary osteoarthritis, right hand: M19.041

## 2019-05-29 LAB — SEDIMENTATION RATE: Sed Rate: 6 mm/h (ref 0–20)

## 2019-05-29 LAB — B. BURGDORFI ANTIBODIES: B burgdorferi Ab IgG+IgM: <0.9 index

## 2019-05-29 LAB — ANA: Anti Nuclear Antibody (ANA): NEGATIVE

## 2019-05-29 LAB — CYCLIC CITRUL PEPTIDE ANTIBODY, IGG: Cyclic Citrullin Peptide Ab: <16 UNITS

## 2019-05-29 LAB — RHEUMATOID FACTOR: Rheumatoid fact SerPl-aCnc: <14 IU/mL (ref <14)

## 2019-06-10 DIAGNOSIS — Z79899 Other long term (current) drug therapy: Secondary | ICD-10-CM | POA: Diagnosis not present

## 2019-06-10 DIAGNOSIS — I10 Essential (primary) hypertension: Secondary | ICD-10-CM | POA: Diagnosis not present

## 2019-06-10 DIAGNOSIS — I25118 Atherosclerotic heart disease of native coronary artery with other forms of angina pectoris: Secondary | ICD-10-CM | POA: Diagnosis not present

## 2019-06-10 DIAGNOSIS — Z23 Encounter for immunization: Secondary | ICD-10-CM | POA: Diagnosis not present

## 2019-06-10 DIAGNOSIS — Z1211 Encounter for screening for malignant neoplasm of colon: Secondary | ICD-10-CM | POA: Diagnosis not present

## 2019-06-10 DIAGNOSIS — E7800 Pure hypercholesterolemia, unspecified: Secondary | ICD-10-CM | POA: Diagnosis not present

## 2019-06-10 DIAGNOSIS — I679 Cerebrovascular disease, unspecified: Secondary | ICD-10-CM | POA: Diagnosis not present

## 2019-06-10 NOTE — Progress Notes (Signed)
Office Visit Note  Patient: Chad Byrd             Date of Birth: 10-16-1928           MRN: [id number]             PCP: Elenore Paddy, NP Referring: Elenore Paddy, NP Visit Date: 06/17/2019 Occupation: _0 @  Subjective:  Pain in multiple joints   History of Present Illness: Xavien Dauphinais is a 86 y.o. male with osteoarthritis.  He states he continues to have pain and discomfort in multiple joints.  He states he is very stiff in the morning due to lower back pain.  Has been also having stiffness and pain in his both hands.  He is noticed some off-and-on swelling.  He had no recurrence of rash.  Activities of Daily Living:  Patient reports morning stiffness for 1 hour.   Patient Reports nocturnal pain.  Difficulty dressing/grooming: Denies Difficulty climbing stairs: Denies Difficulty getting out of chair: Denies Difficulty using hands for taps, buttons, cutlery, and/or writing: Denies  Review of Systems  Constitutional: Positive for fatigue. Negative for night sweats.  HENT: Negative for mouth sores, mouth dryness and nose dryness.   Eyes: Positive for dryness. Negative for redness.  Respiratory: Negative for shortness of breath and difficulty breathing.   Cardiovascular: Negative for chest pain, palpitations, hypertension, irregular heartbeat and swelling in legs/feet.  Gastrointestinal: Negative for constipation and diarrhea.  Endocrine: Negative for increased urination.  Genitourinary: Positive for difficulty urinating.  Musculoskeletal: Positive for arthralgias, joint pain and morning stiffness. Negative for joint swelling, myalgias, muscle weakness, muscle tenderness and myalgias.  Skin: Negative for color change, rash, hair loss, nodules/bumps, skin tightness, ulcers and sensitivity to sunlight.  Allergic/Immunologic: Negative for susceptible to infections.  Neurological: Negative for dizziness, fainting, numbness, memory loss, night sweats and weakness ( ).   Hematological: Positive for bruising/bleeding tendency. Negative for swollen glands.  Psychiatric/Behavioral: Negative for depressed mood and sleep disturbance. The patient is not nervous/anxious.     PMFS History:  Patient Active Problem List   Diagnosis Date Noted  . Primary osteoarthritis of both hands 05/28/2019  . Primary osteoarthritis of both feet 05/28/2019  . Pruritus 02/05/2019  . Adverse reaction to drug that acts primarily on skin 10/09/2018  . Left sided numbness 01/10/2017  . TIA (transient ischemic attack) 01/10/2017  . HTN (hypertension) 01/10/2017  . Statin intolerance 03/07/2016  . Coronary artery disease involving native coronary artery of native heart without angina pectoris 09/29/2015  . Thoracic aortic aneurysm without rupture ([place]) 09/29/2015  . Hyperlipidemia 09/29/2015    Past Medical History:  Diagnosis Date  . CAD (coronary artery disease)   . Cataract   . Hyperlipidemia   . Hypertension     Family History  Problem Relation Age of Onset  . Alzheimer's disease Brother   . Lung cancer Sister    Past Surgical History:  Procedure Laterality Date  . HERNIA REPAIR  1982  . RECTAL SURGERY  mid 1s   Social History   Social History Narrative   One story house    exercise - walk dogs daily, work on farm      Patent is right-handed. He lives with his girlfriend of 27 years, [name].    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    There is no immunization history on file for this patient.   Objective: Vital Signs: BP 127/66 (BP Location: Left Arm, Patient Position: Sitting, Cuff Size: Normal)  Pulse 70   Resp 18   Ht 5' 8" (1.727 m)   Wt 193 lb 6.4 oz (87.7 kg)   BMI 29.41 kg/m    Physical Exam Vitals and nursing note reviewed.  Constitutional:      Appearance: He is well-developed.  HENT:     Head: Normocephalic and atraumatic.  Eyes:     Conjunctiva/sclera: Conjunctivae normal.     Pupils: Pupils are equal, round, and reactive  to light.  Cardiovascular:     Rate and Rhythm: Normal rate and regular rhythm.     Heart sounds: Normal heart sounds.  Pulmonary:     Effort: Pulmonary effort is normal.     Breath sounds: Normal breath sounds.  Abdominal:     General: Bowel sounds are normal.     Palpations: Abdomen is soft.  Musculoskeletal:     Cervical back: Normal range of motion and neck supple.  Skin:    General: Skin is warm and dry.     Capillary Refill: Capillary refill takes less than 2 seconds.  Neurological:     Mental Status: He is alert and oriented to person, place, and time.  Psychiatric:        Behavior: Behavior normal.      Musculoskeletal Exam: He has limited range of motion of his cervical and lumbar spine.  Shoulder joints, elbow joints, wrist joints with good range of motion.  He had bilateral PIP and DIP thickening with no synovitis.  Hip joints, knee joints, ankles with good range of motion with no synovitis.  CDAI Exam: CDAI Score: - Patient Global: -; Provider Global: - Swollen: -; Tender: - Joint Exam 06/17/2019   No joint exam has been documented for this visit   There is currently no information documented on the homunculus. Go to the Rheumatology activity and complete the homunculus joint exam.  Investigation: No additional findings.  Imaging: No results found.  Recent Labs: Lab Results  Component Value Date   WBC 4.7 05/04/2019   HGB 15.3 05/04/2019   PLT 197 05/04/2019   NA 141 05/04/2019   K 4.1 05/04/2019   CL 105 05/04/2019   CO2 26 05/04/2019   GLUCOSE 124 (H) 05/04/2019   BUN 26 (H) 05/04/2019   CREATININE 1.30 (H) 05/04/2019   BILITOT 0.4 02/05/2019   ALKPHOS 46 02/05/2019   AST 34 02/05/2019   ALT 26 02/05/2019   PROT 6.1 02/05/2019   ALBUMIN 4.1 02/05/2019   CALCIUM 9.3 05/04/2019   GFRAA 56 (L) 05/04/2019   May 28, 2019 ESR 6, RF negative, anti-CCP negative, ANA negative, Lyme negative  Speciality Comments: No specialty comments available.   Procedures:  No procedures performed Allergies: Atorvastatin, Pravastatin, Simvastatin, Statins, and Eliquis [apixaban]   Assessment / Plan:     Visit Diagnoses: Primary osteoarthritis of both hands - Clinical findings are consistent with osteoarthritis.  Joint protection muscle strengthening was discussed.  A handout on hand exercises was given.  All autoimmune work-up is negative.  Primary osteoarthritis of both feet-proper fitting shoes were discussed.  Rash-he has had no recurrence of rash.  Lower back pain without radiculopathy-he has a stiffness in the morning.  He denies any radiculopathy.  Have given him a handout on back exercises.  Other medical problems are listed as follows:  Essential hypertension  Coronary artery disease involving native coronary artery of native heart without angina pectoris  TIA (transient ischemic attack)  Thoracic aortic aneurysm without rupture ([facility])  Statin intolerance  History  of hyperlipidemia  Osteoporosis screening-I have advised him to get DEXA scan with his PCP.  Orders: No orders of the defined types were placed in this encounter.  No orders of the defined types were placed in this encounter.    Follow-Up Instructions: Return if symptoms worsen or fail to improve, for Osteoarthritis.   Bo Merino, MD  Note - This record has been created using Editor, commissioning.  Chart creation errors have been sought, but may not always  have been located. Such creation errors do not reflect on  the standard of medical care.

## 2019-06-13 DIAGNOSIS — Z1211 Encounter for screening for malignant neoplasm of colon: Secondary | ICD-10-CM | POA: Diagnosis not present

## 2019-06-17 ENCOUNTER — Encounter: Payer: Self-pay | Admitting: Rheumatology

## 2019-06-17 ENCOUNTER — Ambulatory Visit (INDEPENDENT_AMBULATORY_CARE_PROVIDER_SITE_OTHER): Payer: Medicare Other | Admitting: Rheumatology

## 2019-06-17 ENCOUNTER — Other Ambulatory Visit: Payer: Self-pay

## 2019-06-17 VITALS — BP 127/66 | HR 70 | Resp 18 | Ht 68.0 in | Wt 193.4 lb

## 2019-06-17 DIAGNOSIS — G459 Transient cerebral ischemic attack, unspecified: Secondary | ICD-10-CM

## 2019-06-17 DIAGNOSIS — M19041 Primary osteoarthritis, right hand: Secondary | ICD-10-CM

## 2019-06-17 DIAGNOSIS — M19042 Primary osteoarthritis, left hand: Secondary | ICD-10-CM

## 2019-06-17 DIAGNOSIS — I712 Thoracic aortic aneurysm, without rupture, unspecified: Secondary | ICD-10-CM

## 2019-06-17 DIAGNOSIS — R21 Rash and other nonspecific skin eruption: Secondary | ICD-10-CM

## 2019-06-17 DIAGNOSIS — M19071 Primary osteoarthritis, right ankle and foot: Secondary | ICD-10-CM

## 2019-06-17 DIAGNOSIS — M19072 Primary osteoarthritis, left ankle and foot: Secondary | ICD-10-CM

## 2019-06-17 DIAGNOSIS — I2510 Atherosclerotic heart disease of native coronary artery without angina pectoris: Secondary | ICD-10-CM | POA: Diagnosis not present

## 2019-06-17 DIAGNOSIS — Z8639 Personal history of other endocrine, nutritional and metabolic disease: Secondary | ICD-10-CM | POA: Diagnosis not present

## 2019-06-17 DIAGNOSIS — Z789 Other specified health status: Secondary | ICD-10-CM | POA: Diagnosis not present

## 2019-06-17 DIAGNOSIS — I10 Essential (primary) hypertension: Secondary | ICD-10-CM | POA: Diagnosis not present

## 2019-06-17 NOTE — Patient Instructions (Addendum)
Hand Exercises Hand exercises can be helpful for almost anyone. These exercises can strengthen the hands, improve flexibility and movement, and increase blood flow to the hands. These results can make work and daily tasks easier. Hand exercises can be especially helpful for people who have joint pain from arthritis or have nerve damage from overuse (carpal tunnel syndrome). These exercises can also help people who have injured a hand. Exercises Most of these hand exercises are gentle stretching and motion exercises. It is usually safe to do them often throughout the day. Warming up your hands before exercise may help to reduce stiffness. You can do this with gentle massage or by placing your hands in warm water for 10-15 minutes. It is normal to feel some stretching, pulling, tightness, or mild discomfort as you begin new exercises. This will gradually improve. Stop an exercise right away if you feel sudden, severe pain or your pain gets worse. Ask your health care provider which exercises are best for you. Knuckle bend or "claw" fist 1. Stand or sit with your arm, hand, and all five fingers pointed straight up. Make sure to keep your wrist straight during the exercise. 2. Gently bend your fingers down toward your palm until the tips of your fingers are touching the top of your palm. Keep your big knuckle straight and just bend the small knuckles in your fingers. 3. Hold this position for __________ seconds. 4. Straighten (extend) your fingers back to the starting position. Repeat this exercise 5-10 times with each hand. Full finger fist 1. Stand or sit with your arm, hand, and all five fingers pointed straight up. Make sure to keep your wrist straight during the exercise. 2. Gently bend your fingers into your palm until the tips of your fingers are touching the middle of your palm. 3. Hold this position for __________ seconds. 4. Extend your fingers back to the starting position, stretching every  joint fully. Repeat this exercise 5-10 times with each hand. Straight fist 1. Stand or sit with your arm, hand, and all five fingers pointed straight up. Make sure to keep your wrist straight during the exercise. 2. Gently bend your fingers at the big knuckle, where your fingers meet your hand, and the middle knuckle. Keep the knuckle at the tips of your fingers straight and try to touch the bottom of your palm. 3. Hold this position for __________ seconds. 4. Extend your fingers back to the starting position, stretching every joint fully. Repeat this exercise 5-10 times with each hand. Tabletop 1. Stand or sit with your arm, hand, and all five fingers pointed straight up. Make sure to keep your wrist straight during the exercise. 2. Gently bend your fingers at the big knuckle, where your fingers meet your hand, as far down as you can while keeping the small knuckles in your fingers straight. Think of forming a tabletop with your fingers. 3. Hold this position for __________ seconds. 4. Extend your fingers back to the starting position, stretching every joint fully. Repeat this exercise 5-10 times with each hand. Finger spread 1. Place your hand flat on a table with your palm facing down. Make sure your wrist stays straight as you do this exercise. 2. Spread your fingers and thumb apart from each other as far as you can until you feel a gentle stretch. Hold this position for __________ seconds. 3. Bring your fingers and thumb tight together again. Hold this position for __________ seconds. Repeat this exercise 5-10 times with each hand.   Making circles 1. Stand or sit with your arm, hand, and all five fingers pointed straight up. Make sure to keep your wrist straight during the exercise. 2. Make a circle by touching the tip of your thumb to the tip of your index finger. 3. Hold for __________ seconds. Then open your hand wide. 4. Repeat this motion with your thumb and each finger on your hand.  Repeat this exercise 5-10 times with each hand. Thumb motion 1. Sit with your forearm resting on a table and your wrist straight. Your thumb should be facing up toward the ceiling. Keep your fingers relaxed as you move your thumb. 2. Lift your thumb up as high as you can toward the ceiling. Hold for __________ seconds. 3. Bend your thumb across your palm as far as you can, reaching the tip of your thumb for the small finger (pinkie) side of your palm. Hold for __________ seconds. Repeat this exercise 5-10 times with each hand. Grip strengthening  1. Hold a stress ball or other soft ball in the middle of your hand. 2. Slowly increase the pressure, squeezing the ball as much as you can without causing pain. Think of bringing the tips of your fingers into the middle of your palm. All of your finger joints should bend when doing this exercise. 3. Hold your squeeze for __________ seconds, then relax. Repeat this exercise 5-10 times with each hand. Contact a health care provider if:  Your hand pain or discomfort gets much worse when you do an exercise.  Your hand pain or discomfort does not improve within 2 hours after you exercise. If you have any of these problems, stop doing these exercises right away. Do not do them again unless your health care provider says that you can. Get help right away if:  You develop sudden, severe hand pain or swelling. If this happens, stop doing these exercises right away. Do not do them again unless your health care provider says that you can. This information is not intended to replace advice given to you by your health care provider. Make sure you discuss any questions you have with your health care provider. Document Revised: 05/09/2018 Document Reviewed: 01/17/2018 [employer] Patient Education  2020 [employer] Back Exercises The following exercises strengthen the muscles that help to support the trunk and back. They also help to keep the lower back  flexible. Doing these exercises can help to prevent back pain or lessen existing pain.  If you have back pain or discomfort, try doing these exercises 2-3 times each day or as told by your health care provider.  As your pain improves, do them once each day, but increase the number of times that you repeat the steps for each exercise (do more repetitions).  To prevent the recurrence of back pain, continue to do these exercises once each day or as told by your health care provider. Do exercises exactly as told by your health care provider and adjust them as directed. It is normal to feel mild stretching, pulling, tightness, or discomfort as you do these exercises, but you should stop right away if you feel sudden pain or your pain gets worse. Exercises Single knee to chest Repeat these steps 3-5 times for each leg: 1. Lie on your back on a firm bed or the floor with your legs extended. 2. Bring one knee to your chest. Your other leg should stay extended and in contact with the floor. 3. Hold your knee in place by grabbing   your knee or thigh with both hands and hold. 4. Pull on your knee until you feel a gentle stretch in your lower back or buttocks. 5. Hold the stretch for 10-30 seconds. 6. Slowly release and straighten your leg. Pelvic tilt Repeat these steps 5-10 times: 1. Lie on your back on a firm bed or the floor with your legs extended. 2. Bend your knees so they are pointing toward the ceiling and your feet are flat on the floor. 3. Tighten your lower abdominal muscles to press your lower back against the floor. This motion will tilt your pelvis so your tailbone points up toward the ceiling instead of pointing to your feet or the floor. 4. With gentle tension and even breathing, hold this position for 5-10 seconds. Cat-cow Repeat these steps until your lower back becomes more flexible: 1. Get into a hands-and-knees position on a firm surface. Keep your hands under your shoulders, and  keep your knees under your hips. You may place padding under your knees for comfort. 2. Let your head hang down toward your chest. Contract your abdominal muscles and point your tailbone toward the floor so your lower back becomes rounded like the back of a cat. 3. Hold this position for 5 seconds. 4. Slowly lift your head, let your abdominal muscles relax and point your tailbone up toward the ceiling so your back forms a sagging arch like the back of a cow. 5. Hold this position for 5 seconds.  Press-ups Repeat these steps 5-10 times: 1. Lie on your abdomen (face-down) on the floor. 2. Place your palms near your head, about shoulder-width apart. 3. Keeping your back as relaxed as possible and keeping your hips on the floor, slowly straighten your arms to raise the top half of your body and lift your shoulders. Do not use your back muscles to raise your upper torso. You may adjust the placement of your hands to make yourself more comfortable. 4. Hold this position for 5 seconds while you keep your back relaxed. 5. Slowly return to lying flat on the floor.  Bridges Repeat these steps 10 times: 1. Lie on your back on a firm surface. 2. Bend your knees so they are pointing toward the ceiling and your feet are flat on the floor. Your arms should be flat at your sides, next to your body. 3. Tighten your buttocks muscles and lift your buttocks off the floor until your waist is at almost the same height as your knees. You should feel the muscles working in your buttocks and the back of your thighs. If you do not feel these muscles, slide your feet 1-2 inches farther away from your buttocks. 4. Hold this position for 3-5 seconds. 5. Slowly lower your hips to the starting position, and allow your buttocks muscles to relax completely. If this exercise is too easy, try doing it with your arms crossed over your chest. Abdominal crunches Repeat these steps 5-10 times: 1. Lie on your back on a firm bed or  the floor with your legs extended. 2. Bend your knees so they are pointing toward the ceiling and your feet are flat on the floor. 3. Cross your arms over your chest. 4. Tip your chin slightly toward your chest without bending your neck. 5. Tighten your abdominal muscles and slowly raise your trunk (torso) high enough to lift your shoulder blades a tiny bit off the floor. Avoid raising your torso higher than that because it can put too much stress on   your low back and does not help to strengthen your abdominal muscles. 6. Slowly return to your starting position. Back lifts Repeat these steps 5-10 times: 1. Lie on your abdomen (face-down) with your arms at your sides, and rest your forehead on the floor. 2. Tighten the muscles in your legs and your buttocks. 3. Slowly lift your chest off the floor while you keep your hips pressed to the floor. Keep the back of your head in line with the curve in your back. Your eyes should be looking at the floor. 4. Hold this position for 3-5 seconds. 5. Slowly return to your starting position. Contact a health care provider if:  Your back pain or discomfort gets much worse when you do an exercise.  Your worsening back pain or discomfort does not lessen within 2 hours after you exercise. If you have any of these problems, stop doing these exercises right away. Do not do them again unless your health care provider says that you can. Get help right away if:  You develop sudden, severe back pain. If this happens, stop doing the exercises right away. Do not do them again unless your health care provider says that you can. This information is not intended to replace advice given to you by your health care provider. Make sure you discuss any questions you have with your health care provider. Document Revised: 05/23/2018 Document Reviewed: 10/18/2017 [employer] Patient Education  2020 [employer]  

## 2019-07-02 DIAGNOSIS — C61 Malignant neoplasm of prostate: Secondary | ICD-10-CM | POA: Diagnosis not present

## 2019-07-11 DIAGNOSIS — N312 Flaccid neuropathic bladder, not elsewhere classified: Secondary | ICD-10-CM | POA: Diagnosis not present

## 2019-07-11 DIAGNOSIS — C61 Malignant neoplasm of prostate: Secondary | ICD-10-CM | POA: Diagnosis not present

## 2019-07-24 ENCOUNTER — Other Ambulatory Visit: Payer: Self-pay | Admitting: Internal Medicine

## 2019-08-07 ENCOUNTER — Other Ambulatory Visit: Payer: Self-pay | Admitting: Cardiology

## 2019-08-27 NOTE — Progress Notes (Signed)
Follow-up Visit   Date: 08/28/19   Chad Byrd MRN: [id number] DOB: 1928-04-03   Interim History: Chad Byrd is a 86 y.o. right-handed Caucasian male with hypertension, hyperlipidemia, CAD, and intracranial stenosis returning to the clinic with new complaints of dizziness.  He is followed here for TIA secondary to known R M2 stenosis.     UPDATE 08/28/2019:  He is here for acute visit because of new complaints dizziness.  He reports having having sensation lightheadedness with bilateral facial numbness which woke him up and lasted several hours.  There was mild headache, no facial weakness, limb weakness, or dizziness. He further reports waking up several times per week with morning headache.  Pain is usually dull and improves as the day continues.  He does have history of snoring and has not been evaluated for OSA.  He no longer has spells of left arm numbness.    Medications:  Current Outpatient Medications on File Prior to Visit  Medication Sig Dispense Refill  . aspirin EC 81 MG tablet Take 1 tablet (81 mg total) by mouth daily. 90 tablet 3  . EPINEPHrine 0.3 mg/0.3 mL IJ SOAJ injection Inject 0.3 mg into the muscle as needed for anaphylaxis.   2  . finasteride (PROSCAR) 5 MG tablet Take 5 mg by mouth daily.     . isosorbide mononitrate (IMDUR) 30 MG 24 hr tablet TAKE 1 TABLET BY MOUTH EVERY DAY 90 tablet 1  . metoprolol succinate (TOPROL-XL) 25 MG 24 hr tablet TAKE 1 TABLET BY MOUTH EVERY DAY 90 tablet 1  . Multiple Vitamin (MULTI-VITAMIN) tablet Take by mouth.    . NEXLETOL 180 MG TABS TAKE 1 TABLET BY MOUTH DAILY 90 tablet 1  . nitroGLYCERIN (NITROSTAT) 0.4 MG SL tablet Place 0.4 mg under the tongue every 5 (five) minutes as needed for chest pain.    . rivaroxaban (XARELTO) 20 MG TABS tablet Take 1 tablet (20 mg total) by mouth daily with supper. 30 tablet 11   No current facility-administered medications on file prior to visit.    Allergies:  Allergies    Allergen Reactions  . Atorvastatin Other (See Comments)    Myalgia and causes extreme drowsiness  . Pravastatin Itching  . Simvastatin Other (See Comments)    Myalgia and extreme drowsiness   . Statins Other (See Comments)    Statins cause muscle aches and extreme drowsiness  . Eliquis [Apixaban] Rash     Vital Signs:  BP (!) 139/71   Pulse 64   Ht 5\' 8"  (1.727 m)   Wt 195 lb (88.5 kg)   SpO2 96%   BMI 29.65 kg/m    General Medical Exam:   General:  Well appearing Eyes/ENT: see cranial nerve examination.   Neck:  No carotid bruits. Respiratory:  Clear to auscultation, good air entry bilaterally.   Cardiac:  Regular rate and rhythm, no murmur.   Ext:  No edema   Neurological Exam: MENTAL STATUS including orientation to time, place, person, recent and remote memory, attention span and concentration, language, and fund of knowledge is normal.  Speech is not dysarthric.  CRANIAL NERVES:  No visual field defects.  Pupils equal round and reactive to light.  Normal conjugate, extra-ocular eye movements in all directions of gaze.  No ptosis.  Face is symmetric. Facial sensation is normal to temperature and pin prick.  MOTOR:  Motor strength is 5/5 in all extremities.  No pronator drift.   MSRs:  Reflexes are 2+/4 throughout.  SENSORY:  Intact to vibration and temperature throughout.[name]  COORDINATION/GAIT:  Normal finger-to- nose-finger.  Intact rapid alternating movements bilaterally.  Gait narrow based and stable.   Data: MRI brain wo contrast 04/09/2018:  Normal noncontrast MRI head for age.  CTA head and neck 01/10/2017: 1. No emergent large vessel occlusion or high-grade stenosis. 2. Attenuated vascularity within the distal right MCA distribution.  No proximal or distal occlusion. Moderate narrowing of the distal M1 segment of the right MCA. 3. Bilateral carotid bifurcation atherosclerosis without hemodynamically significant stenosis. 4. Aortic Atherosclerosis  (ICD10-I70.0).  MRI/A head and neck 05/23/2018: 1. Unremarkable appearance of the brain for age. No acute intracranial abnormality. 2. No evidence of major intracranial arterial occlusion or flow limiting proximal stenosis. 3. Unchanged asymmetric right MCA branch vessel irregularity including mild-to-moderate proximal right M2 stenosis. 4. Widely patent cervical carotid arteries. 5. Patent vertebral arteries with mild proximal stenosis on the right.  IMPRESSION/PLAN: 1.  Morning headaches with episode of lightheadedness.  No focal neurological deficits.  History of bifacial paresthesias makes TIA unlikely.  Pt reassured.   - Recommend evaluation for sleep apnea.  He will contact his PCP to have this arranged locally.  - If symptoms get worse or he develops new neurological symptoms, patient knows to contact me and repeat CTA can be ordered  2.  Right M2 intracranial stenosis with history of TIA in 03/2018 manifesting with left face and arm paresthesias  - Previously on plavix + aspirin, and started on xeralto for PE  - Continue xeralto + aspirin 81mg ; if xeralto is stopped, plavix will need to be restarted  - Continue statin therapy and BP meds as per primary  Total time spent reviewing records, interview, history/exam, documentation, and coordination of care on day of encounter:  35 min     Thank you for allowing me to participate in patient's care.  If I can answer any additional questions, I would be pleased to do so.    Sincerely,    Jaimes Eckert K. Posey Pronto, DO

## 2019-08-28 ENCOUNTER — Ambulatory Visit (INDEPENDENT_AMBULATORY_CARE_PROVIDER_SITE_OTHER): Payer: Medicare Other | Admitting: Neurology

## 2019-08-28 ENCOUNTER — Encounter: Payer: Self-pay | Admitting: Neurology

## 2019-08-28 ENCOUNTER — Other Ambulatory Visit: Payer: Self-pay

## 2019-08-28 VITALS — BP 139/71 | HR 64 | Ht 68.0 in | Wt 195.0 lb

## 2019-08-28 DIAGNOSIS — I679 Cerebrovascular disease, unspecified: Secondary | ICD-10-CM

## 2019-08-28 DIAGNOSIS — R42 Dizziness and giddiness: Secondary | ICD-10-CM

## 2019-08-28 DIAGNOSIS — R519 Headache, unspecified: Secondary | ICD-10-CM | POA: Diagnosis not present

## 2019-08-28 DIAGNOSIS — I2510 Atherosclerotic heart disease of native coronary artery without angina pectoris: Secondary | ICD-10-CM

## 2019-08-28 NOTE — Patient Instructions (Signed)
Please follow-up with your primary care doctor to be evaluated for sleep apnea.

## 2019-08-29 ENCOUNTER — Other Ambulatory Visit: Payer: Self-pay | Admitting: Cardiology

## 2019-08-29 MED ORDER — RIVAROXABAN 20 MG PO TABS: 20.0000 mg | ORAL_TABLET | Freq: Every day | ORAL | 11 refills | Status: DC

## 2019-08-29 NOTE — Telephone Encounter (Signed)
Refill sent in per request.  

## 2019-08-29 NOTE — Telephone Encounter (Signed)
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

## 2019-08-29 NOTE — Telephone Encounter (Signed)
*  STAT* If patient is at the pharmacy, call can be transferred to refill team.   1. Which medications need to be refilled? (please list name of each medication and dose if known) rivaroxaban (XARELTO) 20 MG TABS tablet  2. Which pharmacy/location (including street and city if local pharmacy) is medication to be sent to? [id number] - [place], [place] - [address] AT [address]  3. Do they need a 30 day or 90 day supply? 30 day

## 2019-09-01 DIAGNOSIS — G4730 Sleep apnea, unspecified: Secondary | ICD-10-CM | POA: Diagnosis not present

## 2019-09-01 DIAGNOSIS — I25118 Atherosclerotic heart disease of native coronary artery with other forms of angina pectoris: Secondary | ICD-10-CM | POA: Diagnosis not present

## 2019-09-01 DIAGNOSIS — N183 Chronic kidney disease, stage 3 unspecified: Secondary | ICD-10-CM | POA: Diagnosis not present

## 2019-09-01 DIAGNOSIS — Z79899 Other long term (current) drug therapy: Secondary | ICD-10-CM | POA: Diagnosis not present

## 2019-09-01 DIAGNOSIS — I10 Essential (primary) hypertension: Secondary | ICD-10-CM | POA: Diagnosis not present

## 2019-09-18 DIAGNOSIS — I2510 Atherosclerotic heart disease of native coronary artery without angina pectoris: Secondary | ICD-10-CM | POA: Insufficient documentation

## 2019-09-18 DIAGNOSIS — H269 Unspecified cataract: Secondary | ICD-10-CM | POA: Insufficient documentation

## 2019-09-18 DIAGNOSIS — I10 Essential (primary) hypertension: Secondary | ICD-10-CM | POA: Insufficient documentation

## 2019-09-21 NOTE — Progress Notes (Signed)
Cardiology Office Note:    Date:  09/22/2019   ID:  Chad Byrd, DOB December 09, 1928, MRN [id number]  PCP:  Chad Paddy, NP  Cardiologist:  Chad More, MD    Referring MD: Chad Paddy, NP    ASSESSMENT:    1. Thoracic aortic aneurysm without rupture (Chad Byrd)   2. Coronary artery disease involving native coronary artery of native heart without angina pectoris   3. Essential hypertension   4. Mixed hyperlipidemia    PLAN:    In order of problems listed above:  1. Follow-up CTA of chest October I doubt he is progressed enough to require intervention 2. Stable CAD having no anginal discomfort continue medical therapy including aspirin beta-blocker oral nitrates and bempedoic acid. 3. BP at target continue current treatment and given a prescription for clonidine he can take for paroxysms systolic greater than 403 to avoid ED visits 4. Lipids at target continue current treatment, most recent lipid profile 06/10/2019 cholesterol 146 LDL 86 triglycerides 105 HDL 39.   Next appointment: 1 year   Medication Adjustments/Labs and Tests Ordered: Current medicines are reviewed at length with the patient today.  Concerns regarding medicines are outlined above.  No orders of the defined types were placed in this encounter.  Meds ordered this encounter  Medications   cloNIDine (CATAPRES) 0.1 MG tablet    Sig: Take 1 tablet (0.1 mg total) by mouth daily as needed.    Dispense:  30 tablet    Refill:  0    No chief complaint on file.   History of Present Illness:    Chad Byrd is a 86 y.o. male with a hx of pulmonary embolism found incidentally on CT of the chest October 2020,CAD, hyperlipidemia,  statin intolerance aneurysmal dilation of the descending thoracic aorta 49 mm and on repeat imaging 44 mm., and hypertension.  He underwent coronary angiography December 2015 showing severe proximal left circumflex stenosis felt to be  high risk for intervention and was treated  medically.  He underwent a an echocardiogram 10/15/2017 showing an ejection fraction of 50-55 percent there is no segmental LV dysfunction and  mild to moderate aortic regurgitation.   He was seen by me 04/08/2018 when he presented with a focal TIA and was referred to [facility] ED for stroke evaluation.  He did not sustain a stroke and subsequently followed up with neurology on 04/11/2088.  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   He was seen 05/20/2018 and declined lipid-lowering treatment with PCSK9 due to cost.  He was last seen 02/05/2019.  Compliance with diet, lifestyle and medications: Yes  He has intermittent episodes of systolics greater than 474 be given prescription for clonidine to use.[name]  He has had no recurrent vertigo.  I reviewed the EKG from his hospital visit April 4 which showed sinus rhythm left anterior hemiblock.  Otherwise is done well no palpitation chest pain shortness of breath or edema.  He complains of a rash in his groin it is fungal and I told him to use Lotrimin spray.  He complains of pruritus which is chronic may be related to his anticoagulant.  His pulmonary embolism was close to 3 years ago complains of rash I suspect Xarelto is the culprit and will discontinue. Past Medical History:  Diagnosis Date   Adverse reaction to drug that acts primarily on skin 10/09/2018   CAD (coronary artery disease)    Cataract    Coronary artery disease  involving native coronary artery of native heart without angina pectoris 09/29/2015   HTN (hypertension) 01/10/2017   Hyperlipidemia    Hypertension    Statin intolerance 03/07/2016   Thoracic aortic aneurysm without rupture (Chad Byrd) 09/29/2015   TIA (transient ischemic attack) 01/10/2017   IMPRESSION:     Chad Berthold, DO  Neurology  Transient ischemic attack manifesting with left arm > face numbness on 04/08/2018.  He has not had these symptoms recur or new neurological  symptoms.  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  With focal sensory symptoms, purely lacunar syndrome is also considered, however his blood p    Past Surgical History:  Procedure Laterality Date   HERNIA REPAIR  1982   RECTAL SURGERY  mid 1990s    Current Medications: Current Meds  Medication Sig   aspirin EC 81 MG tablet Take 1 tablet (81 mg total) by mouth daily.   EPINEPHrine 0.3 mg/0.3 mL IJ SOAJ injection Inject 0.3 mg into the muscle as needed for anaphylaxis.    finasteride (PROSCAR) 5 MG tablet Take 5 mg by mouth daily.    isosorbide mononitrate (IMDUR) 30 MG 24 hr tablet TAKE 1 TABLET BY MOUTH EVERY DAY   metoprolol succinate (TOPROL-XL) 25 MG 24 hr tablet TAKE 1 TABLET BY MOUTH EVERY DAY   Multiple Vitamin (MULTI-VITAMIN) tablet Take by mouth.   NEXLETOL 180 MG TABS TAKE 1 TABLET BY MOUTH DAILY   nitroGLYCERIN (NITROSTAT) 0.4 MG SL tablet Place 0.4 mg under the tongue every 5 (five) minutes as needed for chest pain.   rivaroxaban (XARELTO) 20 MG TABS tablet Take 1 tablet (20 mg total) by mouth daily with supper.   XARELTO 20 MG TABS tablet TAKE 1 TABLET(20 MG) BY MOUTH DAILY WITH SUPPER     Allergies:   Atorvastatin, Pravastatin, Simvastatin, Statins, and Eliquis [apixaban]   Social History   Socioeconomic History   Marital status: Divorced    Spouse name: Chad Byrd = sign. 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 He lives with his girlfriend of 34 years, Chad Byrd.    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   Social Determinants of Health   Financial Resource Strain:    Difficulty of Paying Living Expenses: Not on file  Food Insecurity:    Worried About Charity fundraiser in the Last Year: Not on file   [employer] of Food in the Last Year: Not on file  Transportation Needs:    Lack of Transportation (Medical): Not on file   Lack of Transportation (Non-Medical): Not on file  Physical Activity:    Days of Exercise per Week: Not on file   Minutes of Exercise per Session: Not on file  Stress:    Feeling of Stress : Not on file  Social Connections:  Frequency of Communication with Friends and Family: Not on file   Frequency of Social Gatherings with Friends and Family: Not on file   Attends Religious Services: Not on file   Active Member of Clubs or Organizations: Not on file   Attends Archivist Meetings: Not on file   Marital Status: Not on file     Family History: The patient's family history includes Alzheimer's disease in his brother; Lung cancer in his sister. ROS:   Please see the history of present illness.    All other systems reviewed and are negative.  EKGs/Labs/Other Studies Reviewed:    The following studies were reviewed today:   Recent Labs: 02/05/2019: ALT 26 03/18/2019: TSH 2.070 05/04/2019: BUN 26; Creatinine, Ser 1.30; Hemoglobin 15.3; Platelets 197; Potassium 4.1; Sodium 141  Recent Lipid Panel    Component Value Date/Time   CHOL 153 02/05/2019 1511   CHOL 203 (H) 09/29/2015 1039   TRIG 180 (H) 02/05/2019 1511   TRIG 127 09/29/2015 1039   HDL 35 (L) 02/05/2019 1511   HDL 36 (L) 09/29/2015 1039   CHOLHDL 4.4 02/20/2018 0859   CHOLHDL 4.0 01/11/2017 0444   VLDL 30 01/11/2017 0444   LDLCALC 87 02/05/2019 1511   LDLCALC 142 (H) 09/29/2015 1039    Physical Exam:    VS:  BP (!) 144/74    Pulse 72    Ht  5\' 8"  (1.727 m)    Wt 194 lb (88 kg)    SpO2 93%    BMI 29.50 kg/m     Wt Readings from Last 3 Encounters:  09/22/19 194 lb (88 kg)  08/28/19 195 lb (88.5 kg)  06/17/19 193 lb 6.4 oz (87.7 kg)     GEN:  Well nourished, well developed in no acute distress HEENT: Normal NECK: No JVD; No carotid bruits LYMPHATICS: No lymphadenopathy CARDIAC: RRR, no murmurs, rubs, gallops RESPIRATORY:  Clear to auscultation without rales, wheezing or rhonchi  ABDOMEN: Soft, non-tender, non-distended MUSCULOSKELETAL:  No edema; No deformity  SKIN: Warm and dry NEUROLOGIC:  Alert and oriented x 3 PSYCHIATRIC:  Normal affect    Signed, Chad More, MD  09/22/2019 4:30 PM    [facility]

## 2019-09-22 ENCOUNTER — Encounter: Payer: Self-pay | Admitting: Cardiology

## 2019-09-22 ENCOUNTER — Ambulatory Visit (INDEPENDENT_AMBULATORY_CARE_PROVIDER_SITE_OTHER): Payer: Medicare Other | Admitting: Cardiology

## 2019-09-22 ENCOUNTER — Other Ambulatory Visit: Payer: Self-pay

## 2019-09-22 VITALS — BP 144/74 | HR 72 | Ht 68.0 in | Wt 194.0 lb

## 2019-09-22 DIAGNOSIS — E782 Mixed hyperlipidemia: Secondary | ICD-10-CM | POA: Diagnosis not present

## 2019-09-22 DIAGNOSIS — I712 Thoracic aortic aneurysm, without rupture, unspecified: Secondary | ICD-10-CM

## 2019-09-22 DIAGNOSIS — I10 Essential (primary) hypertension: Secondary | ICD-10-CM | POA: Diagnosis not present

## 2019-09-22 DIAGNOSIS — I2510 Atherosclerotic heart disease of native coronary artery without angina pectoris: Secondary | ICD-10-CM | POA: Diagnosis not present

## 2019-09-22 MED ORDER — CLONIDINE HCL 0.1 MG PO TABS: 0.1000 mg | ORAL_TABLET | Freq: Every day | ORAL | 0 refills | Status: DC | PRN

## 2019-09-22 NOTE — Patient Instructions (Addendum)
Medication Instructions:  Your physician has recommended you make the following change in your medication:  STOP: Xarelto START: Clonidine 0.1 mg take one tablet by mouth daily as needed if your blood pressure is greater than 165 on top.   Please get some over the counter Lotrimin spray for fungal infection in your groin.  *If you need a refill on your cardiac medications before your next appointment, please call your pharmacy*   Lab Work: None If you have labs (blood work) drawn today and your tests are completely normal, you will receive your results only by:  [facility] (if you have MyChart) OR  A paper copy in the mail If you have any lab test that is abnormal or we need to change your treatment, we will call you to review the results.   Testing/Procedures: We have put in the order for you to have a chest CTA completed in October to assess your thoracic aortic aneurysm. We will call you with this test scheduled closer to October. You will need to have labs completed prior to this test. Please come into our office anytime during the first week in October to get these labs completed.   Follow-Up: At [facility], you and your health needs are our priority.  As part of our continuing mission to provide you with exceptional heart care, we have created designated Provider Care Teams.  These Care Teams include your primary Cardiologist (physician) and Advanced Practice Providers (APPs -  Physician Assistants and Nurse Practitioners) who all work together to provide you with the care you need, when you need it.  We recommend signing up for the patient portal called "MyChart".  Sign up information is provided on this After Visit Summary.  MyChart is used to connect with patients for Virtual Visits (Telemedicine).  Patients are able to view lab/test results, encounter notes, upcoming appointments, etc.  Non-urgent messages can be sent to your provider as well.   To learn more about what  you can do with MyChart, go to [email or web address].    Your next appointment:   1 year  The format for your next appointment:   In Person  Provider:   Shirlee More, MD   Other Instructions

## 2019-09-26 DIAGNOSIS — G4733 Obstructive sleep apnea (adult) (pediatric): Secondary | ICD-10-CM | POA: Diagnosis not present

## 2019-09-26 DIAGNOSIS — R0602 Shortness of breath: Secondary | ICD-10-CM | POA: Diagnosis not present

## 2019-09-27 DIAGNOSIS — G4733 Obstructive sleep apnea (adult) (pediatric): Secondary | ICD-10-CM | POA: Diagnosis not present

## 2019-09-27 DIAGNOSIS — R0602 Shortness of breath: Secondary | ICD-10-CM | POA: Diagnosis not present

## 2019-10-16 IMAGING — MR MRA HEAD WITHOUT CONTRAST
12 of 14 series · 36 of 48 positions shown · IV contrast (gadavist)
Comparison: Head CT 05/23/2018 and MRI 04/09/2018. Head and neck
CTA 01/10/2017.

CLINICAL DATA: Numbness and tingling in the left face and left arm.
Similar transient symptoms in the past.

EXAM:
MR HEAD WITHOUT CONTRAST
MR CIRCLE OF WILLIS WITHOUT CONTRAST
MRA OF THE NECK WITHOUT AND WITH CONTRAST
TECHNIQUE: Multiplanar, multiecho pulse sequences of the brain, circle of
willis and surrounding structures were obtained without intravenous
contrast. Angiographic images of the neck were obtained using MRA
technique without and with intravenous contrast.
CONTRAST:  10 mL Gadavist

[Series 5: DWI · axial · 3.0mm · 0.88mm/px · z∈[-80,+60]mm · 6 of 96 slices shown (1 of 4)]
[im 1/96]
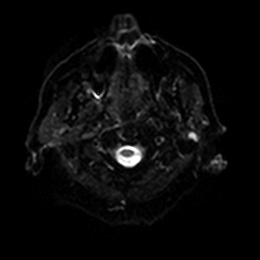
[im 20/96]
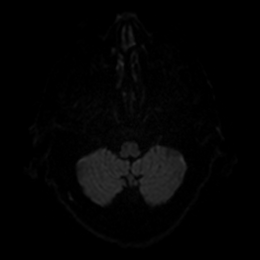
[im 39/96]
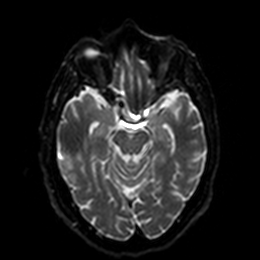
[im 58/96]
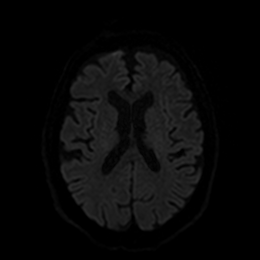
[im 77/96]
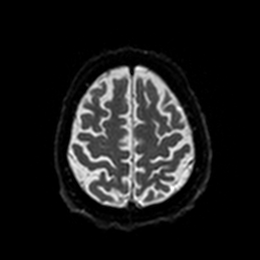
[im 96/96]
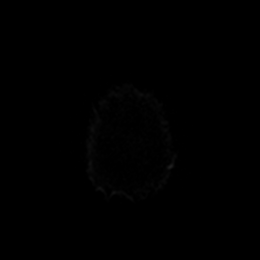

[Series 6: DWI · axial · 3.0mm · 0.88mm/px · z∈[-80,+60]mm · 2 of 47 slices shown (2 of 4)]
[im 1/47]
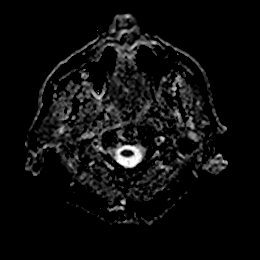
[im 47/47]
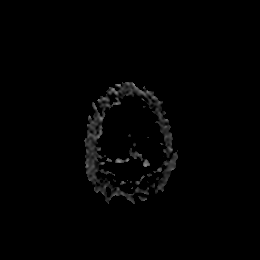

[Series 7: DWI · coronal · 4.0mm · 0.88mm/px · 3 of 64 slices shown (3 of 4)]
[im 1/64]
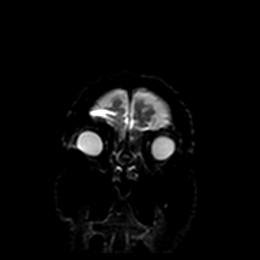
[im 32/64]
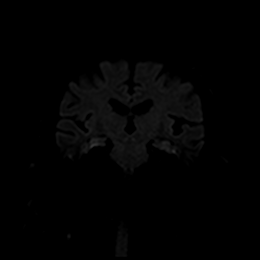
[im 64/64]
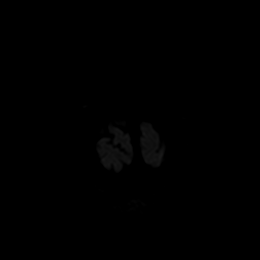

[Series 8: DWI · coronal · 4.0mm · 0.88mm/px · 2 of 32 slices shown (4 of 4)]
[im 1/32]
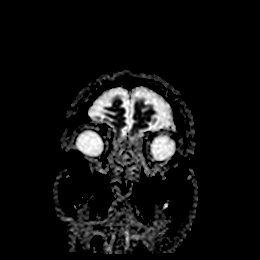
[im 32/32]
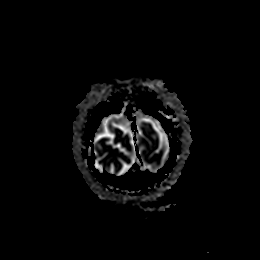

[Series 9: T1 · sagittal · 5.0mm · 0.75mm/px · 1 of 23 slices shown]
[im 1/23]
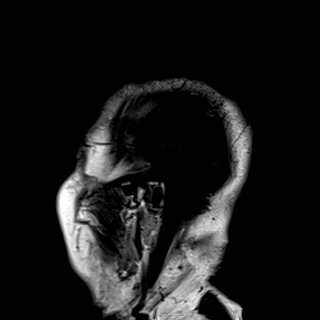

[Series 14: T2 · axial · 5.0mm · 0.72mm/px · 1 of 25 slices shown (1 of 2)]
[im 1/25]
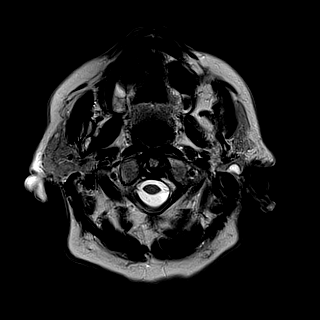

[Series 15: FLAIR · axial · 5.0mm · 0.45mm/px · 1 of 25 slices shown]
[im 1/25]
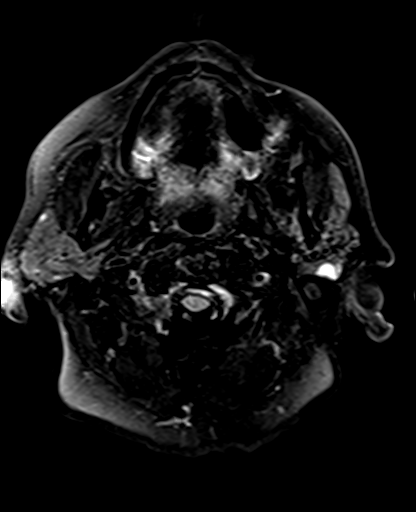

[Series 17: pha_images · axial · 3.0mm · 0.90mm/px · z∈[-84,+65]mm · 2 of 51 slices shown]
[im 1/51]
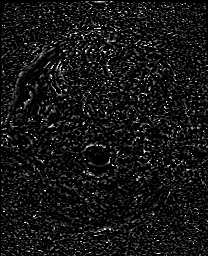
[im 51/51]
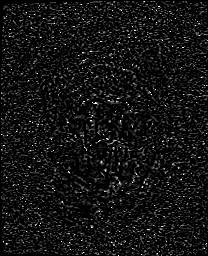

[Series 18: swi_images · axial · 3.0mm · 0.90mm/px · z∈[-86,+65]mm · 2 of 52 slices shown]
[im 1/52]
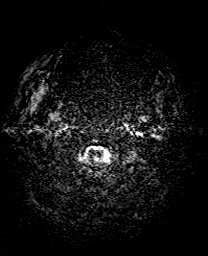
[im 52/52]
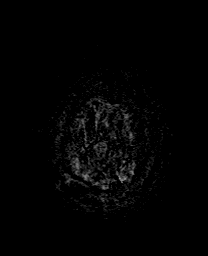

[Series 21: T2 · coronal · 5.0mm · 0.34mm/px · 1 of 29 slices shown (2 of 2)]
[im 1/29]
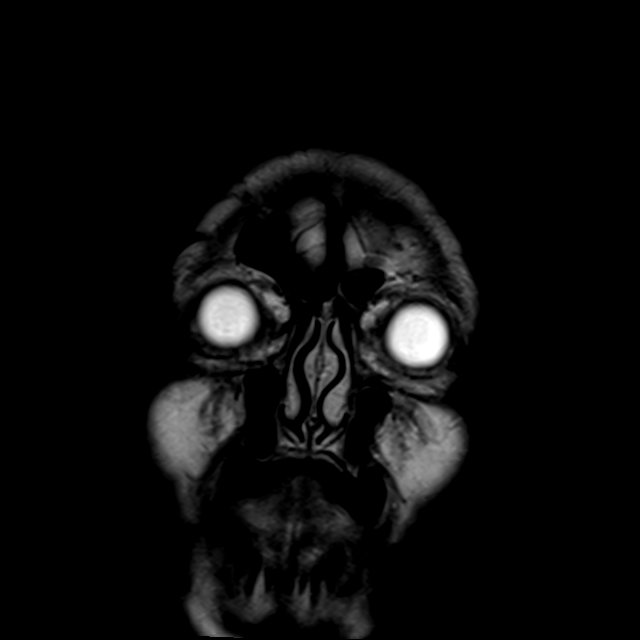

[Series 28: tof_fl3d_tra_iso · axial · 0.6mm · 0.52mm/px · z∈[-209,-74]mm · 11 of 226 slices shown]
[im 1/226]
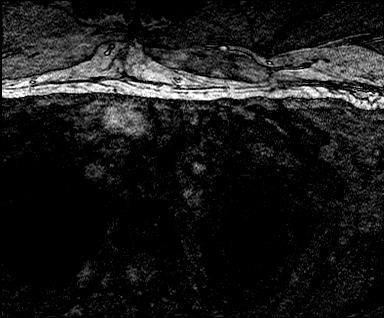
[im 23/226]
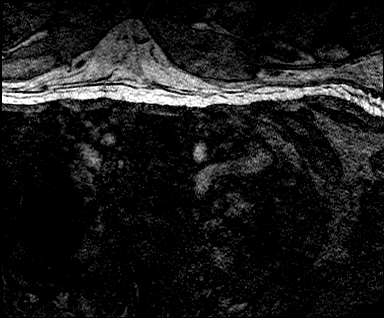
[im 46/226]
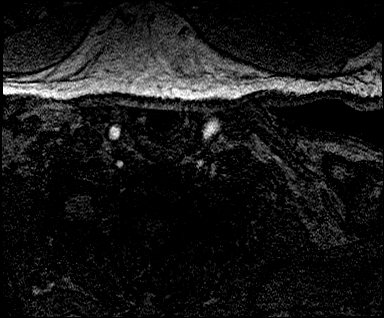
[im 68/226]
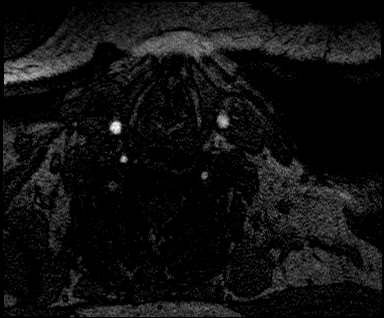
[im 91/226]
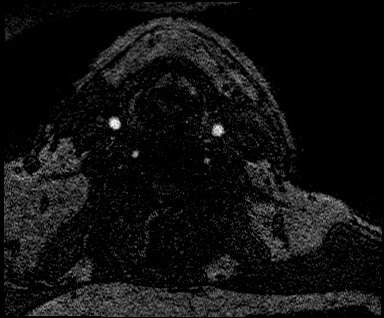
[im 113/226]
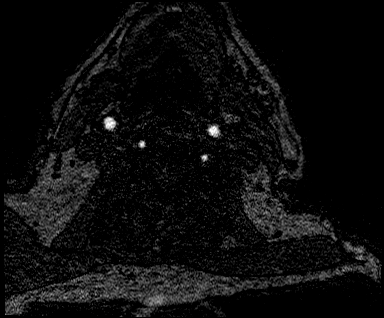
[im 136/226]
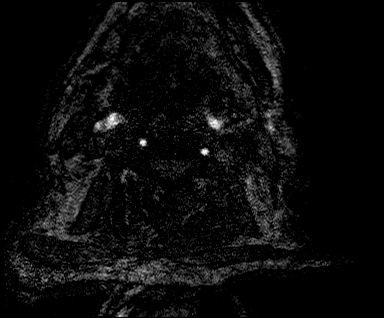
[im 158/226]
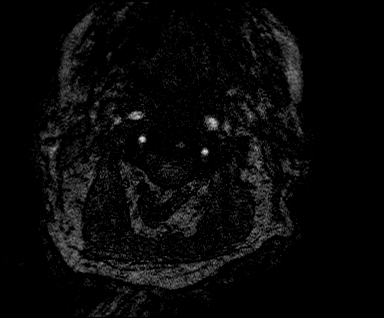
[im 181/226]
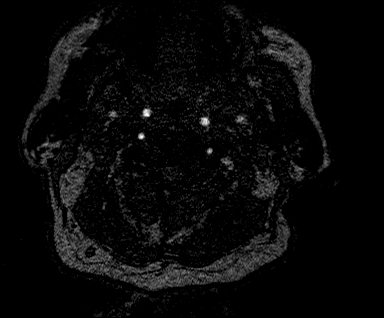
[im 203/226]
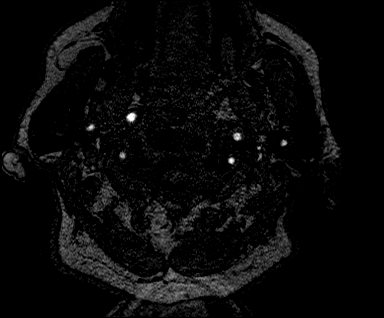
[im 226/226]
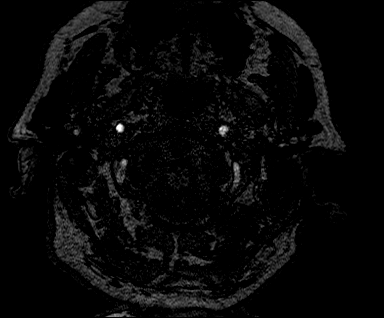

[Series 35: angio_fl3d_cor_post_ttc=3.0s_moco-adv_sub · coronal · 0.9mm · 0.85mm/px · 4 of 80 slices shown]
[im 1/80]
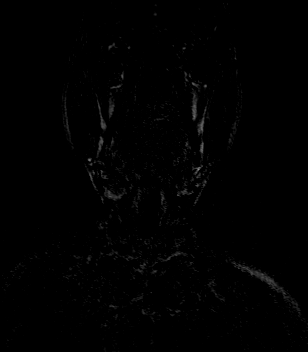
[im 27/80]
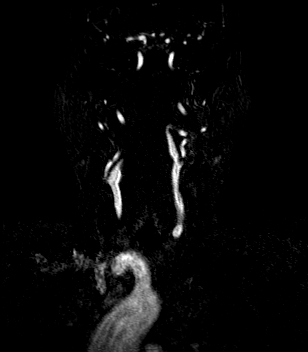
[im 53/80]
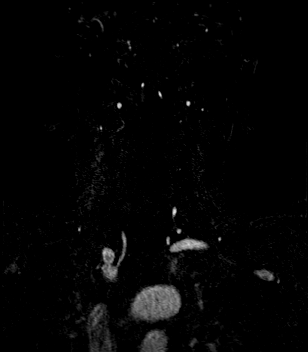
[im 80/80]
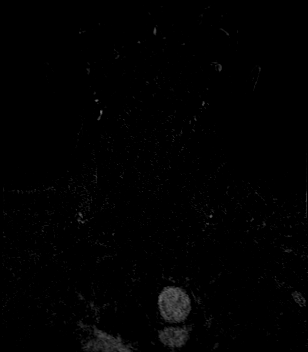

[36 of 48 positions shown; findings below may reference images not displayed]

FINDINGS: MR HEAD FINDINGS

Brain: There is no evidence of acute infarct, intracranial
hemorrhage, mass, midline shift, or extra-axial fluid collection.
The ventricles and sulci are within normal limits for age. Several
scattered punctate foci of T2 hyperintensity in the cerebral white
matter are unchanged from the prior MRI and also within normal
limits for age.

Vascular: Major intracranial vascular flow voids are preserved.

Skull and upper cervical spine: Unremarkable bone marrow signal.

Sinuses/Orbits: Bilateral cataract extraction. Chronic right
sphenoid sinusitis. Clear mastoid air cells.

Other: Unchanged 10 mm T2 hyperintense lesion in the left parotid
gland.

MR CIRCLE OF WILLIS FINDINGS

The study is mildly motion degraded.

The visualized distal vertebral arteries are patent to the basilar.
Grossly patent PICA and SCA origins are seen bilaterally. The
basilar artery is patent with apparent mild mid basilar stenosis
felt to be artifactual given normal appearance on the
contrast-enhanced neck MRA and on the prior CTA. There is a fetal
origin of the right PCA. Both PCAs are patent without evidence of
significant proximal stenosis.

The internal carotid arteries are patent from skull base to carotid
termini with mild siphon irregularity but no significant stenosis.
ACAs and MCAs are patent without significant A1 or M1 stenosis.
There is asymmetric irregularity of right MCA branch vessels within
unchanged mild-to-moderate proximal right M2 stenosis noted. No
aneurysm is identified.

MRA NECK FINDINGS

The study is mildly motion degraded.

There is a standard 3 vessel aortic arch with widely patent
brachiocephalic and subclavian arteries. The carotid arteries are
patent without evidence of significant stenosis or dissection. The
mid left cervical ICA is tortuous.

The vertebral arteries are patent and codominant with antegrade flow
bilaterally. There is likely mild right vertebral artery origin
stenosis with otherwise unremarkable appearance of the vertebral
arteries bilaterally.
IMPRESSION: 1. Unremarkable appearance of the brain for age. No acute
intracranial abnormality.
2. No evidence of major intracranial arterial occlusion or flow
limiting proximal stenosis.
3. Unchanged asymmetric right MCA branch vessel irregularity
including mild-to-moderate proximal right M2 stenosis.
4. Widely patent cervical carotid arteries.
5. Patent vertebral arteries with mild proximal stenosis on the
right.

## 2019-10-20 ENCOUNTER — Telehealth: Payer: Self-pay | Admitting: Cardiology

## 2019-10-20 DIAGNOSIS — I712 Thoracic aortic aneurysm, without rupture, unspecified: Secondary | ICD-10-CM

## 2019-10-20 NOTE — Telephone Encounter (Signed)
   [name] calling, she is asking when is pt's CT is or what is the pt's BMET for, she said if she unable to answer to leave her a detailed message

## 2019-10-20 NOTE — Telephone Encounter (Signed)
Pt was to have a repeat CT in October.. will forward to Dr. Bettina Gavia to be sure okay to order and proceed with scheduling.   From 09/22/19 note:  Follow-up CTA of chest October I doubt he is progressed enough to require intervention  Last CT 11/26/2018.

## 2019-10-21 NOTE — Telephone Encounter (Signed)
Please schedule chest CTA October regarding thoracic aortic aneurysm I will probably want to do it at [facility]

## 2019-10-23 NOTE — Telephone Encounter (Signed)
Orders in for CT, Message sent to precert

## 2019-10-23 NOTE — Addendum Note (Signed)
Addended by: Mendel Ryder on: 10/23/2019 09:14 AM   Modules accepted: Orders

## 2019-11-05 DIAGNOSIS — I712 Thoracic aortic aneurysm, without rupture: Secondary | ICD-10-CM | POA: Diagnosis not present

## 2019-11-06 ENCOUNTER — Other Ambulatory Visit: Payer: Self-pay | Admitting: Cardiology

## 2019-11-06 ENCOUNTER — Telehealth: Payer: Self-pay

## 2019-11-06 DIAGNOSIS — Z23 Encounter for immunization: Secondary | ICD-10-CM | POA: Diagnosis not present

## 2019-11-06 DIAGNOSIS — I712 Thoracic aortic aneurysm, without rupture, unspecified: Secondary | ICD-10-CM

## 2019-11-06 LAB — BASIC METABOLIC PANEL
BUN/Creatinine Ratio: 16 (ref 10–24)
BUN: 24 mg/dL (ref 8–27)
CO2: 26 mmol/L (ref 20–29)
Calcium: 9.8 mg/dL (ref 8.6–10.2)
Chloride: 102 mmol/L (ref 96–106)
Creatinine, Ser: 1.48 mg/dL — ABNORMAL HIGH (ref 0.76–1.27)
GFR calc Af Amer: 48 mL/min/{1.73_m2} — ABNORMAL LOW (ref >59)
GFR calc non Af Amer: 42 mL/min/{1.73_m2} — ABNORMAL LOW (ref >59)
Glucose: 103 mg/dL — ABNORMAL HIGH (ref 65–99)
Potassium: 4.5 mmol/L (ref 3.5–5.2)
Sodium: 143 mmol/L (ref 134–144)

## 2019-11-06 NOTE — Telephone Encounter (Signed)
Spoke with patient regarding results and recommendation.  Patient verbalizes understanding and is agreeable to plan of care. Advised patient to call back with any issues or concerns.  

## 2019-11-24 DIAGNOSIS — L570 Actinic keratosis: Secondary | ICD-10-CM | POA: Diagnosis not present

## 2019-11-24 DIAGNOSIS — L300 Nummular dermatitis: Secondary | ICD-10-CM | POA: Diagnosis not present

## 2019-11-24 DIAGNOSIS — L578 Other skin changes due to chronic exposure to nonionizing radiation: Secondary | ICD-10-CM | POA: Diagnosis not present

## 2019-11-26 ENCOUNTER — Encounter (HOSPITAL_BASED_OUTPATIENT_CLINIC_OR_DEPARTMENT_OTHER): Payer: Self-pay

## 2019-11-26 ENCOUNTER — Ambulatory Visit (HOSPITAL_BASED_OUTPATIENT_CLINIC_OR_DEPARTMENT_OTHER)
Admission: RE | Admit: 2019-11-26 | Discharge: 2019-11-26 | Disposition: A | Discharge status: Home / Self Care | Payer: Medicare Other | Source: Ambulatory Visit | Attending: Cardiology | Admitting: Cardiology

## 2019-11-26 ENCOUNTER — Other Ambulatory Visit: Payer: Self-pay

## 2019-11-26 DIAGNOSIS — R911 Solitary pulmonary nodule: Secondary | ICD-10-CM | POA: Diagnosis not present

## 2019-11-26 DIAGNOSIS — I712 Thoracic aortic aneurysm, without rupture, unspecified: Secondary | ICD-10-CM

## 2019-11-26 DIAGNOSIS — J9811 Atelectasis: Secondary | ICD-10-CM | POA: Diagnosis not present

## 2019-11-26 MED ORDER — IOHEXOL 350 MG/ML SOLN
100.0000 mL | Freq: Once | INTRAVENOUS | Status: AC | PRN
  Administered 2019-11-26: 80 mL via INTRAVENOUS

## 2019-11-27 ENCOUNTER — Telehealth: Payer: Self-pay

## 2019-11-27 NOTE — Telephone Encounter (Signed)
Spoke with patient regarding results and recommendation.  Patient verbalizes understanding and is agreeable to plan of care. Advised patient to call back with any issues or concerns.  

## 2019-12-06 DIAGNOSIS — Z23 Encounter for immunization: Secondary | ICD-10-CM | POA: Diagnosis not present

## 2020-02-02 ENCOUNTER — Other Ambulatory Visit: Payer: Self-pay | Admitting: Cardiology

## 2020-02-04 DIAGNOSIS — C61 Malignant neoplasm of prostate: Secondary | ICD-10-CM | POA: Diagnosis not present

## 2020-02-11 DIAGNOSIS — C61 Malignant neoplasm of prostate: Secondary | ICD-10-CM | POA: Diagnosis not present

## 2020-02-11 DIAGNOSIS — N312 Flaccid neuropathic bladder, not elsewhere classified: Secondary | ICD-10-CM | POA: Diagnosis not present

## 2020-02-18 ENCOUNTER — Telehealth: Payer: Self-pay | Admitting: Pharmacist

## 2020-02-18 ENCOUNTER — Telehealth: Payer: Self-pay | Admitting: Cardiology

## 2020-02-18 NOTE — Telephone Encounter (Signed)
Pt's partner called into clinic inquiring about Nexletol pt assistance. He was previously in the [employer] but this has expired. Successfully re-enrolled pt over the phone so that Nexletol will continue to be free. Updated info that pharmacy requires has been sent to pt via MyChart message.

## 2020-02-19 ENCOUNTER — Other Ambulatory Visit: Payer: Self-pay | Admitting: Cardiology

## 2020-02-19 NOTE — Telephone Encounter (Signed)
Refill sent to pharmacy.   

## 2020-04-28 DIAGNOSIS — I10 Essential (primary) hypertension: Secondary | ICD-10-CM | POA: Diagnosis not present

## 2020-04-28 DIAGNOSIS — I25118 Atherosclerotic heart disease of native coronary artery with other forms of angina pectoris: Secondary | ICD-10-CM | POA: Diagnosis not present

## 2020-04-28 DIAGNOSIS — E7800 Pure hypercholesterolemia, unspecified: Secondary | ICD-10-CM | POA: Diagnosis not present

## 2020-05-10 ENCOUNTER — Telehealth: Payer: Self-pay | Admitting: Cardiology

## 2020-05-10 NOTE — Progress Notes (Deleted)
Cardiology Office Note:    Date:  05/10/2020   ID:  Chad Byrd, DOB September 11, 1928, MRN [id number]  PCP:  Chad Paddy, NP  Cardiologist:  Chad More, MD    Referring MD: Chad Paddy, NP    ASSESSMENT:    No diagnosis found. PLAN:    In order of problems listed above:  1. ***   Next appointment: ***   Medication Adjustments/Labs and Tests Ordered: Current medicines are reviewed at length with the patient today.  Concerns regarding medicines are outlined above.  No orders of the defined types were placed in this encounter.  No orders of the defined types were placed in this encounter.   No chief complaint on file.   History of Present Illness:    Chad Byrd is a 88 y.o. male with a hx of pulmonary embolism found incidentally on CT of his thoracic aorta October 2020, CAD hyperlipidemia aneurysmal dilatation of the ascending aorta and hypertension.  He underwent coronary angiography in December 2015 shows severe proximal left circumflex stenosis felt to be high risk for intervention was treated medically.  He was seen Chad Byrd March 2020 when he had a focal TIA with intracranial stenosis of the right middle cerebral M1 artery.  He was last seen 09/22/2019. Compliance with diet, lifestyle and medications: *** Past Medical History:  Diagnosis Date  . Adverse reaction to drug that acts primarily on skin 10/09/2018  . CAD (coronary artery disease)   . Cataract   . Coronary artery disease involving native coronary artery of native heart without angina pectoris 09/29/2015  . HTN (hypertension) 01/10/2017  . Hyperlipidemia   . Hypertension   . Statin intolerance 03/07/2016  . Thoracic aortic aneurysm without rupture ([place]) 09/29/2015  . TIA (transient ischemic attack) 01/10/2017   IMPRESSION:     Chad Berthold, DO  Neurology  Transient ischemic attack manifesting with left arm > face numbness on 04/08/2018.  He has not had these symptoms recur or new  neurological symptoms.  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  With focal sensory symptoms, purely lacunar syndrome is also considered, however his blood p    Past Surgical History:  Procedure Laterality Date  . HERNIA REPAIR  1982  . RECTAL SURGERY  mid 1990s    Current Medications: No outpatient medications have been marked as taking for the 05/11/20 encounter (Appointment) with Chad Priest, MD.     Allergies:   Atorvastatin, Pravastatin, Simvastatin, Statins, and Eliquis [apixaban]   Social History   Socioeconomic History  . Marital status: Divorced    Spouse name: Chad Byrd = sign. other  . Number of children: 6  . Years of education: 84  . Highest education level: Not on file  Occupational History  . 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 Smoking status: Never Smoker  . Smokeless tobacco: Never Used  Vaping Use  . Vaping Use: Never used  Substance and Sexual Activity  . Alcohol use: Not Currently  . Drug use: No  . Sexual activity: Not on file  Other Topics Concern  . Not on file  Social History Narrative   One story house    exercise - walk dogs daily, work on farm      Patent is right-handed. He lives with his girlfriend of 105 years, Chad Byrd.    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   Social Determinants of Health  Financial Resource Strain: Not on file  Food Insecurity: Not on file  Transportation Needs: Not on file  Physical Activity: Not on file  Stress: Not on file  Social Connections: Not on file     Family History: The patient's ***family history includes Alzheimer's disease in his brother; Lung cancer in his sister. ROS:   Please see the history of present illness.    All other systems reviewed and are negative.  EKGs/Labs/Other Studies Reviewed:    The following studies were reviewed today:  EKG:  EKG ordered today and personally reviewed.   The ekg ordered today demonstrates ***  Recent Labs: 11/05/2019: BUN 24; Creatinine, Ser 1.48; Potassium 4.5; Sodium 143  Recent Lipid Panel    Component Value Date/Time   CHOL 153 02/05/2019 1511   CHOL 203 (H) 09/29/2015 1039   TRIG 180 (H) 02/05/2019 1511   TRIG 127 09/29/2015 1039   HDL 35 (L) 02/05/2019 1511   HDL 36 (L) 09/29/2015 1039   CHOLHDL 4.4 02/20/2018 0859   CHOLHDL 4.0 01/11/2017 0444   VLDL 30 01/11/2017 0444   LDLCALC 87 02/05/2019 1511   LDLCALC 142 (H) 09/29/2015 1039    Physical Exam:    VS:  There were no vitals taken for this visit.    Wt Readings from Last 3 Encounters:  09/22/19 194 lb (88 kg)  08/28/19 195 lb (88.5 kg)  06/17/19 193 lb 6.4 oz (87.7 kg)     GEN: *** Well nourished, well developed in no acute distress HEENT: Normal NECK: No JVD; No carotid bruits LYMPHATICS: No lymphadenopathy CARDIAC: ***RRR, no murmurs, rubs, gallops RESPIRATORY:  Clear to auscultation without rales, wheezing or rhonchi  ABDOMEN: Soft, non-tender, non-distended MUSCULOSKELETAL:  No edema; No deformity  SKIN: Warm and dry NEUROLOGIC:  Alert and oriented x 3 PSYCHIATRIC:  Normal affect    Signed, Chad More, MD  05/10/2020 1:43 PM    [facility]

## 2020-05-10 NOTE — Telephone Encounter (Signed)
Scheduled patient for 05/11/20 at 11:00 am with Dr. Bettina Gavia

## 2020-05-11 ENCOUNTER — Other Ambulatory Visit: Payer: Self-pay

## 2020-05-11 ENCOUNTER — Encounter: Payer: Medicare Other | Admitting: Cardiology

## 2020-05-11 DIAGNOSIS — I712 Thoracic aortic aneurysm, without rupture, unspecified: Secondary | ICD-10-CM

## 2020-05-12 LAB — CBC
Hematocrit: 45.9 % (ref 37.5–51.0)
Hemoglobin: 15.4 g/dL (ref 13.0–17.7)
MCH: 31 pg (ref 26.6–33.0)
MCHC: 33.6 g/dL (ref 31.5–35.7)
MCV: 93 fL (ref 79–97)
Platelets: 209 10*3/uL (ref 150–450)
RBC: 4.96 x10E6/uL (ref 4.14–5.80)
RDW: 12.3 % (ref 11.6–15.4)
WBC: 6.2 10*3/uL (ref 3.4–10.8)

## 2020-05-12 LAB — BASIC METABOLIC PANEL
BUN/Creatinine Ratio: 18 (ref 10–24)
BUN: 27 mg/dL (ref 8–27)
CO2: 24 mmol/L (ref 20–29)
Calcium: 9.5 mg/dL (ref 8.6–10.2)
Chloride: 101 mmol/L (ref 96–106)
Creatinine, Ser: 1.47 mg/dL — ABNORMAL HIGH (ref 0.76–1.27)
Glucose: 88 mg/dL (ref 65–99)
Potassium: 4.7 mmol/L (ref 3.5–5.2)
Sodium: 141 mmol/L (ref 134–144)
eGFR: 45 mL/min/{1.73_m2} — ABNORMAL LOW (ref >59)

## 2020-05-18 ENCOUNTER — Ambulatory Visit (HOSPITAL_BASED_OUTPATIENT_CLINIC_OR_DEPARTMENT_OTHER)
Admission: RE | Admit: 2020-05-18 | Discharge: 2020-05-18 | Disposition: A | Discharge status: Home / Self Care | Payer: Medicare Other | Source: Ambulatory Visit | Attending: Cardiology | Admitting: Cardiology

## 2020-05-18 ENCOUNTER — Other Ambulatory Visit: Payer: Self-pay

## 2020-05-18 DIAGNOSIS — I712 Thoracic aortic aneurysm, without rupture, unspecified: Secondary | ICD-10-CM

## 2020-05-18 MED ORDER — IOHEXOL 350 MG/ML SOLN
100.0000 mL | Freq: Once | INTRAVENOUS | Status: AC | PRN
  Administered 2020-05-18: 80 mL via INTRAVENOUS

## 2020-05-19 ENCOUNTER — Telehealth: Payer: Self-pay

## 2020-05-19 NOTE — Telephone Encounter (Signed)
Spoke with patient regarding results and recommendation.  Patient verbalizes understanding and is agreeable to plan of care. Advised patient to call back with any issues or concerns.  

## 2020-05-19 NOTE — Telephone Encounter (Signed)
-----   Message from Richardo Priest, MD sent at 05/19/2020 10:47 AM EDT ----- Stable good results

## 2020-05-20 DIAGNOSIS — L821 Other seborrheic keratosis: Secondary | ICD-10-CM | POA: Diagnosis not present

## 2020-05-20 DIAGNOSIS — L578 Other skin changes due to chronic exposure to nonionizing radiation: Secondary | ICD-10-CM | POA: Diagnosis not present

## 2020-05-20 DIAGNOSIS — L820 Inflamed seborrheic keratosis: Secondary | ICD-10-CM | POA: Diagnosis not present

## 2020-05-20 DIAGNOSIS — L570 Actinic keratosis: Secondary | ICD-10-CM | POA: Diagnosis not present

## 2020-08-01 ENCOUNTER — Other Ambulatory Visit: Payer: Self-pay | Admitting: Cardiology

## 2020-08-04 NOTE — Telephone Encounter (Signed)
Isosorbide Mononitrate 30 mg # 90 x 1 refill sent to [facility] [place] - [address] AT [place]

## 2020-08-05 DIAGNOSIS — H02834 Dermatochalasis of left upper eyelid: Secondary | ICD-10-CM | POA: Diagnosis not present

## 2020-08-05 DIAGNOSIS — H02831 Dermatochalasis of right upper eyelid: Secondary | ICD-10-CM | POA: Diagnosis not present

## 2020-08-13 ENCOUNTER — Other Ambulatory Visit: Payer: Self-pay | Admitting: Cardiology

## 2020-09-01 DIAGNOSIS — N401 Enlarged prostate with lower urinary tract symptoms: Secondary | ICD-10-CM | POA: Diagnosis not present

## 2020-09-01 DIAGNOSIS — G4733 Obstructive sleep apnea (adult) (pediatric): Secondary | ICD-10-CM | POA: Diagnosis not present

## 2020-09-01 DIAGNOSIS — I25118 Atherosclerotic heart disease of native coronary artery with other forms of angina pectoris: Secondary | ICD-10-CM | POA: Diagnosis not present

## 2020-09-01 DIAGNOSIS — E7800 Pure hypercholesterolemia, unspecified: Secondary | ICD-10-CM | POA: Diagnosis not present

## 2020-09-01 DIAGNOSIS — Z79899 Other long term (current) drug therapy: Secondary | ICD-10-CM | POA: Diagnosis not present

## 2020-09-01 DIAGNOSIS — Z0000 Encounter for general adult medical examination without abnormal findings: Secondary | ICD-10-CM | POA: Diagnosis not present

## 2020-09-01 DIAGNOSIS — I10 Essential (primary) hypertension: Secondary | ICD-10-CM | POA: Diagnosis not present

## 2020-09-03 DIAGNOSIS — C61 Malignant neoplasm of prostate: Secondary | ICD-10-CM | POA: Diagnosis not present

## 2020-09-10 ENCOUNTER — Other Ambulatory Visit: Payer: Self-pay | Admitting: Cardiology

## 2020-09-10 DIAGNOSIS — C61 Malignant neoplasm of prostate: Secondary | ICD-10-CM | POA: Diagnosis not present

## 2020-09-10 DIAGNOSIS — N312 Flaccid neuropathic bladder, not elsewhere classified: Secondary | ICD-10-CM | POA: Diagnosis not present

## 2020-09-22 DIAGNOSIS — Z1211 Encounter for screening for malignant neoplasm of colon: Secondary | ICD-10-CM | POA: Diagnosis not present

## 2020-09-29 DIAGNOSIS — H04221 Epiphora due to insufficient drainage, right lacrimal gland: Secondary | ICD-10-CM | POA: Diagnosis not present

## 2020-09-29 DIAGNOSIS — H02831 Dermatochalasis of right upper eyelid: Secondary | ICD-10-CM | POA: Diagnosis not present

## 2020-10-18 ENCOUNTER — Other Ambulatory Visit: Payer: Self-pay | Admitting: Cardiology

## 2020-10-18 ENCOUNTER — Telehealth: Payer: Self-pay | Admitting: Cardiology

## 2020-10-18 NOTE — Telephone Encounter (Signed)
Pt c/o medication issue:  1. Name of Medication: isosorbide mononitrate (IMDUR) 30 MG 24 hr tablet  2. How are you currently taking this medication (dosage and times per day)? As directed  3. Are you having a reaction (difficulty breathing--STAT)? yes  4. What is your medication issue? Spoke with patient's significant other, [name], she states that Chad Byrd is having a reaction to one of his medications and she believes it is this one. She states that he wakes up in the middle of the night with a rash. She is currently giving him 4 benadryl at night. She would like to know if there is alternate medication he could try to see if this is the cause of the rash. Please advise.

## 2020-10-19 ENCOUNTER — Other Ambulatory Visit: Payer: Self-pay | Admitting: Cardiology

## 2020-10-19 NOTE — Telephone Encounter (Signed)
Spoke to the patient just now and his daughter and let them know Dr. Julien Nordmann recommendations. They verbalize understanding and thanked me for calling back.    Encouraged patient to call back with any questions or concerns.

## 2020-11-11 DIAGNOSIS — Z23 Encounter for immunization: Secondary | ICD-10-CM | POA: Diagnosis not present

## 2020-11-13 ENCOUNTER — Other Ambulatory Visit: Payer: Self-pay | Admitting: Cardiology

## 2020-11-15 NOTE — Progress Notes (Signed)
Cardiology Office Note:    Date:  11/16/2020   ID:  Chad Byrd, DOB 1928/02/26, MRN [id number]  PCP:  Elenore Paddy, NP  Cardiologist:  Shirlee More, MD    Referring MD: Elenore Paddy, NP    ASSESSMENT:    1. Coronary artery disease involving native coronary artery of native heart without angina pectoris   2. Aneurysm of ascending aorta without rupture   3. Mixed hyperlipidemia   4. Statin intolerance    PLAN:    In order of problems listed above:  Stable CAD doing well on medical therapy no angina New York Heart Association class I continue aspirin oral nitrate minimal beta-blocker and lipid-lowering. Recheck lipid profile today At his age and with stable enlargement ascending aorta I think we can stop doing CT scans at this time   Next appointment: 9 months   Medication Adjustments/Labs and Tests Ordered: Current medicines are reviewed at length with the patient today.  Concerns regarding medicines are outlined above.  Orders Placed This Encounter  Procedures   EKG 12-Lead   Meds ordered this encounter  Medications   metoprolol succinate (TOPROL-XL) 25 MG 24 hr tablet    Sig: Take 0.5 tablets (12.5 mg total) by mouth daily. Patient needs an appointment for further refills. 2 nd attempt    Dispense:  45 tablet    Refill:  3    Chief Complaint  Patient presents with   Coronary Artery Disease   Thoracic Aortic Aneurysm   Hypertension   Hyperlipidemia    History of Present Illness:    Chad Byrd is a 87 y.o. male with a hx of coronary artery disease pulmonary embolism found incidentally on CT of the chest October 2020 hyperlipidemia statin intolerance aneurysmal dilation of the ascending thoracic aorta most recently 44 mm hypertension and stroke last seen 09/22/2019.He underwent coronary angiography December 2015 showing severe proximal left circumflex stenosis felt to be  high risk for intervention and was treated medically.   Compliance with  diet, lifestyle and medications: Yes  He continues to farm. He has had no exercise intolerance angina edema shortness of breath palpitation or syncope. He tolerates bempedoic acid without muscle pain or weakness he is statin intolerant.  Chest CTA 05/18/2020 showed stable 46 mm ascending thoracic aortic aneurysm.  He trends blood pressure at home is concerned with diastolics in the 56L pneumogram reduce his beta-blocker dose 50% He has dry skin and itching. Past Medical History:  Diagnosis Date   Adverse reaction to drug that acts primarily on skin 10/09/2018   CAD (coronary artery disease)    Cataract    Coronary artery disease involving native coronary artery of native heart without angina pectoris 09/29/2015   HTN (hypertension) 01/10/2017   Hyperlipidemia    Hypertension    Statin intolerance 03/07/2016   Thoracic aortic aneurysm without rupture 09/29/2015   TIA (transient ischemic attack) 01/10/2017   IMPRESSION:     Chad Berthold, DO  Neurology  Transient ischemic attack manifesting with left arm > face numbness on 04/08/2018.  He has not had these symptoms recur or new neurological symptoms.  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  With focal sensory symptoms, purely lacunar syndrome is also considered, however his blood p    Past Surgical History:  Procedure Laterality Date   HERNIA REPAIR  1982   RECTAL SURGERY  mid 1990s    Current Medications: Current Meds  Medication Sig   aspirin EC 81 MG tablet Take 1 tablet (81 mg total) by mouth daily.   cloNIDine (CATAPRES) 0.1 MG tablet Take 1 tablet (0.1 mg total) by mouth daily as needed.   EPINEPHrine 0.3 mg/0.3 mL IJ SOAJ injection Inject 0.3 mg into the muscle as needed for anaphylaxis.    finasteride (PROSCAR) 5 MG tablet Take 5 mg by mouth daily.    isosorbide mononitrate (IMDUR) 30 MG 24 hr tablet Take 30 mg by mouth daily.   Multiple Vitamin  (MULTI-VITAMIN) tablet Take by mouth.   NEXLETOL 180 MG TABS TAKE 1 TABLET BY MOUTH DAILY   nitroGLYCERIN (NITROSTAT) 0.4 MG SL tablet Place 0.4 mg under the tongue every 5 (five) minutes as needed for chest pain.   [DISCONTINUED] metoprolol succinate (TOPROL-XL) 25 MG 24 hr tablet Take 1 tablet (25 mg total) by mouth daily. Patient needs an appointment for further refills. 2 nd attempt     Allergies:   Atorvastatin, Pravastatin, Simvastatin, Statins, and Eliquis [apixaban]   Social History   Socioeconomic History   Marital status: Divorced    Spouse name: [name] = sign. 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 He lives with his girlfriend of 2 years, [name].    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   Social Determinants of Health   Financial Resource Strain: Not on file  Food Insecurity: Not on file  Transportation Needs: Not on file  Physical Activity: Not on file  Stress: Not on file  Social Connections: Not on file     Family History: The patient's family history includes Alzheimer's disease in his brother; Lung cancer in his sister. ROS:   Please see the history of present illness.    All other systems reviewed and are negative.  EKGs/Labs/Other Studies Reviewed:    The following studies were reviewed today:    Recent Labs: 05/11/2020: BUN 27; Creatinine, Ser 1.47; Hemoglobin 15.4; Platelets 209; Potassium 4.7; Sodium 141  Recent Lipid Panel    Component Value Date/Time    CHOL 153 02/05/2019 1511   CHOL 203 (H) 09/29/2015 1039   TRIG 180 (H) 02/05/2019 1511   TRIG 127 09/29/2015 1039   HDL 35 (L) 02/05/2019 1511   HDL 36 (L) 09/29/2015 1039   CHOLHDL 4.4 02/20/2018 0859   CHOLHDL 4.0 01/11/2017 0444   VLDL 30 01/11/2017 0444   LDLCALC 87 02/05/2019 1511   LDLCALC 142 (H) 09/29/2015 1039    Physical Exam:    VS:  BP (!) 148/70 (BP Location: Right Arm, Patient Position: Sitting)   Pulse 64   Ht 5\' 8"  (1.727 m)   Wt 194 lb 6.4 oz (88.2 kg)   SpO2 99%   BMI 29.56 kg/m     Wt Readings from Last 3 Encounters:  11/16/20 194 lb 6.4 oz (88.2 kg)  09/22/19 194 lb (88 kg)  08/28/19  195 lb (88.5 kg)     GEN: He looks younger than his age well nourished, well developed in no acute distress HEENT: Normal NECK: No JVD; No carotid bruits LYMPHATICS: No lymphadenopathy CARDIAC: RRR, no murmurs, rubs, gallops RESPIRATORY:  Clear to auscultation without rales, wheezing or rhonchi  ABDOMEN: Soft, non-tender, non-distended MUSCULOSKELETAL:  No edema; No deformity  SKIN: Warm and dry NEUROLOGIC:  Alert and oriented x 3 PSYCHIATRIC:  Normal affect    Signed, Shirlee More, MD  11/16/2020 9:56 AM    [place]

## 2020-11-16 ENCOUNTER — Other Ambulatory Visit: Payer: Self-pay

## 2020-11-16 ENCOUNTER — Encounter: Payer: Self-pay | Admitting: Cardiology

## 2020-11-16 ENCOUNTER — Ambulatory Visit (INDEPENDENT_AMBULATORY_CARE_PROVIDER_SITE_OTHER): Payer: Medicare Other | Admitting: Cardiology

## 2020-11-16 VITALS — BP 148/70 | HR 64 | Ht 68.0 in | Wt 194.4 lb

## 2020-11-16 DIAGNOSIS — I7121 Aneurysm of the ascending aorta, without rupture: Secondary | ICD-10-CM

## 2020-11-16 DIAGNOSIS — I2510 Atherosclerotic heart disease of native coronary artery without angina pectoris: Secondary | ICD-10-CM | POA: Diagnosis not present

## 2020-11-16 DIAGNOSIS — Z789 Other specified health status: Secondary | ICD-10-CM

## 2020-11-16 DIAGNOSIS — E782 Mixed hyperlipidemia: Secondary | ICD-10-CM

## 2020-11-16 MED ORDER — METOPROLOL SUCCINATE ER 25 MG PO TB24: 12.5000 mg | ORAL_TABLET | Freq: Every day | ORAL | 3 refills | Status: DC

## 2020-11-16 NOTE — Patient Instructions (Signed)
Medication Instructions:  Your physician has recommended you make the following change in your medication:  DECREASE: TOPROL XL 12.5 mg take 0.5 tablet by mouth daily.  *If you need a refill on your cardiac medications before your next appointment, please call your pharmacy*   Lab Work: None If you have labs (blood work) drawn today and your tests are completely normal, you will receive your results only by: [place] (if you have MyChart) OR A paper copy in the mail If you have any lab test that is abnormal or we need to change your treatment, we will call you to review the results.   Testing/Procedures: None   Follow-Up: At [facility] you and your health needs are our priority.  As part of our continuing mission to provide you with exceptional heart care, we have created designated Provider Care Teams.  These Care Teams include your primary Cardiologist (physician) and Advanced Practice Providers (APPs -  Physician Assistants and Nurse Practitioners) who all work together to provide you with the care you need, when you need it.  We recommend signing up for the patient portal called "MyChart".  Sign up information is provided on this After Visit Summary.  MyChart is used to connect with patients for Virtual Visits (Telemedicine).  Patients are able to view lab/test results, encounter notes, upcoming appointments, etc.  Non-urgent messages can be sent to your provider as well.   To learn more about what you can do with MyChart, go to [email or web address].    Your next appointment:   6 month(s)  The format for your next appointment:   In Person  Provider:   Shirlee More, MD   Other Instructions

## 2020-11-18 DIAGNOSIS — L820 Inflamed seborrheic keratosis: Secondary | ICD-10-CM | POA: Diagnosis not present

## 2020-11-18 DIAGNOSIS — L570 Actinic keratosis: Secondary | ICD-10-CM | POA: Diagnosis not present

## 2020-12-06 DIAGNOSIS — Z20822 Contact with and (suspected) exposure to covid-19: Secondary | ICD-10-CM | POA: Diagnosis not present

## 2020-12-08 DIAGNOSIS — N183 Chronic kidney disease, stage 3 unspecified: Secondary | ICD-10-CM | POA: Diagnosis not present

## 2020-12-08 DIAGNOSIS — I10 Essential (primary) hypertension: Secondary | ICD-10-CM | POA: Diagnosis not present

## 2020-12-08 DIAGNOSIS — J209 Acute bronchitis, unspecified: Secondary | ICD-10-CM | POA: Diagnosis not present

## 2020-12-08 DIAGNOSIS — M1991 Primary osteoarthritis, unspecified site: Secondary | ICD-10-CM | POA: Diagnosis not present

## 2020-12-08 DIAGNOSIS — I25118 Atherosclerotic heart disease of native coronary artery with other forms of angina pectoris: Secondary | ICD-10-CM | POA: Diagnosis not present

## 2020-12-14 DIAGNOSIS — H02831 Dermatochalasis of right upper eyelid: Secondary | ICD-10-CM | POA: Diagnosis not present

## 2020-12-14 DIAGNOSIS — H02102 Unspecified ectropion of right lower eyelid: Secondary | ICD-10-CM | POA: Diagnosis not present

## 2021-01-27 DIAGNOSIS — Z20822 Contact with and (suspected) exposure to covid-19: Secondary | ICD-10-CM | POA: Diagnosis not present

## 2021-02-16 ENCOUNTER — Other Ambulatory Visit: Payer: Self-pay | Admitting: Cardiology

## 2021-02-18 DIAGNOSIS — Z20822 Contact with and (suspected) exposure to covid-19: Secondary | ICD-10-CM | POA: Diagnosis not present

## 2021-02-22 ENCOUNTER — Telehealth: Payer: Self-pay | Admitting: Cardiology

## 2021-02-22 MED ORDER — NEXLETOL 180 MG PO TABS: 1.0000 | ORAL_TABLET | Freq: Every day | ORAL | 1 refills | Status: DC

## 2021-02-22 NOTE — Telephone Encounter (Signed)
Pt c/o medication issue:  1. Name of Medication: NEXLETOL 180 MG TABS  2. How are you currently taking this medication (dosage and times per day)? As directed  3. Are you having a reaction (difficulty breathing--STAT)? no  4. What is your medication issue? Cost   Partner of the patient called. She wanted to know if there was any available Fatima Sanger money available. She has gotten help from [place] for a grant in the past.   $386 was the cost for his rx when he tried to pick it up yesterday. He can not afford that

## 2021-02-22 NOTE — Telephone Encounter (Signed)
Called and spoke w/daughter and provided [facility] Pharmacy Card CARD NO. [id number]   CARD STATUS Active   BIN [id number]   PCN [id number]   PC GROUP [id number]   HELP DESK [id number]   PROVIDER PDMI   PROCESSOR PDMI

## 2021-03-17 ENCOUNTER — Ambulatory Visit (INDEPENDENT_AMBULATORY_CARE_PROVIDER_SITE_OTHER): Payer: Medicare Other | Admitting: Allergy and Immunology

## 2021-03-17 ENCOUNTER — Encounter: Payer: Self-pay | Admitting: Allergy and Immunology

## 2021-03-17 ENCOUNTER — Other Ambulatory Visit: Payer: Self-pay

## 2021-03-17 VITALS — BP 126/68 | HR 64 | Resp 16 | Ht 67.0 in | Wt 198.2 lb

## 2021-03-17 DIAGNOSIS — L299 Pruritus, unspecified: Secondary | ICD-10-CM | POA: Diagnosis not present

## 2021-03-17 MED ORDER — FAMOTIDINE 20 MG PO TABS: 20.0000 mg | ORAL_TABLET | Freq: Two times a day (BID) | ORAL | 5 refills | Status: DC

## 2021-03-17 NOTE — Progress Notes (Signed)
[place]   Follow-up Note  Referring Provider: Elenore Paddy, NP Primary Provider: Elenore Paddy, NP Date of Office Visit: 03/17/2021  Subjective:   Chad Byrd (DOB: [birth year]) is a 88 y.o. male who returns to the [facility] on 03/17/2021 in re-evaluation of the following:  HPI: Chad Byrd returns to this clinic in reevaluation of pruritic disorder.  I last saw him in this clinic during his initial evaluation of 16 December 2018.  Overall it sounds as though he had pretty good control of his pruritic disorder while using cetirizine either 1 or 2 times per day.  He does not develop any sedation while using cetirizine.  But there are intervals of time in which she cannot get his pruritus under good control and he has to add in Benadryl and it does disturb his sleep.  He is in 1 of those intervals of time and he has had a difficult time for the past few weeks regarding this issue.  Since I have seen him in this clinic he has eliminated the use of Eliquis, Plavix, and has minimized his medication use at this point in time.  He does have prostate cancer and is on finasteride.  Allergies as of 03/17/2021       Reactions   Atorvastatin Other (See Comments)   Myalgia and causes extreme drowsiness   Pravastatin Itching   Simvastatin Other (See Comments)   Myalgia and extreme drowsiness   Statins Other (See Comments)   Statins cause muscle aches and extreme drowsiness   Eliquis [apixaban] Rash        Medication List    aspirin EC 81 MG tablet Take 1 tablet (81 mg total) by mouth daily.   Benadryl Allergy 25 MG tablet Generic drug: diphenhydrAMINE Take 25 mg by mouth as needed.   CALCIUM CARBONATE PO Take by mouth.   cetirizine 10 MG tablet Commonly known as: ZYRTEC Take 10 mg by mouth at bedtime.   cloNIDine 0.1 MG tablet Commonly known as: Catapres Take 1 tablet (0.1 mg total) by mouth daily as  needed.   EPINEPHrine 0.3 mg/0.3 mL Soaj injection Commonly known as: EPI-PEN Inject 0.3 mg into the muscle as needed for anaphylaxis.   finasteride 5 MG tablet Commonly known as: PROSCAR Take 5 mg by mouth daily.   isosorbide mononitrate 30 MG 24 hr tablet Commonly known as: IMDUR TAKE 1 TABLET BY MOUTH EVERY DAY   meclizine 25 MG tablet Commonly known as: ANTIVERT Take 25 mg by mouth 3 (three) times daily as needed for dizziness.   metoprolol succinate 25 MG 24 hr tablet Commonly known as: TOPROL-XL Take 0.5 tablets (12.5 mg total) by mouth daily. Patient needs an appointment for further refills. 2 nd attempt   Multi-Vitamin tablet Take by mouth.   Nexletol 180 MG Tabs Generic drug: Bempedoic Acid Take 1 tablet by mouth daily.   nitroGLYCERIN 0.4 MG SL tablet Commonly known as: NITROSTAT Place 0.4 mg under the tongue every 5 (five) minutes as needed for chest pain.   PREVAGEN PO Take by mouth.   vitamin C 1000 MG tablet Take 1,000 mg by mouth.   VITAMIN D3 PO Take by mouth.   Zinc 50 MG Tabs Take by mouth.    Past Medical History:  Diagnosis Date   Adverse reaction to drug that acts primarily on skin 10/09/2018   CAD (coronary artery disease)    Cataract  Coronary artery disease involving native coronary artery of native heart without angina pectoris 09/29/2015   HTN (hypertension) 01/10/2017   Hyperlipidemia    Hypertension    Statin intolerance 03/07/2016   Thoracic aortic aneurysm without rupture 09/29/2015   TIA (transient ischemic attack) 01/10/2017   IMPRESSION:     Chad Berthold, DO  Neurology  Transient ischemic attack manifesting with left arm > face numbness on 04/08/2018.  He has not had these symptoms recur or new neurological symptoms.  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  With focal sensory symptoms, purely lacunar syndrome is also considered, however his blood p    Past  Surgical History:  Procedure Laterality Date   HERNIA REPAIR  1982   RECTAL SURGERY  mid 1990s    Review of systems negative except as noted in HPI / PMHx or noted below:  Review of Systems  Constitutional: Negative.   HENT: Negative.    Eyes: Negative.   Respiratory: Negative.    Cardiovascular: Negative.   Gastrointestinal: Negative.   Genitourinary: Negative.   Musculoskeletal: Negative.   Skin: Negative.   Neurological: Negative.   Endo/Heme/Allergies: Negative.   Psychiatric/Behavioral: Negative.      Objective:   Vitals:   03/17/21 1131  BP: 126/68  Pulse: 64  Resp: 16  SpO2: 96%   Height: 5\' 7"  (170.2 cm)  Weight: 198 lb 3.2 oz (89.9 kg)   Physical Exam Skin:    Findings: No rash (No erythema.  Multiple seborrheic keratoses.).    Diagnostics: none  Assessment and Plan:   1. Pruritic disorder     1. Cetirizine 10 mg - 1-2 tablets 1-2 times per day (MAX=40mg /day)  2. Famotidine 20 mg - 1 tablet 2 times per day  3. Prednisone 10 mg - 1/2 tablet 1 time per day for 10 days only  4. Benadryl if needed  5. Further treatment???    Chad Byrd will increase his dose of cetirizine and use famotidine in combination and I have given him a very short course of steroids today as he is having a flare of his pruritic disorder.  Assuming he does well with this plan we will see him back in this clinic in 1 year or earlier if there is a problem.  Allena Katz, MD Allergy / Immunology [place]

## 2021-03-17 NOTE — Patient Instructions (Addendum)
°  1. Cetirizine 10 mg - 1-2 tablets 1-2 times per day (MAX=40mg /day)  2. Famotidine 20 mg - 1 tablet 2 times per day  3. Prednisone 10 mg - 1/2 tablet 1 time per day for 10 days only  4. Benadryl if needed  5. Further treatment???

## 2021-03-21 ENCOUNTER — Encounter: Payer: Self-pay | Admitting: Allergy and Immunology

## 2021-04-15 DIAGNOSIS — C61 Malignant neoplasm of prostate: Secondary | ICD-10-CM | POA: Diagnosis not present

## 2021-04-22 DIAGNOSIS — N312 Flaccid neuropathic bladder, not elsewhere classified: Secondary | ICD-10-CM | POA: Diagnosis not present

## 2021-04-22 DIAGNOSIS — N401 Enlarged prostate with lower urinary tract symptoms: Secondary | ICD-10-CM | POA: Diagnosis not present

## 2021-04-22 DIAGNOSIS — C61 Malignant neoplasm of prostate: Secondary | ICD-10-CM | POA: Diagnosis not present

## 2021-04-27 DIAGNOSIS — Z20822 Contact with and (suspected) exposure to covid-19: Secondary | ICD-10-CM | POA: Diagnosis not present

## 2021-05-24 DIAGNOSIS — Z20822 Contact with and (suspected) exposure to covid-19: Secondary | ICD-10-CM | POA: Diagnosis not present

## 2021-05-26 DIAGNOSIS — Z20822 Contact with and (suspected) exposure to covid-19: Secondary | ICD-10-CM | POA: Diagnosis not present

## 2021-08-15 ENCOUNTER — Telehealth: Payer: Self-pay | Admitting: Cardiology

## 2021-08-15 ENCOUNTER — Telehealth: Payer: Self-pay

## 2021-08-15 NOTE — Telephone Encounter (Signed)
Sig. Other reported last Thursday the pt felt lightheaded and his BP and pulse rate was low. He was fine over the weekend. Today he has been up and out and he is feeling lightheaded again. BP 119/57 HR 53, BP 126/56 HR 48. He is on Metoprolol 12.'5mg'$  and Imdur '30mg'$ . Please advise. Spoke with Dr. Bettina Gavia, He advised to have the pt reduce his Metoprolol 12.'5mg'$  to every other day and continue to check his blood pressure and HR. Spoke with [name], She verbalized understanding and had no further questions or concerns.

## 2021-08-15 NOTE — Telephone Encounter (Signed)
STAT if HR is under 50 or over 120 (normal HR is 60-100 beats per minute)  What is your heart rate? 53  Do you have a log of your heart rate readings (document readings)? Been running in low 50's since Thursday   Do you have any other symptoms? Dizziness    BP this morning around 10:00 AM was 119/57 HR 53.

## 2021-08-15 NOTE — Telephone Encounter (Signed)
Before disconnecting call patient checked BP again that read 126/56 HR 48. Transferring to triage due to HR.

## 2021-08-17 NOTE — Telephone Encounter (Signed)
error 

## 2021-08-25 DIAGNOSIS — E785 Hyperlipidemia, unspecified: Secondary | ICD-10-CM | POA: Diagnosis not present

## 2021-09-05 DIAGNOSIS — N183 Chronic kidney disease, stage 3 unspecified: Secondary | ICD-10-CM | POA: Diagnosis not present

## 2021-09-05 DIAGNOSIS — Z0000 Encounter for general adult medical examination without abnormal findings: Secondary | ICD-10-CM | POA: Diagnosis not present

## 2021-09-05 DIAGNOSIS — I25118 Atherosclerotic heart disease of native coronary artery with other forms of angina pectoris: Secondary | ICD-10-CM | POA: Diagnosis not present

## 2021-09-05 DIAGNOSIS — I10 Essential (primary) hypertension: Secondary | ICD-10-CM | POA: Diagnosis not present

## 2021-10-15 ENCOUNTER — Other Ambulatory Visit: Payer: Self-pay | Admitting: Cardiology

## 2021-10-26 DIAGNOSIS — C61 Malignant neoplasm of prostate: Secondary | ICD-10-CM | POA: Diagnosis not present

## 2021-11-02 DIAGNOSIS — C61 Malignant neoplasm of prostate: Secondary | ICD-10-CM | POA: Diagnosis not present

## 2021-11-02 DIAGNOSIS — N312 Flaccid neuropathic bladder, not elsewhere classified: Secondary | ICD-10-CM | POA: Diagnosis not present

## 2021-11-07 DIAGNOSIS — Z23 Encounter for immunization: Secondary | ICD-10-CM | POA: Diagnosis not present

## 2021-11-14 DIAGNOSIS — L578 Other skin changes due to chronic exposure to nonionizing radiation: Secondary | ICD-10-CM | POA: Diagnosis not present

## 2021-11-14 DIAGNOSIS — L570 Actinic keratosis: Secondary | ICD-10-CM | POA: Diagnosis not present

## 2021-11-14 DIAGNOSIS — L820 Inflamed seborrheic keratosis: Secondary | ICD-10-CM | POA: Diagnosis not present

## 2021-11-14 DIAGNOSIS — C44612 Basal cell carcinoma of skin of right upper limb, including shoulder: Secondary | ICD-10-CM | POA: Diagnosis not present

## 2021-11-14 DIAGNOSIS — L821 Other seborrheic keratosis: Secondary | ICD-10-CM | POA: Diagnosis not present

## 2021-11-15 ENCOUNTER — Other Ambulatory Visit: Payer: Self-pay | Admitting: Cardiology

## 2021-11-16 NOTE — Telephone Encounter (Signed)
Rx refill sent to pharmacy. 

## 2021-11-17 DIAGNOSIS — C4361 Malignant melanoma of right upper limb, including shoulder: Secondary | ICD-10-CM | POA: Diagnosis not present

## 2021-11-28 DIAGNOSIS — C4361 Malignant melanoma of right upper limb, including shoulder: Secondary | ICD-10-CM | POA: Diagnosis not present

## 2021-11-28 DIAGNOSIS — L988 Other specified disorders of the skin and subcutaneous tissue: Secondary | ICD-10-CM | POA: Diagnosis not present

## 2021-12-01 DIAGNOSIS — Z961 Presence of intraocular lens: Secondary | ICD-10-CM | POA: Diagnosis not present

## 2021-12-20 ENCOUNTER — Other Ambulatory Visit: Payer: Self-pay | Admitting: Cardiology

## 2021-12-20 NOTE — Telephone Encounter (Signed)
Refill # 30 tablets only, patient needs appointment for future refills / 1st attempt

## 2021-12-27 DIAGNOSIS — R0602 Shortness of breath: Secondary | ICD-10-CM | POA: Diagnosis not present

## 2021-12-27 DIAGNOSIS — G4733 Obstructive sleep apnea (adult) (pediatric): Secondary | ICD-10-CM | POA: Diagnosis not present

## 2021-12-28 DIAGNOSIS — R0602 Shortness of breath: Secondary | ICD-10-CM | POA: Diagnosis not present

## 2021-12-28 DIAGNOSIS — G4733 Obstructive sleep apnea (adult) (pediatric): Secondary | ICD-10-CM | POA: Diagnosis not present

## 2022-01-27 ENCOUNTER — Other Ambulatory Visit: Payer: Self-pay | Admitting: Cardiology

## 2022-01-27 NOTE — Telephone Encounter (Signed)
Rx refill sent to pharmacy. 

## 2022-02-06 ENCOUNTER — Telehealth: Payer: Self-pay | Admitting: Cardiology

## 2022-02-06 NOTE — Telephone Encounter (Signed)
Pt c/o medication issue:  1. Name of Medication:  NEXLETOL 180 MG TABS  2. How are you currently taking this medication (dosage and times per day)?   3. Are you having a reaction (difficulty breathing--STAT)?   4. What is your medication issue?   Patient's S/O states that the price of this medication increased and she is requesting to apply for patient assistance.

## 2022-02-08 NOTE — Telephone Encounter (Signed)
Called patient and informed him that the company that makes Nexletol does not provide patient assistance and we do not currently have any samples of Nexletol for the patient. Will speak to Dr. Bettina Gavia regarding switching this patient to another medication that does the same thing.

## 2022-02-10 NOTE — Telephone Encounter (Signed)
Attempted to call the patient to inform them of Dr. Joya Gaskins recommendation. Patient did not answer the phone and voicemail has not been set-up so unable to leave a voicemail.

## 2022-02-13 NOTE — Progress Notes (Unsigned)
Cardiology Office Note:    Date:  02/14/2022   ID:  Chad Byrd, DOB 1928-06-26, MRN [id number]  PCP:  Chad Paddy, NP  Cardiologist:  Chad More, MD    Referring MD: Chad Paddy, NP    ASSESSMENT:    1. Coronary artery disease involving native coronary artery of native heart without angina pectoris   2. Aneurysm of ascending aorta without rupture ([place])   3. Mixed hyperlipidemia   4. Statin intolerance   5. Primary hypertension    PLAN:    In order of problems listed above:  He continues to do well with CAD having no angina we will continue to do treatment including his low-dose aspirin beta-blocker and bempedoic acid.  ( We decided because of age to stop doing screening CTs of the chest and do not think anybody would intervene electively at age 38 his family agrees Continue bempedoic acid will check labs CMP and lipids Well-controlled continue to take clonidine intermittently as needed   Next appointment: 1 year   Medication Adjustments/Labs and Tests Ordered: Current medicines are reviewed at length with the patient today.  Concerns regarding medicines are outlined above.  No orders of the defined types were placed in this encounter.  No orders of the defined types were placed in this encounter.   Chief Complaint  Patient presents with   Follow-up   Coronary Artery Disease    History of Present Illness:    Chad Byrd is a 89 y.o. male with a hx of coronary artery disease pulmonary embolism found incidentally on chest CT in October 2020 hyperlipidemia with statin intolerance aneurysmal dilation of the ascending aorta and stroke.  He had underwent coronary angiography December 2015 showing severe proximal left circumflex coronary stenosis felt to be high risk for elective intervention and was treated medically.  His chest CTA 05/18/2020 showed stable 46 mm ascending thoracic aortic aneurysm.  Last seen 11/16/2020.  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  Intermittently he will take a clonidine for hypertension not frequently perhaps once a month He has had no angina edema shortness of breath palpitation or syncope He tolerates his daily calcium without muscle pain or weakness his last lipid profile 08/25/2021 LDL 87 cholesterol 154 triglycerides 125 hemoglobin 15.4 creatinine 1.5 potassium 4.4 Past Medical History:  Diagnosis Date   Adverse reaction to drug that acts primarily on skin 10/09/2018   CAD (coronary artery disease)    Cataract    Coronary artery disease involving native coronary artery of native heart without angina pectoris 09/29/2015   HTN (hypertension) 01/10/2017   Hyperlipidemia    Hypertension    Statin intolerance 03/07/2016   Thoracic aortic aneurysm without rupture ([place]) 09/29/2015   TIA (transient ischemic attack) 01/10/2017   IMPRESSION:     Chad Berthold, DO  Neurology  Transient ischemic attack manifesting with left arm > face numbness on 04/08/2018.  He has not had these symptoms recur or new neurological symptoms.  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  With focal sensory symptoms, purely lacunar syndrome is also considered, however his blood p    Past Surgical History:  Procedure Laterality Date   HERNIA REPAIR  1982   RECTAL SURGERY  mid 1990s    Current Medications: Current Meds  Medication Sig   aspirin EC 81 MG tablet Take 1 tablet (81 mg total) by mouth daily.   cetirizine (ZYRTEC) 10 MG tablet Take 10 mg by mouth at bedtime as needed for allergies.   cloNIDine (CATAPRES) 0.1 MG tablet Take 1 tablet (0.1 mg total) by mouth daily as needed. (Patient taking differently: Take 0.1 mg by mouth daily as needed (Blood Pressure > 662 systolic).)   diphenhydrAMINE (BENADRYL ALLERGY) 25  MG tablet Take 25 mg by mouth as needed for itching.   finasteride (PROSCAR) 5 MG tablet Take 5 mg by mouth daily.    isosorbide mononitrate (IMDUR) 30 MG 24 hr tablet TAKE 1 TABLET BY MOUTH EVERY DAY   meclizine (ANTIVERT) 25 MG tablet Take 25 mg by mouth 3 (three) times daily as needed for dizziness.   metoprolol succinate (TOPROL-XL) 25 MG 24 hr tablet Take 0.5 tablets (12.5 mg total) by mouth daily. Patient needs an appointment for further refills. 2 nd attempt   Multiple Vitamin (MULTI-VITAMIN) tablet Take by mouth.   NEXLETOL 180 MG TABS TAKE 1 TABLET BY MOUTH DAILY   nitroGLYCERIN (NITROSTAT) 0.4 MG SL tablet Place 0.4 mg under the tongue every 5 (five) minutes as needed for chest pain.     Allergies:   Atorvastatin, Pravastatin, Simvastatin, Statins, and Eliquis [apixaban]   Social History   Socioeconomic History   Marital status: Divorced    Spouse name: Chad Byrd = sign. 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 He lives with his girlfriend of 48 years, Chad Byrd.    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   Social Determinants of Health   Financial Resource Strain: Not on file  Food Insecurity: Not on file  Transportation Needs: Not on file  Physical Activity: Not on file  Stress: Not on file  Social Connections: Not on file     Family History: The patient's family history includes Alzheimer's disease in his brother; Lung cancer in his sister. ROS:   Please see the history of present  illness.    All other systems reviewed and are negative.  EKGs/Labs/Other Studies Reviewed:    The following studies were AAS or AI reviewed today:  EKG:  EKG ordered today and personally reviewed.  The ekg ordered today demonstrates sinus rhythm left anterior hemiblock nonspecific T waves  Recent Labs: No results found for requested labs within last 365 days.  Recent Lipid Panel    Component Value Date/Time   CHOL 153 02/05/2019 1511   CHOL 203 (H) 09/29/2015 1039   TRIG 180 (H) 02/05/2019 1511   TRIG 127 09/29/2015 1039   HDL 35 (L) 02/05/2019 1511   HDL 36 (L) 09/29/2015 1039   CHOLHDL 4.4 02/20/2018 0859   CHOLHDL 4.0 01/11/2017 0444   VLDL 30 01/11/2017 0444   LDLCALC 87 02/05/2019 1511   LDLCALC 142 (H) 09/29/2015 1039    Physical  Exam:    VS:  BP 134/60 (BP Location: Right Arm, Patient Position: Sitting)   Pulse 73   Ht '5\' 7"'$  (1.702 m)   Wt 192 lb (87.1 kg)   SpO2 97%   BMI 30.07 kg/m     Wt Readings from Last 3 Encounters:  02/14/22 192 lb (87.1 kg)  03/17/21 198 lb 3.2 oz (89.9 kg)  11/16/20 194 lb 6.4 oz (88.2 kg)     GEN: Looks younger than his age well nourished, well developed in no acute distress HEENT: Normal NECK: No JVD; No carotid bruits LYMPHATICS: No lymphadenopathy CARDIAC: 1 out of 6 aortic flow murmur no findings to suggest significant RRR, no murmurs, rubs, gallops RESPIRATORY:  Clear to auscultation without rales, wheezing or rhonchi  ABDOMEN: Soft, non-tender, non-distended MUSCULOSKELETAL:  No edema; No deformity  SKIN: Warm and dry NEUROLOGIC:  Alert and oriented x 3 PSYCHIATRIC:  Normal affect    Signed, Chad More, MD  02/14/2022 2:22 PM    [facility]

## 2022-02-14 ENCOUNTER — Encounter: Payer: Self-pay | Admitting: Cardiology

## 2022-02-14 ENCOUNTER — Other Ambulatory Visit: Payer: Self-pay

## 2022-02-14 ENCOUNTER — Ambulatory Visit: Payer: Medicare Other | Attending: Cardiology | Admitting: Cardiology

## 2022-02-14 VITALS — BP 134/60 | HR 73 | Ht 67.0 in | Wt 192.0 lb

## 2022-02-14 DIAGNOSIS — I7121 Aneurysm of the ascending aorta, without rupture: Secondary | ICD-10-CM | POA: Diagnosis not present

## 2022-02-14 DIAGNOSIS — I2510 Atherosclerotic heart disease of native coronary artery without angina pectoris: Secondary | ICD-10-CM | POA: Diagnosis not present

## 2022-02-14 DIAGNOSIS — E782 Mixed hyperlipidemia: Secondary | ICD-10-CM

## 2022-02-14 DIAGNOSIS — Z789 Other specified health status: Secondary | ICD-10-CM

## 2022-02-14 DIAGNOSIS — I10 Essential (primary) hypertension: Secondary | ICD-10-CM | POA: Diagnosis not present

## 2022-02-14 MED ORDER — METOPROLOL SUCCINATE ER 25 MG PO TB24: 12.5000 mg | ORAL_TABLET | Freq: Every day | ORAL | 3 refills | Status: DC

## 2022-02-14 MED ORDER — CLONIDINE HCL 0.1 MG PO TABS: 0.1000 mg | ORAL_TABLET | Freq: Every day | ORAL | 2 refills | Status: DC | PRN

## 2022-02-14 NOTE — Patient Instructions (Signed)
Medication Instructions:  Your physician recommends that you continue on your current medications as directed. Please refer to the Current Medication list given to you today.  *If you need a refill on your cardiac medications before your next appointment, please call your pharmacy*   Lab Work: Your physician recommends that you return for lab work in:   Labs today: CMP, Lipids  If you have labs (blood work) drawn today and your tests are completely normal, you will receive your results only by: [place] (if you have Mariano Colon) OR A paper copy in the mail If you have any lab test that is abnormal or we need to change your treatment, we will call you to review the results.   Testing/Procedures: None   Follow-Up: At [facility], you and your health needs are our priority.  As part of our continuing mission to provide you with exceptional heart care, we have created designated Provider Care Teams.  These Care Teams include your primary Cardiologist (physician) and Advanced Practice Providers (APPs -  Physician Assistants and Nurse Practitioners) who all work together to provide you with the care you need, when you need it.  We recommend signing up for the patient portal called "MyChart".  Sign up information is provided on this After Visit Summary.  MyChart is used to connect with patients for Virtual Visits (Telemedicine).  Patients are able to view lab/test results, encounter notes, upcoming appointments, etc.  Non-urgent messages can be sent to your provider as well.   To learn more about what you can do with MyChart, go to [email or web address].    Your next appointment:   1 year(s)  Provider:   Shirlee More, MD    Other Instructions None

## 2022-02-14 NOTE — Telephone Encounter (Signed)
Patient had an appointment with Dr. Bettina Gavia today and he was able to get the [facility] and we do not need to start Zetia. Patient had no further questions at this time.

## 2022-02-15 LAB — COMPREHENSIVE METABOLIC PANEL
ALT: 14 IU/L (ref 0–44)
AST: 21 IU/L (ref 0–40)
Albumin/Globulin Ratio: 1.8 (ref 1.2–2.2)
Albumin: 4.2 g/dL (ref 3.6–4.6)
Alkaline Phosphatase: 56 IU/L (ref 44–121)
BUN/Creatinine Ratio: 15 (ref 10–24)
BUN: 20 mg/dL (ref 10–36)
Bilirubin Total: 0.5 mg/dL (ref 0.0–1.2)
CO2: 26 mmol/L (ref 20–29)
Calcium: 9.5 mg/dL (ref 8.6–10.2)
Chloride: 106 mmol/L (ref 96–106)
Creatinine, Ser: 1.31 mg/dL — ABNORMAL HIGH (ref 0.76–1.27)
Globulin, Total: 2.4 g/dL (ref 1.5–4.5)
Glucose: 91 mg/dL (ref 70–99)
Potassium: 4.4 mmol/L (ref 3.5–5.2)
Sodium: 145 mmol/L — ABNORMAL HIGH (ref 134–144)
Total Protein: 6.6 g/dL (ref 6.0–8.5)
eGFR: 51 mL/min/{1.73_m2} — ABNORMAL LOW (ref >59)

## 2022-02-15 LAB — LIPID PANEL
Chol/HDL Ratio: 4 ratio (ref 0.0–5.0)
Cholesterol, Total: 172 mg/dL (ref 100–199)
HDL: 43 mg/dL (ref >39)
LDL Chol Calc (NIH): 101 mg/dL — ABNORMAL HIGH (ref 0–99)
Triglycerides: 160 mg/dL — ABNORMAL HIGH (ref 0–149)
VLDL Cholesterol Cal: 28 mg/dL (ref 5–40)

## 2022-02-21 DIAGNOSIS — C4361 Malignant melanoma of right upper limb, including shoulder: Secondary | ICD-10-CM | POA: Diagnosis not present

## 2022-02-21 DIAGNOSIS — Z85820 Personal history of malignant melanoma of skin: Secondary | ICD-10-CM | POA: Diagnosis not present

## 2022-02-28 ENCOUNTER — Other Ambulatory Visit: Payer: Self-pay | Admitting: Cardiology

## 2022-02-28 MED ORDER — NITROGLYCERIN 0.4 MG SL SUBL: 0.4000 mg | SUBLINGUAL_TABLET | SUBLINGUAL | 0 refills | Status: AC | PRN

## 2022-02-28 NOTE — Telephone Encounter (Signed)
*  STAT* If patient is at the pharmacy, call can be transferred to refill team.   1. Which medications need to be refilled? (please list name of each medication and dose if known)   nitroGLYCERIN (NITROSTAT) 0.4 MG SL tablet   2. Which pharmacy/location (including street and city if local pharmacy) is medication to be sent to?  [id number] - [place], [place] - [address] AT [address]   3. Do they need a 30 day or 90 day supply?     Caller stated patient's medication has expired and he needs a few current tablets.

## 2022-03-10 ENCOUNTER — Other Ambulatory Visit: Payer: Self-pay | Admitting: Cardiology

## 2022-03-17 ENCOUNTER — Telehealth: Payer: Self-pay | Admitting: Cardiology

## 2022-03-17 NOTE — Telephone Encounter (Signed)
Pt c/o medication issue:  1. Name of Medication: Nexletol  2. How are you currently taking this medication (dosage and times per day)? 1 tablet daily  3. Are you having a reaction (difficulty breathing--STAT)?  4. What is your medication issue? Patient wife wants to know if there is a patient assistance program, or coupon, or discount card for this medicine? She said medicine is too expensive

## 2022-03-20 NOTE — Telephone Encounter (Signed)
Attempted to call Chad Byrd but she did not answer the phone and a voice mailbox was not set-up for her so unable to leave a message.

## 2022-03-22 NOTE — Telephone Encounter (Signed)
[place] and she stated that she was unable to afford the Nexletol medication and she was asking about patient assistance programs for this medication. I informed her that Nexletol doesn't offer patient assistance programs, but we did have a co-pay card that she could pick - up and use to help pay for the medication. She was appreciative for the co-pay card.   She also wanted to check with Dr. Bettina Gavia when he returns on Monday to see if the patient could be switched to another medication that was cheaper. I informed her that we would follow up with Dr. Bettina Gavia when he returns. [name] had no further questions at this time.

## 2022-06-28 DIAGNOSIS — C61 Malignant neoplasm of prostate: Secondary | ICD-10-CM | POA: Diagnosis not present

## 2022-07-05 DIAGNOSIS — C61 Malignant neoplasm of prostate: Secondary | ICD-10-CM | POA: Diagnosis not present

## 2022-07-05 DIAGNOSIS — N401 Enlarged prostate with lower urinary tract symptoms: Secondary | ICD-10-CM | POA: Diagnosis not present

## 2022-07-05 DIAGNOSIS — N312 Flaccid neuropathic bladder, not elsewhere classified: Secondary | ICD-10-CM | POA: Diagnosis not present

## 2022-09-28 ENCOUNTER — Other Ambulatory Visit: Payer: Self-pay | Admitting: Cardiology

## 2022-10-12 DIAGNOSIS — R7303 Prediabetes: Secondary | ICD-10-CM | POA: Diagnosis not present

## 2022-10-12 DIAGNOSIS — I10 Essential (primary) hypertension: Secondary | ICD-10-CM | POA: Diagnosis not present

## 2022-10-12 DIAGNOSIS — Z0000 Encounter for general adult medical examination without abnormal findings: Secondary | ICD-10-CM | POA: Diagnosis not present

## 2022-10-12 DIAGNOSIS — I25118 Atherosclerotic heart disease of native coronary artery with other forms of angina pectoris: Secondary | ICD-10-CM | POA: Diagnosis not present

## 2022-10-12 DIAGNOSIS — N183 Chronic kidney disease, stage 3 unspecified: Secondary | ICD-10-CM | POA: Diagnosis not present

## 2022-10-12 DIAGNOSIS — Z23 Encounter for immunization: Secondary | ICD-10-CM | POA: Diagnosis not present

## 2022-11-07 DIAGNOSIS — L821 Other seborrheic keratosis: Secondary | ICD-10-CM | POA: Diagnosis not present

## 2022-11-07 DIAGNOSIS — L570 Actinic keratosis: Secondary | ICD-10-CM | POA: Diagnosis not present

## 2022-11-07 DIAGNOSIS — Z85820 Personal history of malignant melanoma of skin: Secondary | ICD-10-CM | POA: Diagnosis not present

## 2022-11-07 DIAGNOSIS — L820 Inflamed seborrheic keratosis: Secondary | ICD-10-CM | POA: Diagnosis not present

## 2022-11-07 DIAGNOSIS — L578 Other skin changes due to chronic exposure to nonionizing radiation: Secondary | ICD-10-CM | POA: Diagnosis not present

## 2022-12-06 DIAGNOSIS — Z961 Presence of intraocular lens: Secondary | ICD-10-CM | POA: Diagnosis not present

## 2022-12-29 ENCOUNTER — Other Ambulatory Visit: Payer: Self-pay | Admitting: Cardiology

## 2023-01-01 NOTE — Telephone Encounter (Signed)
Rx refill sent to pharmacy. 

## 2023-02-01 DIAGNOSIS — C61 Malignant neoplasm of prostate: Secondary | ICD-10-CM | POA: Diagnosis not present

## 2023-02-02 ENCOUNTER — Other Ambulatory Visit: Payer: Self-pay | Admitting: Cardiology

## 2023-02-07 DIAGNOSIS — C61 Malignant neoplasm of prostate: Secondary | ICD-10-CM | POA: Diagnosis not present

## 2023-02-07 DIAGNOSIS — N312 Flaccid neuropathic bladder, not elsewhere classified: Secondary | ICD-10-CM | POA: Diagnosis not present

## 2023-02-21 ENCOUNTER — Other Ambulatory Visit: Payer: Self-pay | Admitting: Cardiology

## 2023-02-21 NOTE — Telephone Encounter (Signed)
Rx refill sent to pharmacy. 

## 2023-02-22 ENCOUNTER — Other Ambulatory Visit: Payer: Self-pay | Admitting: Cardiology

## 2023-02-22 ENCOUNTER — Encounter: Payer: Self-pay | Admitting: Cardiology

## 2023-02-22 NOTE — Telephone Encounter (Signed)
Prescription sent to pharmacy.

## 2023-02-25 NOTE — Progress Notes (Unsigned)
Cardiology Office Note:    Date:  02/26/2023   ID:  Chad Byrd, DOB 1928-08-03, MRN [id number]  PCP:  Julianne Handler, NP  Cardiologist:  Norman Herrlich, MD    Referring MD: Julianne Handler, NP    ASSESSMENT:    1. Coronary artery disease involving native coronary artery of native heart without angina pectoris   2. Mixed hyperlipidemia   3. Statin intolerance   4. Primary hypertension   5. Aneurysm of ascending aorta without rupture (HCC)   6. Hypertension, unspecified type    PLAN:    In order of problems listed above:  Overall Chad Byrd continues to do well Stable CAD having no angina continue his medical therapy including aspirin age greater than 90 not at Blood pressure is well-controlled for his age group we will assume a strategy that his blood pressure is high especially in the morning to rest weight recheck and if is persistently greater than 175 he can take need understanding and needs bed rest for 30 minutes afterwards Made decision not to reimage for aneurysm of the ascending aorta asymptomatic in his age group   Next appointment: 1 year follow-up   Medication Adjustments/Labs and Tests Ordered: Current medicines are reviewed at length with the patient today.  Concerns regarding medicines are outlined above.  Orders Placed This Encounter  Procedures   EKG 12-Lead   No orders of the defined types were placed in this encounter.    History of Present Illness:    Read Chad Byrd is a 89 y.o. male with a hx of coronary artery disease pulmonary embolism found incidentally on chest CT in October 2020 hyperlipidemia statin intolerance aneurysmal dilation of the ascending aorta and stroke.He had underwent coronary angiography December 2015 showing severe proximal left circumflex coronary stenosis felt to be high risk for elective intervention and was treated medically. His chest CTA 05/18/2020 showed stable 46 mm ascending thoracic aortic aneurysm He was last seen  02/14/2022.  Compliance with diet, lifestyle and medications: Today's visit is prompted by an episode of elevated blood pressure at home his wife is present supervises medications compliant in general he has done well Blood pressure 1 day was greater than 170 but he came down with rest Typically runs in the range of 1 30-1 50 systolic He is not needed to take clonidine He has not had angina edema shortness of breath or syncope His lipid profile 02/14/2022 showed a cholesterol 172 LDL 101 he currently is not on lipid-lowering therapy  Past Medical History:  Diagnosis Date   Adverse reaction to drug that acts primarily on skin 10/09/2018   CAD (coronary artery disease)    Cataract    Coronary artery disease involving native coronary artery of native heart without angina pectoris 09/29/2015   HTN (hypertension) 01/10/2017   Hyperlipidemia    Hypertension    Left sided numbness 01/10/2017   Primary osteoarthritis of both feet 05/28/2019   Primary osteoarthritis of both hands 05/28/2019   Pruritus 02/05/2019   Statin intolerance 03/07/2016   Thoracic aortic aneurysm without rupture (HCC) 09/29/2015   TIA (transient ischemic attack) 01/10/2017   IMPRESSION:     [name], DO  Neurology  Transient ischemic attack manifesting with left arm > face numbness on 04/08/2018.  He has not had these symptoms recur or new neurological symptoms.  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  With focal sensory symptoms, purely lacunar syndrome is  also considered, however his blood p    Current Medications: Current Meds  Medication Sig   aspirin EC 81 MG tablet Take 1 tablet (81 mg total) by mouth daily.   Calcium Carbonate Antacid (CALCIUM CARBONATE PO) Take by mouth.   diphenhydrAMINE (BENADRYL ALLERGY) 25 MG tablet Take 25 mg by mouth as needed for itching.   finasteride (PROSCAR) 5 MG tablet Take 5 mg by mouth daily.     isosorbide mononitrate (IMDUR) 30 MG 24 hr tablet Take 1 tablet (30 mg total) by mouth daily.   meclizine (ANTIVERT) 25 MG tablet Take 25 mg by mouth 3 (three) times daily as needed for dizziness.   metoprolol succinate (TOPROL-XL) 25 MG 24 hr tablet TAKE 1/2 TABLET(12.5 MG) BY MOUTH DAILY   Multiple Vitamin (MULTI-VITAMIN) tablet Take by mouth.   nitroGLYCERIN (NITROSTAT) 0.4 MG SL tablet Place 1 tablet (0.4 mg total) under the tongue every 5 (five) minutes as needed for up to 25 doses for chest pain.      EKGs/Labs/Other Studies Reviewed:    The following studies were reviewed today:  Cardiac Studies & Procedures   CARDIAC CATHETERIZATION  CARDIAC CATHETERIZATION 01/14/2014   STRESS TESTS  MYOCARDIAL PERFUSION IMAGING 12/03/2017  ECHOCARDIOGRAM  ECHOCARDIOGRAM COMPLETE 10/15/2017  Narrative *[facility]* [address] [place], [place] [address] [phone]  ------------------------------------------------------------------- Transthoracic Echocardiography  Patient:    Chad, Byrd MR #:       [id number] Study Date: 10/15/2017 Gender:     M Age:        89 Height:     176.5 cm Weight:     85 kg BSA:        2.06 m^2 Pt. Status: Room:  SONOGRAPHER  Cathie Beams ATTENDING    Joni Reining M ORDERING     Joni Reining M REFERRING    Joni Reining M PERFORMING   [facility] Outpatient  cc:  ------------------------------------------------------------------- LV EF: 50% -   55%  ------------------------------------------------------------------- Indications:      I10 Essential hypertension. W57.XXXD Tick bite.  ------------------------------------------------------------------- History:   PMH:   Coronary artery disease.  Transient ischemic attack.  Risk factors:  Dyslipidemia.  ------------------------------------------------------------------- Study Conclusions  - Left ventricle: The cavity size was normal. There was moderate concentric  hypertrophy. Systolic function was normal. The estimated ejection fraction was in the range of 50% to 55%. Wall motion was normal; there were no regional wall motion abnormalities. - Aortic valve: Sclerosis without stenosis. There was mild to moderate regurgitation. Regurgitation pressure half-time: 459 ms. - Mitral valve: There was no significant regurgitation. - Right ventricle: Systolic function was normal. - Atrial septum: No defect or patent foramen ovale was identified. - Tricuspid valve: There was trivial regurgitation. - Pulmonic valve: There was no significant regurgitation.  Impressions:  - Normal LV EF. Aortic valve calcified with mild-moderate aortic regurgitation.  ------------------------------------------------------------------- Study data:  Comparison was made to the study of 01/11/2017.  Study status:  Routine.  Procedure:  The patient reported no pain pre or post test. Transthoracic echocardiography. Image quality was adequate.  Study completion:  There were no complications. Transthoracic echocardiography.  M-mode, complete 2D, spectral Doppler, and color Doppler.  Birthdate:  Patient birthdate: April 26, 1928.  Age:  Patient is 89 yr old.  Sex:  Gender: male. BMI: 27.3 kg/m^2.  Blood pressure:     111/67  Patient status: Outpatient.  Study date:  Study date: 10/15/2017. Study time: 01:00 PM.  Location:  [facility]  -------------------------------------------------------------------  -------------------------------------------------------------------  Left ventricle:  The cavity size was normal. There was moderate concentric hypertrophy. Systolic function was normal. The estimated ejection fraction was in the range of 50% to 55%. Wall motion was normal; there were no regional wall motion abnormalities. The tissue Doppler parameters were abnormal. Findings consistent with left ventricular diastolic  dysfunction.  ------------------------------------------------------------------- Aortic valve:   Trileaflet; mildly thickened, mildly calcified leaflets. Mobility was not restricted. Sclerosis without stenosis. Doppler:  Transvalvular velocity was within the normal range. There was mild to moderate regurgitation.  ------------------------------------------------------------------- Aorta:  Aortic root: The aortic root was trivially dilated.  ------------------------------------------------------------------- Mitral valve:   Structurally normal valve.   Mobility was not restricted.  Doppler:  Transvalvular velocity was within the normal range. There was no evidence for stenosis. There was no significant regurgitation.    Valve area by pressure half-time: 2.56 cm^2. Indexed valve area by pressure half-time: 1.24 cm^2/m^2.  ------------------------------------------------------------------- Left atrium:  The atrium was normal in size.  ------------------------------------------------------------------- Atrial septum:  No defect or patent foramen ovale was identified.  ------------------------------------------------------------------- Right ventricle:  The cavity size was normal. Wall thickness was normal. Systolic function was normal.  ------------------------------------------------------------------- Pulmonic valve:   Poorly visualized.  The valve appears to be grossly normal.    Doppler:  Transvalvular velocity was within the normal range. There was no evidence for stenosis. There was no significant regurgitation.  ------------------------------------------------------------------- Tricuspid valve:   Structurally normal valve.    Doppler: Transvalvular velocity was within the normal range. There was trivial regurgitation.  ------------------------------------------------------------------- Pulmonary artery:   The main pulmonary artery was normal-sized. Systolic pressure  could not be accurately estimated.  ------------------------------------------------------------------- Right atrium:  The atrium was normal in size.  ------------------------------------------------------------------- Pericardium:  There was no pericardial effusion.  ------------------------------------------------------------------- Systemic veins: Inferior vena cava: The vessel was normal in size.  ------------------------------------------------------------------- Measurements  Left ventricle                           Value          Reference LV ID, ED, PLAX chordal        (L)       30    mm       43 - 52 LV ID, ES, PLAX chordal                  24    mm       23 - 38 LV fx shortening, PLAX chordal (L)       20    %        >=29 LV PW thickness, ED                      13    mm       ---------- IVS/LV PW ratio, ED                      1.15           <=1.3 LV e&', lateral                           7.83  cm/s     ---------- LV E/e&', lateral                         6.93           ---------- LV  e&', medial                            6.2   cm/s     ---------- LV E/e&', medial                          8.76           ---------- LV e&', average                           7.02  cm/s     ---------- LV E/e&', average                         7.74           ----------  Ventricular septum                       Value          Reference IVS thickness, ED                        15    mm       ----------  LVOT                                     Value          Reference LVOT ID, S                               20    mm       ---------- LVOT area                                3.14  cm^2     ----------  Aortic valve                             Value          Reference Aortic regurg peak velocity              205   cm/s     ---------- Aortic regurg pressure                   459   ms       ---------- half-time Aortic regurg peak gradient              17    mm Hg    ----------  Aorta                                     Value          Reference Aortic root ID, ED                       41    mm       ----------  Left atrium  Value          Reference LA ID, A-P, ES                           29    mm       ---------- LA ID/bsa, A-P                           1.41  cm/m^2   <=2.2 LA volume, S                             37.5  ml       ---------- LA volume/bsa, S                         18.2  ml/m^2   ---------- LA volume, ES, 1-p A4C                   29.8  ml       ---------- LA volume/bsa, ES, 1-p A4C               14.5  ml/m^2   ---------- LA volume, ES, 1-p A2C                   43.7  ml       ---------- LA volume/bsa, ES, 1-p A2C               21.2  ml/m^2   ----------  Mitral valve                             Value          Reference Mitral E-wave peak velocity              54.3  cm/s     ---------- Mitral A-wave peak velocity              100   cm/s     ---------- Mitral deceleration time       (H)       292   ms       150 - 230 Mitral E/A ratio, peak                   0.5            ---------- Mitral valve area, PHT, DP               2.56  cm^2     ---------- Mitral valve area/bsa, PHT, DP           1.24  cm^2/m^2 ----------  Right atrium                             Value          Reference RA ID, S-I, ES, A4C            (H)       50.5  mm       34 - 49 RA area, ES, A4C                         14.9  cm^2     8.3 - 19.5 RA volume, ES, A/L  35.1  ml       ---------- RA volume/bsa, ES, A/L                   17.1  ml/m^2   ----------  Right ventricle                          Value          Reference RV s&', lateral, S                        11.3  cm/s     ----------  Legend: (L)  and  (H)  mark values outside specified reference range.  ------------------------------------------------------------------- Prepared and Electronically Authenticated by  Jodelle Red 2019-09-16T16:26:29                 Recent  Labs: No results found for requested labs within last 365 days.  Recent Lipid Panel    Component Value Date/Time   CHOL 172 02/14/2022 1435   CHOL 203 (H) 09/29/2015 1039   TRIG 160 (H) 02/14/2022 1435   TRIG 127 09/29/2015 1039   HDL 43 02/14/2022 1435   HDL 36 (L) 09/29/2015 1039   CHOLHDL 4.0 02/14/2022 1435   CHOLHDL 4.0 01/11/2017 0444   VLDL 30 01/11/2017 0444   LDLCALC 101 (H) 02/14/2022 1435   LDLCALC 142 (H) 09/29/2015 1039   His EKG would not come across into epic.  I have independently reviewed and it shows sinus rhythm nonspecific T waves late transition in the precordial leads I will sign it be placed medical record Physical Exam:    VS:  BP (!) 144/72   Pulse 76   Ht 5\' 9"  (1.753 m)   Wt 185 lb (83.9 kg)   SpO2 96%   BMI 27.32 kg/m     Wt Readings from Last 3 Encounters:  02/26/23 185 lb (83.9 kg)  02/14/22 192 lb (87.1 kg)  03/17/21 198 lb 3.2 oz (89.9 kg)     GEN:  Well nourished, well developed in no acute distress HEENT: Normal NECK: No JVD; No carotid bruits LYMPHATICS: No lymphadenopathy CARDIAC: RRR, no murmurs, rubs, gallops RESPIRATORY:  Clear to auscultation without rales, wheezing or rhonchi  ABDOMEN: Soft, non-tender, non-distended MUSCULOSKELETAL:  No edema; No deformity  SKIN: Warm and dry NEUROLOGIC:  Alert and oriented x 3 PSYCHIATRIC:  Normal affect    Signed, Norman Herrlich, MD  02/26/2023 2:56 PM    [facility]

## 2023-02-26 ENCOUNTER — Encounter: Payer: Self-pay | Admitting: Cardiology

## 2023-02-26 ENCOUNTER — Ambulatory Visit: Payer: Medicare Other | Attending: Cardiology | Admitting: Cardiology

## 2023-02-26 VITALS — BP 144/72 | HR 76 | Ht 69.0 in | Wt 185.0 lb

## 2023-02-26 DIAGNOSIS — E782 Mixed hyperlipidemia: Secondary | ICD-10-CM | POA: Insufficient documentation

## 2023-02-26 DIAGNOSIS — I10 Essential (primary) hypertension: Secondary | ICD-10-CM | POA: Insufficient documentation

## 2023-02-26 DIAGNOSIS — I2510 Atherosclerotic heart disease of native coronary artery without angina pectoris: Secondary | ICD-10-CM | POA: Diagnosis not present

## 2023-02-26 DIAGNOSIS — Z789 Other specified health status: Secondary | ICD-10-CM | POA: Insufficient documentation

## 2023-02-26 DIAGNOSIS — I7121 Aneurysm of the ascending aorta, without rupture: Secondary | ICD-10-CM | POA: Diagnosis not present

## 2023-02-26 MED ORDER — CLONIDINE HCL 0.1 MG PO TABS: 0.1000 mg | ORAL_TABLET | Freq: Every day | ORAL | 3 refills | Status: AC

## 2023-02-26 NOTE — Patient Instructions (Signed)
Medication Instructions:  Your physician has recommended you make the following change in your medication:   START: Clonidine 0.1 mg daily for systolic blood pressure greater than 175 mm hg  *If you need a refill on your cardiac medications before your next appointment, please call your pharmacy*   Lab Work: None If you have labs (blood work) drawn today and your tests are completely normal, you will receive your results only by: MyChart Message (if you have MyChart) OR A paper copy in the mail If you have any lab test that is abnormal or we need to change your treatment, we will call you to review the results.   Testing/Procedures: None   Follow-Up: At [facility], you and your health needs are our priority.  As part of our continuing mission to provide you with exceptional heart care, we have created designated Provider Care Teams.  These Care Teams include your primary Cardiologist (physician) and Advanced Practice Providers (APPs -  Physician Assistants and Nurse Practitioners) who all work together to provide you with the care you need, when you need it.  We recommend signing up for the patient portal called "MyChart".  Sign up information is provided on this After Visit Summary.  MyChart is used to connect with patients for Virtual Visits (Telemedicine).  Patients are able to view lab/test results, encounter notes, upcoming appointments, etc.  Non-urgent messages can be sent to your provider as well.   To learn more about what you can do with MyChart, go to [email or web address].    Your next appointment:   6 month(s)  Provider:   Norman Herrlich, MD    Other Instructions None

## 2023-02-28 DIAGNOSIS — R634 Abnormal weight loss: Secondary | ICD-10-CM | POA: Diagnosis not present

## 2023-02-28 DIAGNOSIS — M1611 Unilateral primary osteoarthritis, right hip: Secondary | ICD-10-CM | POA: Diagnosis not present

## 2023-02-28 DIAGNOSIS — Z79899 Other long term (current) drug therapy: Secondary | ICD-10-CM | POA: Diagnosis not present

## 2023-02-28 DIAGNOSIS — M25551 Pain in right hip: Secondary | ICD-10-CM | POA: Diagnosis not present

## 2023-02-28 DIAGNOSIS — N183 Chronic kidney disease, stage 3 unspecified: Secondary | ICD-10-CM | POA: Diagnosis not present

## 2023-03-06 ENCOUNTER — Other Ambulatory Visit: Payer: Self-pay | Admitting: Cardiology

## 2023-03-07 ENCOUNTER — Other Ambulatory Visit: Payer: Self-pay

## 2023-03-07 ENCOUNTER — Telehealth: Payer: Self-pay | Admitting: Cardiology

## 2023-03-07 NOTE — Telephone Encounter (Signed)
 Called patient and he stated that he was still taking Nexletol . Is it ok if I send a prescription in so he can continue taking this medication?

## 2023-03-07 NOTE — Telephone Encounter (Signed)
 Pt c/o medication issue:  1. Name of Medication: Nexletol  180 mg tablet   2. How are you currently taking this medication (dosage and times per day)? 180 mg tablet 1 daily  3. Are you having a reaction (difficulty breathing--STAT)? No   4. What is your medication issue? Wife is calling to get a refill. I told her on 02/26/23 pt reported he was not taking it. Wife said that is incorrect.

## 2023-03-08 ENCOUNTER — Other Ambulatory Visit: Payer: Self-pay

## 2023-03-08 MED ORDER — NEXLETOL 180 MG PO TABS: 1.0000 | ORAL_TABLET | Freq: Every day | ORAL | 3 refills | Status: AC

## 2023-03-08 NOTE — Telephone Encounter (Signed)
 Called patient and informed him that Dr. Sandee Crook would like him to continue taking the Nexletol  medication. Patient verbalized understanding and had no further questions at this time. A prescription for Nexletol  was sent to the patient's pharmacy.

## 2023-04-22 ENCOUNTER — Other Ambulatory Visit: Payer: Self-pay | Admitting: Cardiology

## 2023-05-08 DIAGNOSIS — L700 Acne vulgaris: Secondary | ICD-10-CM | POA: Diagnosis not present

## 2023-05-08 DIAGNOSIS — L728 Other follicular cysts of the skin and subcutaneous tissue: Secondary | ICD-10-CM | POA: Diagnosis not present

## 2023-05-08 DIAGNOSIS — D0461 Carcinoma in situ of skin of right upper limb, including shoulder: Secondary | ICD-10-CM | POA: Diagnosis not present

## 2023-05-08 DIAGNOSIS — D2261 Melanocytic nevi of right upper limb, including shoulder: Secondary | ICD-10-CM | POA: Diagnosis not present

## 2023-05-08 DIAGNOSIS — L570 Actinic keratosis: Secondary | ICD-10-CM | POA: Diagnosis not present

## 2023-05-08 DIAGNOSIS — C44622 Squamous cell carcinoma of skin of right upper limb, including shoulder: Secondary | ICD-10-CM | POA: Diagnosis not present

## 2023-05-11 ENCOUNTER — Telehealth: Payer: Self-pay | Admitting: Cardiology

## 2023-05-11 ENCOUNTER — Telehealth: Payer: Self-pay | Admitting: Emergency Medicine

## 2023-05-11 DIAGNOSIS — R0683 Snoring: Secondary | ICD-10-CM

## 2023-05-11 NOTE — Telephone Encounter (Signed)
 Patient needs a itamar sleep study. Will come to [place] office to pick it up once it is approved.

## 2023-05-11 NOTE — Telephone Encounter (Signed)
 Called and spoke to [place] per DPR.    [name] reports that patient wakes up out of his sleep felling dizzy and having a headache. She reports the last 4 mornings he has woken up around 4 am with blood pressures of 163/67,164/66,167/69. She reports that after he sits in the recliner for about 15 minutes he feels better and his blood pressure is in the 130's systolic. Informed [name] I would speak to Dr. Arlester Marker (DOD) regarding this. She verbalized understanding and had no further questions.

## 2023-05-11 NOTE — Telephone Encounter (Signed)
 Pt c/o BP issue: STAT if pt c/o blurred vision, one-sided weakness or slurred speech.  1. What is your BP concern?  4/7 nights patient has been waking up out of his sleep with elevated BP + becoming more frequent.  2. Have you taken any BP medication today? No, patient usually takes Metoprolol and Isosorbide in the evening before bed   3. What are your last 5 BP readings? 167/69 164/66 163/67  4. Are you having any other symptoms (ex. Dizziness, headache, blurred vision, passed out)?  Dizziness + headache

## 2023-05-11 NOTE — Telephone Encounter (Signed)
 Spoke to Dr. Bing Matter who recommends patient not check his blood pressure upon waking up. He also recommends patient complete an Itamar sleep study. Called [name] and informed her of this. She was agreeable with this, and had no further questions.

## 2023-05-14 NOTE — Telephone Encounter (Signed)
**Note De-Identified Chad Byrd Obfuscation** Per the Medicare part A and B website: Medicare Part A and B do not require prior authorization for CPT Code: [address]  Forwarding note back to Dr Perri Brake nurse so she can provide the [facility] One-HST Pin # of "[phone]" when she gives the pt a device.

## 2023-05-16 NOTE — Telephone Encounter (Signed)
 Patient agreement reviewed and signed on 05/16/2023.  WatchPAT issued to patient on 05/16/2023 by Emery Hans, RN. Patient aware to not open the WatchPAT box until contacted with the activation PIN. Patient profile initialized in CloudPAT on 05/16/2023 by Cammy, RN  Device serial number:  [id number]  Please list Reason for Call as Advice Only and type "WatchPAT issued to patient" in the comment box.

## 2023-05-16 NOTE — Telephone Encounter (Signed)
 Called and spoke to [place] per DPR. Informed her that itamar is ready to be picked up. She thanked me for calling

## 2023-05-24 ENCOUNTER — Encounter (INDEPENDENT_AMBULATORY_CARE_PROVIDER_SITE_OTHER): Payer: Self-pay | Admitting: Cardiology

## 2023-05-24 DIAGNOSIS — G4733 Obstructive sleep apnea (adult) (pediatric): Secondary | ICD-10-CM | POA: Diagnosis not present

## 2023-05-29 ENCOUNTER — Ambulatory Visit: Attending: Cardiology

## 2023-05-29 ENCOUNTER — Other Ambulatory Visit: Payer: Self-pay | Admitting: Cardiology

## 2023-05-29 DIAGNOSIS — R0683 Snoring: Secondary | ICD-10-CM

## 2023-05-29 DIAGNOSIS — D0461 Carcinoma in situ of skin of right upper limb, including shoulder: Secondary | ICD-10-CM | POA: Diagnosis not present

## 2023-05-29 NOTE — Telephone Encounter (Signed)
 Prescription sent to pharmacy.

## 2023-05-29 NOTE — Procedures (Signed)
   SLEEP STUDY REPORT Patient Information Study Date: 05/24/2023 Patient Name: Chad Byrd Patient ID: [id number] Birth Date: 1929-01-20 Age: 89 Gender: BMI: 27.4 (W=185 lb, H=5' 9'') Referring Physician: Ralene Burger, MD  TEST DESCRIPTION: Home sleep apnea testing was completed using the WatchPat, a Type 1 device, utilizing peripheral arterial tonometry (PAT), chest movement, actigraphy, pulse oximetry, pulse rate, body position and snore. AHI was calculated with apnea and hypopnea using valid sleep time as the denominator. RDI includes apneas, hypopneas, and RERAs. The data acquired and the scoring of sleep and all associated events were performed in accordance with the recommended standards and specifications as outlined in the AASM Manual for the Scoring of Sleep and Associated Events 2.2.0 (2015).  FINDINGS:  1. Mild Obstructive Sleep Apnea with AHI 10.5/hr.  2. No Central Sleep Apnea with pAHIc 3.5/hr.  3. Oxygen desaturations as low as 80%.  4. Mild to moderate snoring was present. O2 sats were < 88% for 0.4 min.  5. Total sleep time was 4 hrs and 52 min.  6. 15.7% of total sleep time was spent in REM sleep.  7. Normal sleep onset latency at 18 min.  8. Shortened REM sleep onset latency at 65 min.  9. Total awakenings were 8. 10. Arrhythmia detection: Suggestive of possible brief atrial fibrillation lasting 28 seconds. This is not diagnostic and further testing with outpatient telemetry monitoring is recommended.  DIAGNOSIS: Mild Obstructive Sleep Apnea (G47.33)  RECOMMENDATIONS: 1. Clinical correlation of these findings is necessary. The decision to treat obstructive sleep apnea (OSA) is usually based on the presence of apnea symptoms or the presence of associated medical conditions such as Hypertension, Congestive Heart Failure, Atrial Fibrillation or Obesity. The most common symptoms of OSA are snoring, gasping for breath while sleeping, daytime sleepiness and  fatigue. 2. Initiating apnea therapy is recommended given the presence of symptoms and/or associated conditions. Recommend proceeding with one of the following:  a. Auto-CPAP therapy with a pressure range of 5-20cm H2O.  b. An oral appliance (OA) that can be obtained from certain dentists with expertise in sleep medicine. These are primarily of use in non-obese patients with mild and moderate disease.  c. An ENT consultation which may be useful to look for specific causes of obstruction and possible treatment options.  d. If patient is intolerant to PAP therapy, consider referral to ENT for evaluation for hypoglossal nerve stimulator. 3. Close follow-up is necessary to ensure success with CPAP or oral appliance therapy for maximum benefit . 4. A follow-up oximetry study on CPAP is recommended to assess the adequacy of therapy and determine the need for supplemental oxygen or the potential need for Bi-level therapy. An arterial blood gas to determine the adequacy of baseline ventilation and oxygenation should also be considered. 5. Healthy sleep recommendations include: adequate nightly sleep (normal 7-9 hrs/night), avoidance of caffeine after noon and alcohol near bedtime, and maintaining a sleep environment that is cool, dark and quiet. 6. Weight loss for overweight patients is recommended. Even modest amounts of weight loss can significantly improve the severity of sleep apnea. 7. Snoring recommendations include: weight loss where appropriate, side sleeping, and avoidance of alcohol before bed. 8. Operation of motor vehicle should be avoided when sleepy.  Signature: Gaylyn Keas, MD; [facility]; Diplomat, American Board of Sleep Medicine Electronically Signed: 05/29/2023 8:09:45 PM

## 2023-05-30 ENCOUNTER — Telehealth: Payer: Self-pay

## 2023-05-30 NOTE — Telephone Encounter (Signed)
 Notified patient's wife, per DPR, of sleep study result and recommendations. Patient's wife stated patient is willing to try CPAP Therapy but is interested in [place] device. Message sent to provider for advice.

## 2023-05-30 NOTE — Telephone Encounter (Signed)
-----   Message from Gaylyn Keas sent at 05/29/2023  8:11 PM EDT ----- Please let patient know that they have sleep apnea and recommend treating with CPAP.  Please order an auto CPAP from 4-15cm H2O with heated humidity and mask of choice.  Order overnight pulse ox on CPAP.  Followup with me in 6 weeks.

## 2023-05-30 NOTE — Addendum Note (Signed)
 Addended by: Maceo Sax on: 05/30/2023 10:41 AM   Modules accepted: Orders

## 2023-05-31 NOTE — Telephone Encounter (Signed)
 Patient's wife is following up regarding Inspire device inquiry. Please advise.

## 2023-06-01 NOTE — Telephone Encounter (Signed)
 Mychart message sent to patient.

## 2023-06-01 NOTE — Telephone Encounter (Signed)
 Per Dr Micael Adas,  No he is not eligible for the inspire device his apneas are not bad enough.

## 2023-06-04 ENCOUNTER — Encounter: Payer: Self-pay | Admitting: Cardiology

## 2023-06-04 DIAGNOSIS — I10 Essential (primary) hypertension: Secondary | ICD-10-CM

## 2023-06-04 DIAGNOSIS — I2510 Atherosclerotic heart disease of native coronary artery without angina pectoris: Secondary | ICD-10-CM

## 2023-06-04 DIAGNOSIS — G4733 Obstructive sleep apnea (adult) (pediatric): Secondary | ICD-10-CM

## 2023-06-04 DIAGNOSIS — R0683 Snoring: Secondary | ICD-10-CM

## 2023-06-04 NOTE — Telephone Encounter (Signed)
 Pt requesting a c/b in regards to a device.

## 2023-07-20 ENCOUNTER — Encounter: Payer: Self-pay | Admitting: Cardiology

## 2023-07-25 DIAGNOSIS — R413 Other amnesia: Secondary | ICD-10-CM | POA: Diagnosis not present

## 2023-07-25 DIAGNOSIS — M1611 Unilateral primary osteoarthritis, right hip: Secondary | ICD-10-CM | POA: Diagnosis not present

## 2023-07-25 DIAGNOSIS — N183 Chronic kidney disease, stage 3 unspecified: Secondary | ICD-10-CM | POA: Diagnosis not present

## 2023-08-01 DIAGNOSIS — M7061 Trochanteric bursitis, right hip: Secondary | ICD-10-CM | POA: Diagnosis not present

## 2023-08-15 DIAGNOSIS — C61 Malignant neoplasm of prostate: Secondary | ICD-10-CM | POA: Diagnosis not present

## 2023-08-22 DIAGNOSIS — C61 Malignant neoplasm of prostate: Secondary | ICD-10-CM | POA: Diagnosis not present

## 2023-08-22 DIAGNOSIS — N312 Flaccid neuropathic bladder, not elsewhere classified: Secondary | ICD-10-CM | POA: Diagnosis not present

## 2023-08-27 ENCOUNTER — Ambulatory Visit: Admitting: Cardiology

## 2023-08-29 ENCOUNTER — Ambulatory Visit

## 2023-08-29 VITALS — BP 110/58 | HR 62 | Ht 69.0 in | Wt 179.8 lb

## 2023-08-29 DIAGNOSIS — E782 Mixed hyperlipidemia: Secondary | ICD-10-CM | POA: Diagnosis not present

## 2023-08-29 DIAGNOSIS — I2510 Atherosclerotic heart disease of native coronary artery without angina pectoris: Secondary | ICD-10-CM | POA: Diagnosis not present

## 2023-08-29 DIAGNOSIS — R011 Cardiac murmur, unspecified: Secondary | ICD-10-CM

## 2023-08-29 DIAGNOSIS — I7121 Aneurysm of the ascending aorta, without rupture: Secondary | ICD-10-CM | POA: Insufficient documentation

## 2023-08-29 DIAGNOSIS — M67951 Unspecified disorder of synovium and tendon, right thigh: Secondary | ICD-10-CM | POA: Diagnosis not present

## 2023-08-29 DIAGNOSIS — M7061 Trochanteric bursitis, right hip: Secondary | ICD-10-CM | POA: Diagnosis not present

## 2023-08-29 HISTORY — DX: Cardiac murmur, unspecified: R01.1

## 2023-08-29 NOTE — Patient Instructions (Signed)
 Medication Instructions:  Your physician recommends that you continue on your current medications as directed. Please refer to the Current Medication list given to you today.  *If you need a refill on your cardiac medications before your next appointment, please call your pharmacy*  Lab Work: Your physician recommends that you return for lab work in:   Labs: Lipids when you come back for your echo  If you have labs (blood work) drawn today and your tests are completely normal, you will receive your results only by: MyChart Message (if you have MyChart) OR A paper copy in the mail If you have any lab test that is abnormal or we need to change your treatment, we will call you to review the results.  Testing/Procedures: Your physician has requested that you have an echocardiogram. Echocardiography is a painless test that uses sound waves to create images of your heart. It provides your doctor with information about the size and shape of your heart and how well your heart's chambers and valves are working. This procedure takes approximately one hour. There are no restrictions for this procedure. Please do NOT wear cologne, perfume, aftershave, or lotions (deodorant is allowed). Please arrive 15 minutes prior to your appointment time.  Please note: We ask at that you not bring children with you during ultrasound (echo/ vascular) testing. Due to room size and safety concerns, children are not allowed in the ultrasound rooms during exams. Our front office staff cannot provide observation of children in our lobby area while testing is being conducted. An adult accompanying a patient to their appointment will only be allowed in the ultrasound room at the discretion of the ultrasound technician under special circumstances. We apologize for any inconvenience.   Follow-Up: At [facility], you and your health needs are our priority.  As part of our continuing mission to provide you with  exceptional heart care, our providers are all part of one team.  This team includes your primary Cardiologist (physician) and Advanced Practice Providers or APPs (Physician Assistants and Nurse Practitioners) who all work together to provide you with the care you need, when you need it.  Your next appointment:   6 month(s)  Provider:   Alean Kobus, MD    We recommend signing up for the patient portal called MyChart.  Sign up information is provided on this After Visit Summary.  MyChart is used to connect with patients for Virtual Visits (Telemedicine).  Patients are able to view lab/test results, encounter notes, upcoming appointments, etc.  Non-urgent messages can be sent to your provider as well.   To learn more about what you can do with MyChart, go to [email or web address].   Other Instructions None

## 2023-08-29 NOTE — Assessment & Plan Note (Addendum)
 Proceed with interval assessment with transthoracic echocardiogram to rule out any significant valve abnormalities.

## 2023-08-29 NOTE — Assessment & Plan Note (Signed)
 Good baseline functional status stable no anginal symptoms. Continue with aspirin  81 mg once daily. On antianginal regimen with Imdur  30 mg once daily and metoprolol  XL 12.5 mg once daily.  Tolerating well.  Has sublingual nitroglycerin  to use as needed.

## 2023-08-29 NOTE — Assessment & Plan Note (Signed)
 Initially identified on CT chest 2018 measuring 4.4 cm. Has had follow-up CT chest imaging almost annually until 2022 April size relatively stable at 4.6 cm. No further imaging recommended subsequently. They are agreeable with this plan.

## 2023-08-29 NOTE — Progress Notes (Signed)
 Cardiology Consultation:    Date:  08/29/2023   ID:  Chad Byrd, DOB 06-19-1928, MRN [id number]  PCP:  Chad Lauraine DASEN, NP  Cardiologist:  Chad SAUNDERS Samiksha Pellicano, MD   Referring MD: Chad Lauraine DASEN, NP   Chief Complaint  Patient presents with   Follow-up     ASSESSMENT AND PLAN:   Mr Chad Byrd 89 year old male history of CAD with cardiac catheter December 2015 showing moderate stenosis of LAD and RCA severe proximal LCx-OM1 bifurcation lesion disease felt high risk for intervention and treated medically, prior incidental finding of small peripheral right pulmonary artery PE on CT chest October 2020, hyperlipidemia, statin intolerance but tolerating bempedoic acid , ascending aorta aneurysm 4.6 cm on CT chest 05/18/2020 [given relatively stable measuring 4.4 cm February 2018 by CT chest and further imaging studies in 2019, 2020, 2021 decided on no further follow-up imaging after 2022], CVA.   Here for the follow-up visit accompanied by his partner.  No active symptoms.  Problem List Items Addressed This Visit     Coronary artery disease involving native coronary artery of native heart without angina pectoris - Primary   Good baseline functional status stable no anginal symptoms. Continue with aspirin  81 mg once daily. On antianginal regimen with Imdur  30 mg once daily and metoprolol  XL 12.5 mg once daily.  Tolerating well.  Has sublingual nitroglycerin  to use as needed.       Thoracic aortic aneurysm without rupture (HCC)   Initially identified on CT chest 2018 measuring 4.4 cm. Has had follow-up CT chest imaging almost annually until 2022 April size relatively stable at 4.6 cm. No further imaging recommended subsequently. They are agreeable with this plan.      Relevant Orders   ECHOCARDIOGRAM COMPLETE   Hyperlipidemia (Chronic)   Last lipid panel January 2024 LDL not ideal. Remains on bempedoic acid  180 mg once daily.  Will review fasting lipid panel at next visit.        Relevant Orders   Lipid Profile   Heart murmur, systolic   Proceed with interval assessment with transthoracic echocardiogram to rule out any significant valve abnormalities.      Relevant Orders   ECHOCARDIOGRAM COMPLETE   Return to clinic tentatively in 6 months.   History of Present Illness:    Chad Byrd is a 89 y.o. male who is being seen today for follow-up visit. PCP is Chad Lauraine DASEN, NP. Last visit at our office was with Dr. Monetta 02/26/2023.  Here for the visit today accompanied by his partner and they have been together for 46 years.  She helps manage his medications.  Has history of CAD with cardiac catheter December 2015 showing moderate stenosis of LAD and RCA severe proximal LCx-OM1 bifurcation lesion disease felt high risk for intervention and treated medically, prior incidental finding of small peripheral right pulmonary artery PE on CT chest October 2020, hyperlipidemia, statin intolerance but tolerating bempedoic acid , ascending aorta aneurysm 4.6 cm on CT chest 05/18/2020 [given relatively stable measuring 4.4 cm February 2018 by CT chest and further imaging studies in 2019, 2020, 2021 decided on no further follow-up imaging after 2022], CVA.  No chest pain, shortness of breath, orthopnea, paroxysmal nocturnal dyspnea. No lightheadedness, dizziness or syncopal episodes. Has been compliant with his medications. Denies any blood in urine or stools.  Prior EKG to review is from 02/14/2018 for sinus rhythm with PR interval 73 ms.  Last lipid panel reviewed from 02/14/2022 total Vaslow 172, triglycerides 160, HDL 43 and  LDL 101. Not on lipid-lowering therapy and hence we will hold off on any further rechecks  Past Medical History:  Diagnosis Date   Adverse reaction to drug that acts primarily on skin 10/09/2018   CAD (coronary artery disease)    Cataract    Coronary artery disease involving native coronary artery of native heart without angina pectoris  09/29/2015   HTN (hypertension) 01/10/2017   Hyperlipidemia    Hypertension    Left sided numbness 01/10/2017   Primary osteoarthritis of both feet 05/28/2019   Primary osteoarthritis of both hands 05/28/2019   Pruritus 02/05/2019   Statin intolerance 03/07/2016   Thoracic aortic aneurysm without rupture (HCC) 09/29/2015   TIA (transient ischemic attack) 01/10/2017   IMPRESSION:     Chad Tonita POUR, DO  Neurology  Transient ischemic attack manifesting with left arm > face numbness on 04/08/2018.  He has not had these symptoms recur or new neurological symptoms.  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  With focal sensory symptoms, purely lacunar syndrome is also considered, however his blood p    Past Surgical History:  Procedure Laterality Date   HERNIA REPAIR  1982   RECTAL SURGERY  mid 1990s    Current Medications: Current Meds  Medication Sig   aspirin  EC 81 MG tablet Take 1 tablet (81 mg total) by mouth daily.   Bempedoic Acid  (NEXLETOL ) 180 MG TABS Take 1 tablet (180 mg total) by mouth daily.   Calcium  Carbonate Antacid (CALCIUM  CARBONATE PO) Take by mouth.   cloNIDine  (CATAPRES ) 0.1 MG tablet Take 1 tablet (0.1 mg total) by mouth daily. For systolic blood pressure greater than 175 mm Hg   diphenhydrAMINE  (BENADRYL  ALLERGY) 25 MG tablet Take 25 mg by mouth as needed for itching.   finasteride (PROSCAR) 5 MG tablet Take 5 mg by mouth daily.    isosorbide  mononitrate (IMDUR ) 30 MG 24 hr tablet TAKE 1 TABLET(30 MG) BY MOUTH DAILY   meclizine  (ANTIVERT ) 25 MG tablet Take 25 mg by mouth 3 (three) times daily as needed for dizziness.   metoprolol  succinate (TOPROL -XL) 25 MG 24 hr tablet Take 0.5 tablets (12.5 mg total) by mouth daily.   Multiple Vitamin (MULTI-VITAMIN) tablet Take by mouth.   nitroGLYCERIN  (NITROSTAT ) 0.4 MG SL tablet Place 1 tablet (0.4 mg total) under the tongue every 5 (five) minutes as needed for  up to 25 doses for chest pain.     Allergies:   Atorvastatin, Pravastatin , Simvastatin, Statins, and Eliquis  [apixaban ]   Social History   Socioeconomic History   Marital status: Divorced    Spouse name: Dene Caldron = sign. 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 He lives with his girlfriend of 41 years, [name].    They farm and raise [employer].   Social Drivers of Dispensing optician  Resource Strain: Not on file  Food Insecurity: Not on file  Transportation Needs: Not on file  Physical Activity: Not on file  Stress: Not on file  Social Connections: Not on file     Family History: The patient's family history includes Alzheimer's disease in his brother; Lung cancer in his sister. ROS:   Please see the history of present illness.    All 14 point review of systems negative except as described per history of present illness.  EKGs/Labs/Other Studies Reviewed:    The following studies were reviewed today:   EKG:       Recent Labs: No results found for requested labs within last 365 days.  Recent Lipid Panel    Component Value Date/Time   CHOL 172 02/14/2022 1435   CHOL 203 (H) 09/29/2015 1039   TRIG 160 (H) 02/14/2022 1435   TRIG 127 09/29/2015 1039   HDL 43 02/14/2022 1435   HDL 36 (L) 09/29/2015 1039   CHOLHDL 4.0 02/14/2022 1435   CHOLHDL 4.0 01/11/2017 0444   VLDL 30 01/11/2017 0444   LDLCALC 101 (H) 02/14/2022 1435   LDLCALC 142 (H) 09/29/2015 1039    Physical  Exam:    VS:  BP (!) 110/58 (BP Location: Left Arm, Patient Position: Sitting)   Pulse 62   Ht 5' 9 (1.753 m)   Wt 179 lb 12.8 oz (81.6 kg)   SpO2 95%   BMI 26.55 kg/m     Wt Readings from Last 3 Encounters:  08/29/23 179 lb 12.8 oz (81.6 kg)  02/26/23 185 lb (83.9 kg)  02/14/22 192 lb (87.1 kg)     GENERAL:  Well nourished, well developed in no acute distress NECK: No JVD; No carotid bruits CARDIAC: RRR, S1 and S2 present, 3/6 systolic murmur best heard right upper sternal border. CHEST:  Clear to auscultation without rales, wheezing or rhonchi  Extremities: No pitting pedal edema. Pulses bilaterally symmetric with radial 2+ and dorsalis pedis 2+ NEUROLOGIC:  Alert and oriented x 3  Medication Adjustments/Labs and Tests Ordered: Current medicines are reviewed at length with the patient today.  Concerns regarding medicines are outlined above.  Orders Placed This Encounter  Procedures   Lipid Profile   ECHOCARDIOGRAM COMPLETE   No orders of the defined types were placed in this encounter.   Signed, Tonatiuh Mallon reddy Kasen Sako, MD, MPH, [facility]. 08/29/2023 2:33 PM    [facility]

## 2023-08-29 NOTE — Assessment & Plan Note (Signed)
 Last lipid panel January 2024 LDL not ideal. Remains on bempedoic acid  180 mg once daily.  Will review fasting lipid panel at next visit.

## 2023-09-14 ENCOUNTER — Ambulatory Visit

## 2023-09-14 DIAGNOSIS — I7121 Aneurysm of the ascending aorta, without rupture: Secondary | ICD-10-CM | POA: Insufficient documentation

## 2023-09-14 DIAGNOSIS — E782 Mixed hyperlipidemia: Secondary | ICD-10-CM | POA: Diagnosis not present

## 2023-09-14 DIAGNOSIS — R011 Cardiac murmur, unspecified: Secondary | ICD-10-CM | POA: Insufficient documentation

## 2023-09-14 LAB — ECHOCARDIOGRAM COMPLETE
AR max vel: 2.07 cm2
AV Area VTI: 2.1 cm2
AV Area mean vel: 2 cm2
AV Mean grad: 5.2 mmHg
AV Peak grad: 10 mmHg
Ao pk vel: 1.58 m/s
Area-P 1/2: 1.96 cm2
MV VTI: 2.29 cm2
P 1/2 time: 631 ms
S' Lateral: 2.7 cm

## 2023-09-15 LAB — LIPID PANEL
Chol/HDL Ratio: 3.2 ratio (ref 0.0–5.0)
Cholesterol, Total: 140 mg/dL (ref 100–199)
HDL: 44 mg/dL (ref >39)
LDL Chol Calc (NIH): 81 mg/dL (ref 0–99)
Triglycerides: 75 mg/dL (ref 0–149)
VLDL Cholesterol Cal: 15 mg/dL (ref 5–40)

## 2023-09-17 ENCOUNTER — Ambulatory Visit: Payer: Self-pay

## 2023-09-25 ENCOUNTER — Telehealth: Payer: Self-pay

## 2023-09-25 NOTE — Telephone Encounter (Signed)
 Received the following message from Dr. Liborio stating that the patient had not read their MyChart message below:  Blood work shows improvement in cholesterol levels in comparison to a year ago.  Attempted to call the patient. Patient did not answer the phone and there was no voice-mail so unable to leave a message.

## 2023-10-22 DIAGNOSIS — Z79899 Other long term (current) drug therapy: Secondary | ICD-10-CM | POA: Diagnosis not present

## 2023-10-22 DIAGNOSIS — H9190 Unspecified hearing loss, unspecified ear: Secondary | ICD-10-CM | POA: Diagnosis not present

## 2023-10-22 DIAGNOSIS — I10 Essential (primary) hypertension: Secondary | ICD-10-CM | POA: Diagnosis not present

## 2023-10-22 DIAGNOSIS — E7800 Pure hypercholesterolemia, unspecified: Secondary | ICD-10-CM | POA: Diagnosis not present

## 2023-10-22 DIAGNOSIS — Z0000 Encounter for general adult medical examination without abnormal findings: Secondary | ICD-10-CM | POA: Diagnosis not present

## 2023-10-22 DIAGNOSIS — R4189 Other symptoms and signs involving cognitive functions and awareness: Secondary | ICD-10-CM | POA: Diagnosis not present

## 2023-10-22 DIAGNOSIS — Z23 Encounter for immunization: Secondary | ICD-10-CM | POA: Diagnosis not present

## 2023-10-22 DIAGNOSIS — R7303 Prediabetes: Secondary | ICD-10-CM | POA: Diagnosis not present

## 2023-10-22 DIAGNOSIS — N183 Chronic kidney disease, stage 3 unspecified: Secondary | ICD-10-CM | POA: Diagnosis not present

## 2023-11-12 DIAGNOSIS — Z85820 Personal history of malignant melanoma of skin: Secondary | ICD-10-CM | POA: Diagnosis not present

## 2023-11-12 DIAGNOSIS — L820 Inflamed seborrheic keratosis: Secondary | ICD-10-CM | POA: Diagnosis not present

## 2023-11-12 DIAGNOSIS — L821 Other seborrheic keratosis: Secondary | ICD-10-CM | POA: Diagnosis not present

## 2023-11-12 DIAGNOSIS — L578 Other skin changes due to chronic exposure to nonionizing radiation: Secondary | ICD-10-CM | POA: Diagnosis not present

## 2023-11-16 DIAGNOSIS — H6123 Impacted cerumen, bilateral: Secondary | ICD-10-CM | POA: Diagnosis not present

## 2023-11-16 DIAGNOSIS — H903 Sensorineural hearing loss, bilateral: Secondary | ICD-10-CM | POA: Diagnosis not present

## 2023-11-20 DIAGNOSIS — M67951 Unspecified disorder of synovium and tendon, right thigh: Secondary | ICD-10-CM | POA: Diagnosis not present

## 2023-11-20 DIAGNOSIS — M7061 Trochanteric bursitis, right hip: Secondary | ICD-10-CM | POA: Diagnosis not present

## 2024-01-14 ENCOUNTER — Telehealth: Payer: Self-pay | Admitting: Neurology

## 2024-01-14 NOTE — Telephone Encounter (Signed)
 Request to cx, no longer needed

## 2024-01-16 DIAGNOSIS — M7061 Trochanteric bursitis, right hip: Secondary | ICD-10-CM | POA: Diagnosis not present

## 2024-01-16 DIAGNOSIS — M1611 Unilateral primary osteoarthritis, right hip: Secondary | ICD-10-CM | POA: Diagnosis not present

## 2024-01-16 DIAGNOSIS — Z6826 Body mass index (BMI) 26.0-26.9, adult: Secondary | ICD-10-CM | POA: Diagnosis not present

## 2024-01-21 ENCOUNTER — Ambulatory Visit: Admitting: Neurology

## 2024-02-21 ENCOUNTER — Other Ambulatory Visit: Payer: Self-pay | Admitting: Cardiology

## 2024-02-25 ENCOUNTER — Ambulatory Visit

## 2024-03-06 ENCOUNTER — Ambulatory Visit

## 2024-03-06 VITALS — BP 124/66 | HR 71 | Ht 69.0 in | Wt 176.0 lb

## 2024-03-06 DIAGNOSIS — I10 Essential (primary) hypertension: Secondary | ICD-10-CM | POA: Diagnosis not present

## 2024-03-06 DIAGNOSIS — I2510 Atherosclerotic heart disease of native coronary artery without angina pectoris: Secondary | ICD-10-CM | POA: Diagnosis not present

## 2024-03-06 DIAGNOSIS — E782 Mixed hyperlipidemia: Secondary | ICD-10-CM

## 2024-03-06 DIAGNOSIS — I7121 Aneurysm of the ascending aorta, without rupture: Secondary | ICD-10-CM

## 2024-03-06 MED ORDER — FUROSEMIDE 20 MG PO TABS: 20.0000 mg | ORAL_TABLET | Freq: Every day | ORAL | 1 refills | Status: AC | PRN

## 2024-03-06 NOTE — Patient Instructions (Addendum)
 Medication Instructions:   START: Lasix  20mg  1 tablet daily as needed for weight gain of 3 pounds in one day or 5 pounds in 1 week   Lab Work: None Ordered If you have labs (blood work) drawn today and your tests are completely normal, you will receive your results only by: [employer] (if you have MyChart) OR A paper copy in the mail If you have any lab test that is abnormal or we need to change your treatment, we will call you to review the results.   Testing/Procedures: None Ordered   Follow-Up: At [facility], you and your health needs are our priority.  As part of our continuing mission to provide you with exceptional heart care, we have created designated Provider Care Teams.  These Care Teams include your primary Cardiologist (physician) and Advanced Practice Providers (APPs -  Physician Assistants and Nurse Practitioners) who all work together to provide you with the care you need, when you need it.  We recommend signing up for the patient portal called MyChart.  Sign up information is provided on this After Visit Summary.  MyChart is used to connect with patients for Virtual Visits (Telemedicine).  Patients are able to view lab/test results, encounter notes, upcoming appointments, etc.  Non-urgent messages can be sent to your provider as well.   To learn more about what you can do with MyChart, go to [email or web address].    Your next appointment:   6 month(s)  The format for your next appointment:   In Person  Provider:   Alean Kobus, MD    Other Instructions NA

## 2024-03-06 NOTE — Assessment & Plan Note (Signed)
 Last lipid panel to review is from 09/14/2023 total cholesterol 140, triglycerides 75, HDL 44 and LDL 81.  Intolerant to statins. Continue Nexletol  180 mg once daily.

## 2024-03-06 NOTE — Assessment & Plan Note (Signed)
 Well-controlled. Mild bilateral lower extremity edema likely related to underlying diastolic dysfunction. He also does consume significant salty food. No significant weight gain, stable around 176 pounds since his last visit with me.  Recommended cutting down salt consumption in his diet. Will prescribe Lasix  20 mg to use as needed for any significant weight gain over 3 pounds within a day or 5 pounds within a week.  Continue his current medications metoprolol  XL 25 mg once daily and Imdur  30 mg once daily. Has clonidine  to take as needed but has not required.

## 2024-03-06 NOTE — Assessment & Plan Note (Signed)
 Functional status limited due to elderly age, uses a cane to ambulate. No active cardiac symptoms such as chest pain or shortness of breath, orthopnea or paroxysmal nocturnal dyspnea.  Continue aspirin  81 mg once daily Has sublingual nitroglycerin  to use as needed. Remains on low-dose metoprolol  XL and Imdur .

## 2024-03-06 NOTE — Progress Notes (Signed)
 "  Cardiology Consultation:    Date:  03/06/2024   ID:  Chad Byrd, DOB Sep 15, 1928, MRN [id number]  PCP:  Chad Lauraine DASEN, NP  Cardiologist:  Chad SAUNDERS Nalin Mazzocco, MD   Referring MD: Chad Lauraine DASEN, NP   Chief Complaint  Patient presents with   Follow-up     ASSESSMENT AND PLAN:   Chad Byrd 89 year old male history of CAD [cardiac cath December 2015 moderate stenosis of LAD and RCA severe proximal LCx-OM1 bifurcation lesion disease felt high risk for intervention and treated medically], prior incidental finding of small peripheral right pulmonary artery PE on CT chest October 2020, hyperlipidemia, statin intolerance [currently on bempedoic acid ], ascending aorta aneurysm 4.6 cm on CT chest 05/18/2020 [given relatively stable measuring 4.4 cm February 2018 by CT chest and further imaging studies in 2019, 2020, 2021 decided on no further follow-up imaging after 2022], CVA, mild memory deficits.  Echocardiogram 09/14/2023 noted normal biventricular function with LVEF 60 to 65%, mild LVH, grade 1 diastolic dysfunction, mild Chad mild aortic insufficiency, ascending aorta measuring 4.6 cm.   Here for routine follow-up visit. Problem List Items Addressed This Visit       Cardiovascular and Mediastinum   Coronary artery disease involving native coronary artery of native heart without angina pectoris - Primary   Functional status limited due to elderly age, uses a cane to ambulate. No active cardiac symptoms such as chest pain or shortness of breath, orthopnea or paroxysmal nocturnal dyspnea.  Continue aspirin  81 mg once daily Has sublingual nitroglycerin  to use as needed. Remains on low-dose metoprolol  XL and Imdur .       Relevant Medications   furosemide  (LASIX ) 20 MG tablet   Thoracic aortic aneurysm without rupture   Initially identified on CT chest 2018 measuring 4.4 cm. Has had follow-up CT chest imaging almost annually until April 2022, size relatively stable at 4.6  cm. Recent echocardiogram 09/14/2023 ascending aorta measured 4.60 m.  No further imaging recommended at this time. They are agreeable with this plan.      Relevant Medications   furosemide  (LASIX ) 20 MG tablet   HTN (hypertension) (Chronic)   Well-controlled. Mild bilateral lower extremity edema likely related to underlying diastolic dysfunction. He also does consume significant salty food. No significant weight gain, stable around 176 pounds since his last visit with me.  Recommended cutting down salt consumption in his diet. Will prescribe Lasix  20 mg to use as needed for any significant weight gain over 3 pounds within a day or 5 pounds within a week.  Continue his current medications metoprolol  XL 25 mg once daily and Imdur  30 mg once daily. Has clonidine  to take as needed but has not required.      Relevant Medications   furosemide  (LASIX ) 20 MG tablet   Other Relevant Orders   EKG 12-Lead (Completed)     Other   Hyperlipidemia (Chronic)   Last lipid panel to review is from 09/14/2023 total cholesterol 140, triglycerides 75, HDL 44 and LDL 81.  Intolerant to statins. Continue Nexletol  180 mg once daily.       Relevant Medications   furosemide  (LASIX ) 20 MG tablet   Return to clinic tentatively in 6 months   History of Present Illness:    Chad Byrd is a 89 y.o. male who is being seen today for follow-up visit. PCP is Chad Lauraine DASEN, NP.  Last visit with me in the office was 08/29/2023.  Here for the visit today accompanied by his partner  and they have been together for 46 years. She helps manage his medications.  [place]  He ambulates using a cane.  Currently in winters, noted to walking indoors.  Has a history of CAD [cardiac cath December 2015 moderate stenosis of LAD and RCA severe proximal LCx-OM1 bifurcation lesion disease felt high risk for intervention and treated medically], prior incidental finding of small peripheral right pulmonary  artery PE on CT chest October 2020, hyperlipidemia, statin intolerance [currently on bempedoic acid ], ascending aorta aneurysm 4.6 cm on CT chest 05/18/2020 [given relatively stable measuring 4.4 cm February 2018 by CT chest and further imaging studies in 2019, 2020, 2021 decided on no further follow-up imaging after 2022], CVA, mild memory deficits.  Echocardiogram 09/14/2023 noted normal biventricular function with LVEF 60 to 65%, mild LVH, grade 1 diastolic dysfunction, mild Chad mild aortic insufficiency, ascending aorta measuring 4.6 cm.  Last lipid panel to review is from 09/14/2023 total cholesterol 140, triglycerides 75, HDL 44 and LDL 81.  EKG in the clinic today shows sinus rhythm heart rate 71/min, PR interval 180 ms, QRS duration 92 ms, left axis QTc 441 ms, no changes suggestive of ischemia.  Mentions overall has been doing well.  He denies any symptoms of chest pain, shortness of breath, orthopnea. He does have mild bilateral lower extremity edema more towards the end of the day or standing up for extended duration. Relates with having his feet elevated. Denies any pain in the legs.  No ulcers on the feet. Denies any falls. No syncopal or near syncopal episodes.  Good compliance with medications.  Recently with memory issues he was started on donepezil  Past Medical History:  Diagnosis Date   Adverse reaction to drug that acts primarily on skin 10/09/2018   CAD (coronary artery disease)    Cataract    Coronary artery disease involving native coronary artery of native heart without angina pectoris 09/29/2015   Heart murmur, systolic 08/29/2023   HTN (hypertension) 01/10/2017   Hyperlipidemia    Hypertension    Left sided numbness 01/10/2017   Primary osteoarthritis of both feet 05/28/2019   Primary osteoarthritis of both hands 05/28/2019   Pruritus 02/05/2019   Statin intolerance 03/07/2016   Thoracic aortic aneurysm without rupture 09/29/2015   TIA (transient ischemic attack)  01/10/2017   IMPRESSION:     Chad Tonita POUR, DO  Neurology  Transient ischemic attack manifesting with left arm > face numbness on 04/08/2018.  He has not had these symptoms recur or new neurological symptoms.  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  With focal sensory symptoms, purely lacunar syndrome is also considered, however his blood p    Past Surgical History:  Procedure Laterality Date   HERNIA REPAIR  1982   RECTAL SURGERY  mid 1990s    Current Medications: Active Medications[1]   Allergies:   Atorvastatin, Pravastatin , Simvastatin, Statins, and Eliquis  Britannia.bumpers ]   Social History   Socioeconomic History   Marital status: Divorced    Spouse name: Dene Caldron = sign. 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 He lives with his girlfriend of 41 years, [name].    They farm and raise [employer].   Social Drivers of Health   Tobacco Use: Low Risk (03/06/2024)   Patient History    Smoking Tobacco Use: Never    Smokeless Tobacco Use: Never    Passive Exposure: Not on file  Financial Resource Strain: Not on file  Food Insecurity: Not on file  Transportation Needs: Not on file  Physical Activity: Not on file  Stress: Not on file  Social Connections: Not on file  Depression (EYV7-0): Not on file  Alcohol Screen: Not on file  Housing: Not on file  Utilities: Not on file  Health Literacy: Not on file     Family  History: The patient's family history includes Alzheimer's disease in his brother; Lung cancer in his sister. ROS:   Please see the history of present illness.    All 14 point review of systems negative except as described per history of present illness.  EKGs/Labs/Other Studies Reviewed:    The following studies were reviewed today:   EKG:  EKG Interpretation Date/Time:  Thursday March 06 2024 14:04:02 EST Ventricular Rate:  71 PR Interval:  180 QRS Duration:  92 QT Interval:  406 QTC Calculation: 441 R Axis:   -46  Text Interpretation: Normal sinus rhythm Left anterior fascicular block Minimal voltage criteria for LVH, may be normal variant ( R in aVL ) Nonspecific T wave abnormality When compared with ECG of 04-May-2019 02:40, No significant change was found Confirmed by Liborio Hai reddy [phone]) on 03/06/2024 2:08:33 PM    Recent Labs: No results found for requested labs within last 365 days.  Recent Lipid Panel    Component Value Date/Time   CHOL 140 09/14/2023 1112   CHOL 203 (H) 09/29/2015 1039   TRIG 75 09/14/2023 1112   TRIG 127 09/29/2015 1039   HDL 44 09/14/2023 1112   HDL 36 (L) 09/29/2015 1039   CHOLHDL 3.2 09/14/2023 1112   CHOLHDL 4.0 01/11/2017 0444   VLDL 30 01/11/2017 0444   LDLCALC 81 09/14/2023 1112   LDLCALC 142 (H) 09/29/2015 1039    Physical Exam:    VS:  BP 124/66   Pulse 71   Ht 5' 9 (1.753 m)   Wt 176 lb (79.8 kg)   SpO2 98%   BMI 25.99 kg/m     Wt Readings from Last 3 Encounters:  03/06/24 176 lb (79.8 kg)  08/29/23 179 lb 12.8 oz (81.6 kg)  02/26/23 185 lb (83.9 kg)     GENERAL:  Well nourished, well developed in no acute distress NECK: No JVD; No carotid bruits CARDIAC: RRR, S1 and S2 present, 3/6 systolic murmur. CHEST:  Clear to auscultation without rales, wheezing or rhonchi  Extremities: 1+ bilateral pitting pedal edema extending slightly above the ankles.  Pulses bilaterally symmetric with radial 2+ and dorsalis  pedis 2+ NEUROLOGIC:  Alert and oriented x 3  Medication Adjustments/Labs and Tests Ordered: Current medicines are reviewed at length with the patient today.  Concerns regarding medicines are outlined above.  Orders Placed This Encounter  Procedures   EKG 12-Lead   Meds ordered this encounter  Medications   furosemide  (LASIX ) 20 MG tablet  Sig: Take 1 tablet (20 mg total) by mouth daily as needed (for weight gain of 3 lbs overnight or 5 lbs in 1 week).    Dispense:  3 tablet    Refill:  1    Signed, Mako Pelfrey reddy Mykira Hofmeister, MD, MPH, [facility]. 03/06/2024 2:28 PM    [facility]     [1]  Current Meds  Medication Sig   aspirin  EC 81 MG tablet Take 1 tablet (81 mg total) by mouth daily.   Bempedoic Acid  (NEXLETOL ) 180 MG TABS Take 1 tablet (180 mg total) by mouth daily.   Calcium  Carbonate Antacid (CALCIUM  CARBONATE PO) Take by mouth.   cloNIDine  (CATAPRES ) 0.1 MG tablet Take 1 tablet (0.1 mg total) by mouth daily. For systolic blood pressure greater than 175 mm Hg   diphenhydrAMINE  (BENADRYL  ALLERGY) 25 MG tablet Take 25 mg by mouth as needed for itching.   donepezil (ARICEPT) 5 MG tablet Take 5 mg by mouth at bedtime.   finasteride (PROSCAR) 5 MG tablet Take 5 mg by mouth daily.    furosemide  (LASIX ) 20 MG tablet Take 1 tablet (20 mg total) by mouth daily as needed (for weight gain of 3 lbs overnight or 5 lbs in 1 week).   isosorbide  mononitrate (IMDUR ) 30 MG 24 hr tablet Take 1 tablet (30 mg total) by mouth daily.   metoprolol  succinate (TOPROL -XL) 25 MG 24 hr tablet Take 0.5 tablets (12.5 mg total) by mouth daily.   Multiple Vitamin (MULTI-VITAMIN) tablet Take by mouth.   nitroGLYCERIN  (NITROSTAT ) 0.4 MG SL tablet Place 1 tablet (0.4 mg total) under the tongue every 5 (five) minutes as needed for up to 25 doses for chest pain.   "

## 2024-03-06 NOTE — Assessment & Plan Note (Signed)
 Initially identified on CT chest 2018 measuring 4.4 cm. Has had follow-up CT chest imaging almost annually until April 2022, size relatively stable at 4.6 cm. Recent echocardiogram 09/14/2023 ascending aorta measured 4.60 m.  No further imaging recommended at this time. They are agreeable with this plan.
